# Patient Record
Sex: Male | Born: 1955 | Race: White | Hispanic: No | Smoking: Former smoker
Health system: Southern US, Community
[De-identification: ages and names within clinical notes are randomized; demographics above are authoritative.]

## PROBLEM LIST (undated history)

## (undated) DIAGNOSIS — G4730 Sleep apnea, unspecified: Secondary | ICD-10-CM

## (undated) DIAGNOSIS — K219 Gastro-esophageal reflux disease without esophagitis: Secondary | ICD-10-CM

## (undated) DIAGNOSIS — M797 Fibromyalgia: Secondary | ICD-10-CM

## (undated) DIAGNOSIS — I471 Supraventricular tachycardia, unspecified: Secondary | ICD-10-CM

## (undated) DIAGNOSIS — I2510 Atherosclerotic heart disease of native coronary artery without angina pectoris: Secondary | ICD-10-CM

## (undated) DIAGNOSIS — E785 Hyperlipidemia, unspecified: Secondary | ICD-10-CM

## (undated) DIAGNOSIS — M542 Cervicalgia: Secondary | ICD-10-CM

## (undated) DIAGNOSIS — C649 Malignant neoplasm of unspecified kidney, except renal pelvis: Secondary | ICD-10-CM

## (undated) DIAGNOSIS — G4700 Insomnia, unspecified: Secondary | ICD-10-CM

## (undated) DIAGNOSIS — G8929 Other chronic pain: Secondary | ICD-10-CM

## (undated) DIAGNOSIS — I10 Essential (primary) hypertension: Secondary | ICD-10-CM

## (undated) DIAGNOSIS — Z8249 Family history of ischemic heart disease and other diseases of the circulatory system: Secondary | ICD-10-CM

## (undated) DIAGNOSIS — G894 Chronic pain syndrome: Secondary | ICD-10-CM

## (undated) DIAGNOSIS — Z9289 Personal history of other medical treatment: Secondary | ICD-10-CM

## (undated) HISTORY — DX: Sleep apnea, unspecified: G47.30

## (undated) HISTORY — DX: Family history of ischemic heart disease and other diseases of the circulatory system: Z82.49

## (undated) HISTORY — PX: OTHER SURGICAL HISTORY: SHX169

## (undated) HISTORY — PX: INNER EAR SURGERY: SHX679

## (undated) HISTORY — DX: Gastro-esophageal reflux disease without esophagitis: K21.9

## (undated) HISTORY — PX: NEPHRECTOMY: SHX65

## (undated) HISTORY — PX: CHOLECYSTECTOMY: SHX55

## (undated) HISTORY — DX: Essential (primary) hypertension: I10

## (undated) HISTORY — PX: NASAL SINUS SURGERY: SHX719

## (undated) HISTORY — DX: Supraventricular tachycardia: I47.1

## (undated) HISTORY — DX: Hyperlipidemia, unspecified: E78.5

## (undated) HISTORY — DX: Supraventricular tachycardia, unspecified: I47.10

## (undated) HISTORY — DX: Malignant neoplasm of unspecified kidney, except renal pelvis: C64.9

## (undated) HISTORY — PX: TONSILLECTOMY: SUR1361

## (undated) HISTORY — PX: UVULOPALATOPHARYNGOPLASTY: SHX827

---

## 2004-12-06 ENCOUNTER — Ambulatory Visit: Payer: Self-pay | Admitting: Unknown Physician Specialty

## 2005-01-24 ENCOUNTER — Encounter: Payer: Self-pay | Admitting: Family Medicine

## 2005-01-24 ENCOUNTER — Ambulatory Visit: Payer: Self-pay | Admitting: Unknown Physician Specialty

## 2006-02-10 ENCOUNTER — Inpatient Hospital Stay: Payer: Self-pay | Admitting: Internal Medicine

## 2006-02-10 ENCOUNTER — Other Ambulatory Visit: Payer: Self-pay

## 2006-02-11 ENCOUNTER — Other Ambulatory Visit: Payer: Self-pay

## 2006-02-12 ENCOUNTER — Other Ambulatory Visit: Payer: Self-pay

## 2006-07-30 ENCOUNTER — Ambulatory Visit: Payer: Self-pay | Admitting: Internal Medicine

## 2007-05-03 ENCOUNTER — Ambulatory Visit: Payer: Self-pay | Admitting: Unknown Physician Specialty

## 2007-07-29 ENCOUNTER — Encounter: Payer: Self-pay | Admitting: Family Medicine

## 2007-09-14 ENCOUNTER — Other Ambulatory Visit: Payer: Self-pay

## 2007-09-14 ENCOUNTER — Emergency Department: Payer: Self-pay | Admitting: Emergency Medicine

## 2008-03-23 ENCOUNTER — Ambulatory Visit (HOSPITAL_COMMUNITY): Payer: Self-pay | Admitting: Psychiatry

## 2008-04-06 ENCOUNTER — Ambulatory Visit (HOSPITAL_COMMUNITY): Payer: Self-pay | Admitting: Psychiatry

## 2008-05-21 ENCOUNTER — Ambulatory Visit (HOSPITAL_COMMUNITY): Payer: Self-pay | Admitting: Psychiatry

## 2008-07-13 ENCOUNTER — Ambulatory Visit: Payer: Self-pay | Admitting: Family Medicine

## 2008-07-13 DIAGNOSIS — I10 Essential (primary) hypertension: Secondary | ICD-10-CM

## 2008-07-13 DIAGNOSIS — E785 Hyperlipidemia, unspecified: Secondary | ICD-10-CM

## 2008-07-13 DIAGNOSIS — IMO0001 Reserved for inherently not codable concepts without codable children: Secondary | ICD-10-CM | POA: Insufficient documentation

## 2008-07-13 DIAGNOSIS — E1121 Type 2 diabetes mellitus with diabetic nephropathy: Secondary | ICD-10-CM

## 2008-07-13 LAB — CONVERTED CEMR LAB
Bilirubin Urine: NEGATIVE
Ketones, urine, test strip: NEGATIVE
Nitrite: NEGATIVE
Protein, U semiquant: NEGATIVE
Urobilinogen, UA: NEGATIVE
WBC Urine, dipstick: NEGATIVE

## 2008-07-14 ENCOUNTER — Encounter (INDEPENDENT_AMBULATORY_CARE_PROVIDER_SITE_OTHER): Payer: Self-pay | Admitting: *Deleted

## 2008-07-14 ENCOUNTER — Encounter: Payer: Self-pay | Admitting: Family Medicine

## 2008-07-16 ENCOUNTER — Telehealth (INDEPENDENT_AMBULATORY_CARE_PROVIDER_SITE_OTHER): Payer: Self-pay | Admitting: *Deleted

## 2008-07-16 ENCOUNTER — Telehealth: Payer: Self-pay | Admitting: Family Medicine

## 2008-07-16 ENCOUNTER — Encounter (INDEPENDENT_AMBULATORY_CARE_PROVIDER_SITE_OTHER): Payer: Self-pay | Admitting: *Deleted

## 2008-07-16 LAB — CONVERTED CEMR LAB
ALT: 23 units/L (ref 0–53)
AST: 28 units/L (ref 0–37)
CO2: 29 meq/L (ref 19–32)
CRP, High Sensitivity: 4 (ref 0.00–5.00)
Chloride: 106 meq/L (ref 96–112)
Creatinine, Ser: 0.9 mg/dL (ref 0.4–1.5)
Glucose, Bld: 86 mg/dL (ref 70–99)
Microalb Creat Ratio: 26.2 mg/g (ref 0.0–30.0)
Potassium: 4 meq/L (ref 3.5–5.1)
Rhuematoid fact SerPl-aCnc: 53.5 intl units/mL — ABNORMAL HIGH (ref 0.0–20.0)
TSH: 1.29 microintl units/mL (ref 0.35–5.50)
Total Bilirubin: 0.7 mg/dL (ref 0.3–1.2)
Total CHOL/HDL Ratio: 5.6
Triglycerides: 386 mg/dL (ref 0–149)
VLDL: 77 mg/dL — ABNORMAL HIGH (ref 0–40)

## 2008-07-17 ENCOUNTER — Telehealth: Payer: Self-pay | Admitting: Family Medicine

## 2008-07-17 ENCOUNTER — Encounter (INDEPENDENT_AMBULATORY_CARE_PROVIDER_SITE_OTHER): Payer: Self-pay | Admitting: *Deleted

## 2008-07-24 ENCOUNTER — Telehealth: Payer: Self-pay | Admitting: Family Medicine

## 2008-08-03 ENCOUNTER — Telehealth (INDEPENDENT_AMBULATORY_CARE_PROVIDER_SITE_OTHER): Payer: Self-pay | Admitting: *Deleted

## 2008-08-13 ENCOUNTER — Telehealth (INDEPENDENT_AMBULATORY_CARE_PROVIDER_SITE_OTHER): Payer: Self-pay | Admitting: *Deleted

## 2008-08-28 ENCOUNTER — Telehealth (INDEPENDENT_AMBULATORY_CARE_PROVIDER_SITE_OTHER): Payer: Self-pay | Admitting: *Deleted

## 2008-09-18 ENCOUNTER — Telehealth: Payer: Self-pay | Admitting: Family Medicine

## 2008-09-25 ENCOUNTER — Encounter (INDEPENDENT_AMBULATORY_CARE_PROVIDER_SITE_OTHER): Payer: Self-pay | Admitting: *Deleted

## 2008-10-05 ENCOUNTER — Telehealth (INDEPENDENT_AMBULATORY_CARE_PROVIDER_SITE_OTHER): Payer: Self-pay | Admitting: *Deleted

## 2008-10-06 ENCOUNTER — Telehealth (INDEPENDENT_AMBULATORY_CARE_PROVIDER_SITE_OTHER): Payer: Self-pay | Admitting: *Deleted

## 2008-10-13 ENCOUNTER — Telehealth (INDEPENDENT_AMBULATORY_CARE_PROVIDER_SITE_OTHER): Payer: Self-pay | Admitting: *Deleted

## 2008-10-29 ENCOUNTER — Telehealth: Payer: Self-pay | Admitting: Family Medicine

## 2008-11-23 ENCOUNTER — Ambulatory Visit: Payer: Self-pay | Admitting: Family Medicine

## 2008-11-24 ENCOUNTER — Telehealth (INDEPENDENT_AMBULATORY_CARE_PROVIDER_SITE_OTHER): Payer: Self-pay | Admitting: *Deleted

## 2008-11-24 ENCOUNTER — Encounter: Payer: Self-pay | Admitting: Family Medicine

## 2008-11-25 LAB — CONVERTED CEMR LAB
ALT: 29 units/L (ref 0–53)
Albumin: 4.2 g/dL (ref 3.5–5.2)
CO2: 31 meq/L (ref 19–32)
Chloride: 106 meq/L (ref 96–112)
Cholesterol: 201 mg/dL — ABNORMAL HIGH (ref 0–200)
Direct LDL: 125.9 mg/dL
Eosinophils Relative: 3.5 % (ref 0.0–5.0)
HCT: 39.3 % (ref 39.0–52.0)
HDL: 28.9 mg/dL — ABNORMAL LOW (ref >39.00)
Hemoglobin: 13.9 g/dL (ref 13.0–17.0)
Lymphocytes Relative: 57.7 % — ABNORMAL HIGH (ref 12.0–46.0)
MCHC: 35.4 g/dL (ref 30.0–36.0)
MCV: 91.3 fL (ref 78.0–100.0)
Neutrophils Relative %: 29.2 % — ABNORMAL LOW (ref 43.0–77.0)
PSA: 0.35 ng/mL (ref 0.10–4.00)
RDW: 12.9 % (ref 11.5–14.6)
Sodium: 141 meq/L (ref 135–145)
TSH: 1.48 microintl units/mL (ref 0.35–5.50)
Total Bilirubin: 0.6 mg/dL (ref 0.3–1.2)
Total CHOL/HDL Ratio: 7
Total Protein: 7.1 g/dL (ref 6.0–8.3)
Triglycerides: 398 mg/dL — ABNORMAL HIGH (ref 0.0–149.0)
Vit D, 25-Hydroxy: 28 ng/mL — ABNORMAL LOW (ref 30–89)

## 2008-11-26 ENCOUNTER — Encounter (INDEPENDENT_AMBULATORY_CARE_PROVIDER_SITE_OTHER): Payer: Self-pay | Admitting: *Deleted

## 2008-12-08 ENCOUNTER — Telehealth (INDEPENDENT_AMBULATORY_CARE_PROVIDER_SITE_OTHER): Payer: Self-pay | Admitting: *Deleted

## 2008-12-08 DIAGNOSIS — F988 Other specified behavioral and emotional disorders with onset usually occurring in childhood and adolescence: Secondary | ICD-10-CM | POA: Insufficient documentation

## 2008-12-10 ENCOUNTER — Telehealth (INDEPENDENT_AMBULATORY_CARE_PROVIDER_SITE_OTHER): Payer: Self-pay | Admitting: *Deleted

## 2008-12-11 ENCOUNTER — Encounter: Payer: Self-pay | Admitting: Family Medicine

## 2008-12-28 ENCOUNTER — Telehealth (INDEPENDENT_AMBULATORY_CARE_PROVIDER_SITE_OTHER): Payer: Self-pay | Admitting: *Deleted

## 2009-01-29 ENCOUNTER — Telehealth (INDEPENDENT_AMBULATORY_CARE_PROVIDER_SITE_OTHER): Payer: Self-pay | Admitting: *Deleted

## 2009-02-02 ENCOUNTER — Telehealth (INDEPENDENT_AMBULATORY_CARE_PROVIDER_SITE_OTHER): Payer: Self-pay | Admitting: *Deleted

## 2009-02-03 ENCOUNTER — Telehealth (INDEPENDENT_AMBULATORY_CARE_PROVIDER_SITE_OTHER): Payer: Self-pay | Admitting: *Deleted

## 2009-02-08 ENCOUNTER — Telehealth (INDEPENDENT_AMBULATORY_CARE_PROVIDER_SITE_OTHER): Payer: Self-pay | Admitting: *Deleted

## 2009-02-08 ENCOUNTER — Encounter: Payer: Self-pay | Admitting: Family Medicine

## 2009-03-01 ENCOUNTER — Ambulatory Visit: Payer: Self-pay | Admitting: Internal Medicine

## 2009-03-10 ENCOUNTER — Telehealth (INDEPENDENT_AMBULATORY_CARE_PROVIDER_SITE_OTHER): Payer: Self-pay | Admitting: *Deleted

## 2009-03-23 ENCOUNTER — Telehealth (INDEPENDENT_AMBULATORY_CARE_PROVIDER_SITE_OTHER): Payer: Self-pay | Admitting: *Deleted

## 2009-03-24 ENCOUNTER — Encounter: Payer: Self-pay | Admitting: Family Medicine

## 2009-03-24 ENCOUNTER — Telehealth: Payer: Self-pay | Admitting: Family Medicine

## 2009-04-07 ENCOUNTER — Telehealth (INDEPENDENT_AMBULATORY_CARE_PROVIDER_SITE_OTHER): Payer: Self-pay | Admitting: *Deleted

## 2009-04-30 ENCOUNTER — Telehealth: Payer: Self-pay | Admitting: Family Medicine

## 2009-05-10 ENCOUNTER — Ambulatory Visit: Payer: Self-pay | Admitting: Family Medicine

## 2009-05-10 ENCOUNTER — Telehealth (INDEPENDENT_AMBULATORY_CARE_PROVIDER_SITE_OTHER): Payer: Self-pay | Admitting: *Deleted

## 2009-05-10 ENCOUNTER — Encounter: Payer: Self-pay | Admitting: Family Medicine

## 2009-05-10 DIAGNOSIS — R079 Chest pain, unspecified: Secondary | ICD-10-CM

## 2009-05-10 DIAGNOSIS — M549 Dorsalgia, unspecified: Secondary | ICD-10-CM | POA: Insufficient documentation

## 2009-05-10 DIAGNOSIS — F411 Generalized anxiety disorder: Secondary | ICD-10-CM | POA: Insufficient documentation

## 2009-05-11 ENCOUNTER — Telehealth (INDEPENDENT_AMBULATORY_CARE_PROVIDER_SITE_OTHER): Payer: Self-pay | Admitting: *Deleted

## 2009-05-17 ENCOUNTER — Telehealth (INDEPENDENT_AMBULATORY_CARE_PROVIDER_SITE_OTHER): Payer: Self-pay | Admitting: *Deleted

## 2009-05-19 ENCOUNTER — Telehealth (INDEPENDENT_AMBULATORY_CARE_PROVIDER_SITE_OTHER): Payer: Self-pay | Admitting: *Deleted

## 2009-05-24 ENCOUNTER — Ambulatory Visit: Payer: Self-pay | Admitting: Family Medicine

## 2009-05-24 LAB — CONVERTED CEMR LAB
Protein, U semiquant: NEGATIVE
Specific Gravity, Urine: <1.005
WBC Urine, dipstick: NEGATIVE

## 2009-05-28 ENCOUNTER — Telehealth: Payer: Self-pay | Admitting: Family Medicine

## 2009-06-02 ENCOUNTER — Encounter (INDEPENDENT_AMBULATORY_CARE_PROVIDER_SITE_OTHER): Payer: Self-pay | Admitting: *Deleted

## 2009-06-02 LAB — CONVERTED CEMR LAB
ALT: 29 units/L (ref 0–53)
Albumin: 4.3 g/dL (ref 3.5–5.2)
Basophils Absolute: 0 10*3/uL (ref 0.0–0.1)
Basophils Relative: 0.1 % (ref 0.0–3.0)
Bilirubin, Direct: 0 mg/dL (ref 0.0–0.3)
Chloride: 102 meq/L (ref 96–112)
Creatinine, Ser: 1 mg/dL (ref 0.4–1.5)
Creatinine,U: 54.6 mg/dL
Eosinophils Absolute: 0.2 10*3/uL (ref 0.0–0.7)
Glucose, Bld: 87 mg/dL (ref 70–99)
HDL: 36.9 mg/dL — ABNORMAL LOW (ref >39.00)
LDL Cholesterol: 74 mg/dL (ref 0–99)
Lymphocytes Relative: 26.7 % (ref 12.0–46.0)
Neutro Abs: 6 10*3/uL (ref 1.4–7.7)
Neutrophils Relative %: 66.1 % (ref 43.0–77.0)
Platelets: 145 10*3/uL — ABNORMAL LOW (ref 150.0–400.0)
Potassium: 3.7 meq/L (ref 3.5–5.1)
RBC: 4.29 M/uL (ref 4.22–5.81)
RDW: 12.1 % (ref 11.5–14.6)
Total Bilirubin: 0.6 mg/dL (ref 0.3–1.2)
Total CHOL/HDL Ratio: 4
Total Protein: 7.3 g/dL (ref 6.0–8.3)
Triglycerides: 191 mg/dL — ABNORMAL HIGH (ref 0.0–149.0)
WBC: 9.2 10*3/uL (ref 4.5–10.5)

## 2009-06-15 ENCOUNTER — Telehealth: Payer: Self-pay | Admitting: Family Medicine

## 2009-07-13 ENCOUNTER — Telehealth: Payer: Self-pay | Admitting: Family Medicine

## 2009-07-13 ENCOUNTER — Telehealth (INDEPENDENT_AMBULATORY_CARE_PROVIDER_SITE_OTHER): Payer: Self-pay | Admitting: *Deleted

## 2009-08-13 ENCOUNTER — Telehealth (INDEPENDENT_AMBULATORY_CARE_PROVIDER_SITE_OTHER): Payer: Self-pay | Admitting: *Deleted

## 2009-08-13 ENCOUNTER — Ambulatory Visit: Payer: Self-pay | Admitting: Family Medicine

## 2009-08-13 DIAGNOSIS — G4733 Obstructive sleep apnea (adult) (pediatric): Secondary | ICD-10-CM

## 2009-08-13 DIAGNOSIS — F341 Dysthymic disorder: Secondary | ICD-10-CM

## 2009-08-17 ENCOUNTER — Telehealth (INDEPENDENT_AMBULATORY_CARE_PROVIDER_SITE_OTHER): Payer: Self-pay | Admitting: *Deleted

## 2009-08-17 LAB — CONVERTED CEMR LAB
ALT: 21 units/L (ref 0–53)
AST: 18 units/L (ref 0–37)
Alkaline Phosphatase: 60 units/L (ref 39–117)
Basophils Absolute: 0 10*3/uL (ref 0.0–0.1)
Calcium: 9.6 mg/dL (ref 8.4–10.5)
Chloride: 101 meq/L (ref 96–112)
Creatinine, Ser: 0.81 mg/dL (ref 0.40–1.50)
Eosinophils Absolute: 0.2 10*3/uL (ref 0.0–0.7)
Eosinophils Relative: 2 % (ref 0–5)
Glucose, Bld: 76 mg/dL (ref 70–99)
MCV: 89.8 fL (ref 78.0–100.0)
Microalb, Ur: 8.18 mg/dL — ABNORMAL HIGH (ref 0.00–1.89)
Monocytes Relative: 7 % (ref 3–12)
Platelets: 139 10*3/uL — ABNORMAL LOW (ref 150–400)
Potassium: 3.9 meq/L (ref 3.5–5.3)
RBC: 5.01 M/uL (ref 4.22–5.81)
RDW: 13.1 % (ref 11.5–15.5)
Sodium: 139 meq/L (ref 135–145)
Total CHOL/HDL Ratio: 6.6
Triglycerides: 565 mg/dL — ABNORMAL HIGH (ref <150)
WBC: 9.4 10*3/uL (ref 4.0–10.5)

## 2009-08-18 ENCOUNTER — Encounter (INDEPENDENT_AMBULATORY_CARE_PROVIDER_SITE_OTHER): Payer: Self-pay | Admitting: *Deleted

## 2009-09-02 ENCOUNTER — Telehealth: Payer: Self-pay | Admitting: Family Medicine

## 2009-09-10 ENCOUNTER — Telehealth: Payer: Self-pay | Admitting: Family Medicine

## 2009-09-10 ENCOUNTER — Encounter: Payer: Self-pay | Admitting: Family Medicine

## 2009-09-13 ENCOUNTER — Telehealth (INDEPENDENT_AMBULATORY_CARE_PROVIDER_SITE_OTHER): Payer: Self-pay | Admitting: *Deleted

## 2009-09-13 ENCOUNTER — Ambulatory Visit: Payer: Self-pay | Admitting: Family Medicine

## 2009-09-13 ENCOUNTER — Telehealth: Payer: Self-pay | Admitting: Family Medicine

## 2009-09-13 ENCOUNTER — Encounter (INDEPENDENT_AMBULATORY_CARE_PROVIDER_SITE_OTHER): Payer: Self-pay | Admitting: *Deleted

## 2009-09-13 DIAGNOSIS — Z8679 Personal history of other diseases of the circulatory system: Secondary | ICD-10-CM | POA: Insufficient documentation

## 2009-09-13 DIAGNOSIS — R809 Proteinuria, unspecified: Secondary | ICD-10-CM

## 2009-09-13 DIAGNOSIS — C649 Malignant neoplasm of unspecified kidney, except renal pelvis: Secondary | ICD-10-CM

## 2009-09-13 LAB — CONVERTED CEMR LAB
Ketones, urine, test strip: NEGATIVE
Nitrite: NEGATIVE
Protein, U semiquant: NEGATIVE
Urobilinogen, UA: 0.2

## 2009-09-17 ENCOUNTER — Telehealth (INDEPENDENT_AMBULATORY_CARE_PROVIDER_SITE_OTHER): Payer: Self-pay | Admitting: *Deleted

## 2009-09-20 ENCOUNTER — Telehealth (INDEPENDENT_AMBULATORY_CARE_PROVIDER_SITE_OTHER): Payer: Self-pay | Admitting: *Deleted

## 2009-09-20 ENCOUNTER — Telehealth: Payer: Self-pay | Admitting: Family Medicine

## 2009-09-27 ENCOUNTER — Ambulatory Visit: Payer: Self-pay | Admitting: Cardiology

## 2009-09-27 ENCOUNTER — Telehealth (INDEPENDENT_AMBULATORY_CARE_PROVIDER_SITE_OTHER): Payer: Self-pay | Admitting: *Deleted

## 2009-09-29 ENCOUNTER — Telehealth (INDEPENDENT_AMBULATORY_CARE_PROVIDER_SITE_OTHER): Payer: Self-pay | Admitting: *Deleted

## 2009-09-30 ENCOUNTER — Telehealth (INDEPENDENT_AMBULATORY_CARE_PROVIDER_SITE_OTHER): Payer: Self-pay | Admitting: *Deleted

## 2009-09-30 ENCOUNTER — Telehealth: Payer: Self-pay | Admitting: Family Medicine

## 2009-10-15 ENCOUNTER — Telehealth (INDEPENDENT_AMBULATORY_CARE_PROVIDER_SITE_OTHER): Payer: Self-pay | Admitting: *Deleted

## 2009-10-15 ENCOUNTER — Telehealth: Payer: Self-pay | Admitting: Family Medicine

## 2009-10-19 ENCOUNTER — Telehealth (INDEPENDENT_AMBULATORY_CARE_PROVIDER_SITE_OTHER): Payer: Self-pay | Admitting: *Deleted

## 2009-11-10 ENCOUNTER — Telehealth (INDEPENDENT_AMBULATORY_CARE_PROVIDER_SITE_OTHER): Payer: Self-pay | Admitting: *Deleted

## 2009-11-11 ENCOUNTER — Telehealth (INDEPENDENT_AMBULATORY_CARE_PROVIDER_SITE_OTHER): Payer: Self-pay | Admitting: *Deleted

## 2009-11-15 ENCOUNTER — Encounter (INDEPENDENT_AMBULATORY_CARE_PROVIDER_SITE_OTHER): Payer: Self-pay | Admitting: *Deleted

## 2009-11-15 ENCOUNTER — Ambulatory Visit: Payer: Self-pay | Admitting: Family Medicine

## 2009-11-15 DIAGNOSIS — N529 Male erectile dysfunction, unspecified: Secondary | ICD-10-CM | POA: Insufficient documentation

## 2009-11-15 DIAGNOSIS — J029 Acute pharyngitis, unspecified: Secondary | ICD-10-CM

## 2009-11-17 ENCOUNTER — Telehealth (INDEPENDENT_AMBULATORY_CARE_PROVIDER_SITE_OTHER): Payer: Self-pay | Admitting: *Deleted

## 2009-11-17 LAB — CONVERTED CEMR LAB
ALT: 36 units/L (ref 0–53)
Alkaline Phosphatase: 57 units/L (ref 39–117)
Basophils Relative: 0.5 % (ref 0.0–3.0)
Bilirubin, Direct: 0.1 mg/dL (ref 0.0–0.3)
Calcium: 10.1 mg/dL (ref 8.4–10.5)
Chloride: 100 meq/L (ref 96–112)
Cholesterol: 198 mg/dL (ref 0–200)
Creatinine, Ser: 1.1 mg/dL (ref 0.4–1.5)
Direct LDL: 103 mg/dL
Eosinophils Relative: 2.5 % (ref 0.0–5.0)
GFR calc non Af Amer: 78.84 mL/min (ref >60)
HDL: 34.9 mg/dL — ABNORMAL LOW (ref >39.00)
Lymphocytes Relative: 34.6 % (ref 12.0–46.0)
Microalb Creat Ratio: 2.2 mg/g (ref 0.0–30.0)
Monocytes Relative: 5.3 % (ref 3.0–12.0)
Neutrophils Relative %: 57.1 % (ref 43.0–77.0)
PSA: 0.42 ng/mL (ref 0.10–4.00)
Platelets: 192 10*3/uL (ref 150.0–400.0)
RBC: 4.61 M/uL (ref 4.22–5.81)
Total Bilirubin: 0.4 mg/dL (ref 0.3–1.2)
Total CHOL/HDL Ratio: 6
Total Protein: 7.6 g/dL (ref 6.0–8.3)
Triglycerides: 367 mg/dL — ABNORMAL HIGH (ref 0.0–149.0)
VLDL: 73.4 mg/dL — ABNORMAL HIGH (ref 0.0–40.0)
WBC: 7.1 10*3/uL (ref 4.5–10.5)

## 2009-12-13 ENCOUNTER — Encounter: Payer: Self-pay | Admitting: Family Medicine

## 2009-12-14 ENCOUNTER — Ambulatory Visit: Payer: Self-pay | Admitting: Family Medicine

## 2009-12-14 ENCOUNTER — Telehealth: Payer: Self-pay | Admitting: Family Medicine

## 2010-01-20 ENCOUNTER — Telehealth (INDEPENDENT_AMBULATORY_CARE_PROVIDER_SITE_OTHER): Payer: Self-pay | Admitting: *Deleted

## 2010-01-27 ENCOUNTER — Telehealth (INDEPENDENT_AMBULATORY_CARE_PROVIDER_SITE_OTHER): Payer: Self-pay | Admitting: *Deleted

## 2010-02-08 ENCOUNTER — Telehealth: Payer: Self-pay | Admitting: Family Medicine

## 2010-03-20 ENCOUNTER — Encounter: Payer: Self-pay | Admitting: Family Medicine

## 2010-03-28 ENCOUNTER — Ambulatory Visit: Payer: Self-pay | Admitting: Family Medicine

## 2010-03-28 DIAGNOSIS — L0291 Cutaneous abscess, unspecified: Secondary | ICD-10-CM

## 2010-03-28 DIAGNOSIS — L0390 Cellulitis, unspecified: Secondary | ICD-10-CM

## 2010-03-28 LAB — CONVERTED CEMR LAB
Bilirubin Urine: NEGATIVE
Ketones, urine, test strip: NEGATIVE
Nitrite: NEGATIVE
Specific Gravity, Urine: 1.01

## 2010-03-29 ENCOUNTER — Telehealth (INDEPENDENT_AMBULATORY_CARE_PROVIDER_SITE_OTHER): Payer: Self-pay | Admitting: *Deleted

## 2010-04-11 ENCOUNTER — Telehealth (INDEPENDENT_AMBULATORY_CARE_PROVIDER_SITE_OTHER): Payer: Self-pay | Admitting: *Deleted

## 2010-04-18 ENCOUNTER — Ambulatory Visit: Payer: Self-pay | Admitting: Internal Medicine

## 2010-04-18 DIAGNOSIS — J069 Acute upper respiratory infection, unspecified: Secondary | ICD-10-CM

## 2010-04-22 ENCOUNTER — Telehealth: Payer: Self-pay | Admitting: Family Medicine

## 2010-04-25 ENCOUNTER — Encounter: Payer: Self-pay | Admitting: Family Medicine

## 2010-04-26 ENCOUNTER — Encounter: Payer: Self-pay | Admitting: Family Medicine

## 2010-04-29 ENCOUNTER — Telehealth: Payer: Self-pay | Admitting: Family Medicine

## 2010-05-23 ENCOUNTER — Telehealth: Payer: Self-pay | Admitting: Family Medicine

## 2010-05-23 ENCOUNTER — Telehealth (INDEPENDENT_AMBULATORY_CARE_PROVIDER_SITE_OTHER): Payer: Self-pay | Admitting: *Deleted

## 2010-05-24 ENCOUNTER — Telehealth: Payer: Self-pay | Admitting: Family Medicine

## 2010-05-26 ENCOUNTER — Telehealth (INDEPENDENT_AMBULATORY_CARE_PROVIDER_SITE_OTHER): Payer: Self-pay | Admitting: *Deleted

## 2010-06-22 ENCOUNTER — Telehealth: Payer: Self-pay | Admitting: Family Medicine

## 2010-07-07 ENCOUNTER — Telehealth: Payer: Self-pay | Admitting: Family Medicine

## 2010-07-07 ENCOUNTER — Telehealth (INDEPENDENT_AMBULATORY_CARE_PROVIDER_SITE_OTHER): Payer: Self-pay | Admitting: *Deleted

## 2010-07-14 ENCOUNTER — Telehealth: Payer: Self-pay | Admitting: Family Medicine

## 2010-07-25 ENCOUNTER — Ambulatory Visit
Admission: RE | Admit: 2010-07-25 | Discharge: 2010-07-25 | Payer: Self-pay | Source: Home / Self Care | Attending: Family Medicine | Admitting: Family Medicine

## 2010-07-25 DIAGNOSIS — L708 Other acne: Secondary | ICD-10-CM | POA: Insufficient documentation

## 2010-07-25 DIAGNOSIS — D239 Other benign neoplasm of skin, unspecified: Secondary | ICD-10-CM | POA: Insufficient documentation

## 2010-08-09 NOTE — Progress Notes (Signed)
Summary: need rx for neuropathy/pt will call back with dosage  Phone Note Call from Patient   Caller: Patient Summary of Call: c/o problem with foot stinging, burning, legs drawing up at night --diabetic doc put me on neurontin, would like to know if Dr Laury Axon could write me a rx so I could get me some sleep left msg for pt to call name of diabetic doc? Marland KitchenKandice Hams  October 19, 2009 1:11 PM Spoke  with pt who says Dr Laury Axon is treating my diabetes now, has been on Neurontin in the past from a former doctor Dr Robie Ridge who I am not seeing anymore. --Stinging at night time getting worse at night with burning when I take my shoes off at night, I had 2 pills left and took 1 and it did help, pt says he has neuropathy. last ov was 09/13/09. [facility] use [facility] in [place], call pt when med is called in  Initial call taken by: Kandice Hams,  October 20, 2009 10:35 AM  Follow-up for Phone Call        NEED DOSE Patient threw away bottle will need to call back .Kandice Hams  October 20, 2009 12:52 PM  Follow-up by: Loreen Freud DO,  October 20, 2009 10:45 AM  Additional Follow-up for Phone Call Additional follow up Details #1::        If he remembers pharmacy we can find out from pharmacy Additional Follow-up by: Loreen Freud DO,  October 20, 2009 3:16 PM    Additional Follow-up for Phone Call Additional follow up Details #2::    Called [facility] , pt never filled neurontin there. Per Dr Laury Axon fill Neurontin 300mg  1 by mouth hs x 1 week,then increase to two times a day, can use three times a day.  If pt uses three times a day he needs to call Rx  faxed to [facility], pt informed .Kandice Hams  October 21, 2009 9:40 AM  Follow-up by: Kandice Hams,  October 21, 2009 9:40 AM  New/Updated Medications: GABAPENTIN 300 MG CAPS (GABAPENTIN) 1 by mouth at night for 1 week then increase to two times a day can use up to three times a day Prescriptions: GABAPENTIN 300 MG CAPS (GABAPENTIN) 1 by mouth at night  for 1 week then increase to two times a day can use up to three times a day  #60 x 1   Entered by:   Kandice Hams   Authorized by:   Loreen Freud DO   Signed by:   Kandice Hams on 10/21/2009   Method used:   Faxed to ...       [facility] [address] [facility] ([facility]ail)       [address]       [place], [place]  [address]       Ph: [phone]       Fax: [phone]   RxID:   [id number]

## 2010-08-09 NOTE — Assessment & Plan Note (Signed)
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  Meds will need to be changed.  Pt now willing to go to ortho for back and needs f/u for sleep apnea.     Current Medications (verified): 1)  Metoprolol Tartrate 25 Mg Tabs (Metoprolol Tartrate) .... 1/2 Tablet By Mouth Twice Daily 2)  Lisinopril-Hydrochlorothiazide 20-25 Mg Tabs (Lisinopril-Hydrochlorothiazide) .Marland Kitchen.. 1 By Mouth Once Daily 3)  Omeprazole 40 Mg Cpdr (Omeprazole) .... Take 1 Tab  Once Daily 4)  Aspirin Ec 81 Mg Tbec (Aspirin) .... Take 1 Tab Once Daily 5)  Pravachol 40 Mg Tabs (Pravastatin Sodium) .Marland Kitchen.. 1 By Mouth At Bedtime 6)  Metformin Hcl 500 Mg Tabs (Metformin Hcl) .... Take 1 Tab Two Times A Day 7)  Celexa 40 Mg Tabs (Citalopram Hydrobromide) .... By Mouth Once Daily 8)  Flonase 50 Mcg/act Susp (Fluticasone Propionate) .... 2 Sprays Each Nostril Once Daily 9)  Adderall 20 Mg Tabs (Amphetamine-Dextroamphetamine) .Marland Kitchen.. 1 By Mouth Once Daily 10)  Vitamin D 11914 Unit Caps (Ergocalciferol) .... Take 1 Tab Weekly 11)  Claritin 10 Mg Tabs (Loratadine) .... Once Daily 12)  Ultram 50 Mg Tabs (Tramadol Hcl) .Marland Kitchen.. 1-2  By Mouth Every 6 Hours As Needed 13)  Vitamin D 78295 Unit Caps (Ergocalciferol) .... Take One Capsule Weekly. 14)  Mobic 15 Mg Tabs (Meloxicam) .... 1/2 -1 By Mouth Once Daily As Needed 15)  Krill Oil 1000 Mg Caps (Krill Oil) .... 2 By Mouth Two Times A Day  Allergies: 1)  ! Sulfa 2)  ! Vicodin  Past History:  Past medical, surgical, family and social histories (including risk  factors) reviewed for relevance to current acute and chronic problems.  Past Medical History: Reviewed history from 07/13/2008 and no changes required. Diabetes mellitus, type II Hypertension heart disease  cancer-kidney 1999 sleep apinea cpap Hyperlipidemia  Family History: Reviewed history from 07/13/2008 and no changes required. dm-father HTN-mother,father Family History High cholesterol-father  Social History: Reviewed history from 11/23/2008 and no changes required. Occupation: truck Hospital doctor Married Former Smoker Alcohol use-yes Drug use-no Regular exercise-yes  Review of Systems      See HPI  Physical Exam  General:  Well-developed,well-nourished,in no acute distress; alert,appropriate and cooperative throughout examination Nose:  no external erythema.   Lungs:  Normal respiratory effort, chest expands symmetrically. Lungs are clear to auscultation, no crackles or wheezes. Heart:  Normal rate and regular rhythm. S1 and S2 normal without gallop, murmur, click, rub or other extra sounds. Extremities:  No clubbing, cyanosis, edema, or deformity noted with normal full range of motion of all joints.   Psych:  Oriented X3, normally interactive, and good eye contact.     Impression & Recommendations:  Problem # 1:  OBSTRUCTIVE SLEEP APNEA (ICD-327.23)  Orders: Sleep Disorder Referral (Sleep Disorder)  Problem # 2:  BACK PAIN (ICD-724.5)  The following medications were removed from the medication list:    Flexeril 10 Mg Tabs (  Cyclobenzaprine hcl) .Marland Kitchen... 1 by mouth three times a day as needed His updated medication list for this problem includes:    Aspirin Ec 81 Mg Tbec (Aspirin) .Marland Kitchen... Take 1 tab once daily    Ultram 50 Mg Tabs (Tramadol hcl) .Marland Kitchen... 1-2  by mouth every 6 hours as needed    Mobic 15 Mg Tabs (Meloxicam) .Marland Kitchen... 1/2 -1 by mouth once daily as needed  Orders: Orthopedic Surgeon Referral (Ortho Surgeon)  Discussed use of moist heat or ice, modified  activities, medications, and stretching/strengthening exercises. Back care instructions given. To be seen in 2 weeks if no improvement; sooner if worsening of symptoms.   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 1/2 tablet by mouth twice daily 2)  Lisinopril-hydrochlorothiazide 20-25 Mg Tabs (Lisinopril-hydrochlorothiazide) .Marland Kitchen.. 1 by mouth once daily 3)  Omeprazole 40 Mg Cpdr (Omeprazole) .... Take 1 tab  once daily 4)  Aspirin Ec 81 Mg Tbec (Aspirin) .... Take 1 tab once daily 5)  Pravachol 40 Mg Tabs (Pravastatin sodium) .Marland Kitchen.. 1 by mouth at bedtime 6)  Metformin Hcl 500 Mg Tabs (Metformin hcl) .... Take 1 tab two times a day 7)  Celexa 40 Mg Tabs (Citalopram hydrobromide) .... By mouth once daily 8)  Flonase 50 Mcg/act Susp (Fluticasone propionate) .... 2 sprays each nostril once daily 9)  Adderall 20 Mg Tabs (Amphetamine-dextroamphetamine) .Marland Kitchen.. 1 by mouth once daily 10)  Vitamin D 16109 Unit Caps (Ergocalciferol) .... Take 1 tab weekly 11)  Claritin 10 Mg Tabs (Loratadine) .... Once daily 12)  Ultram 50 Mg Tabs (Tramadol hcl) .Marland Kitchen.. 1-2  by mouth every 6 hours as needed 13)  Vitamin D 60454 Unit Caps (Ergocalciferol) .... Take one capsule weekly. 14)  Mobic 15 Mg Tabs (Meloxicam) .... 1/2 -1 by mouth once daily as needed 15)  Krill Oil 1000 Mg Caps (Krill oil) .... 2 by mouth two times a day  Other Orders: Venipuncture ([address]11)

## 2010-08-09 NOTE — Progress Notes (Signed)
Summary: refill  Phone Note Refill Request Message from:  Fax from Pharmacy on May 23, 2010 4:51 PM  Refills Requested: Medication #1:  PRAVACHOL 40 MG TABS 1 by mouth at bedtime [facility] - fax [phone]  Initial call taken by: Okey Regal Spring,  May 23, 2010 4:52 PM    Prescriptions: PRAVACHOL 40 MG TABS (PRAVASTATIN SODIUM) 1 by mouth at bedtime  #30 x 0   Entered by:   Almeta Monas CMA (AAMA)   Authorized by:   Loreen Freud DO   Signed by:   Almeta Monas CMA (AAMA) on 05/24/2010   Method used:   Faxed to ...       [facility] [address] [facility] ([facility])       [address]       [place], [place]  [address]       Ph: [phone]       Fax: [phone]   RxID:   [id number]

## 2010-08-09 NOTE — Progress Notes (Signed)
Summary: refill  Phone Note Refill Request Message from:  Fax from Pharmacy on May 23, 2010 10:23 AM  Refills Requested: Medication #1:  MOBIC 15 MG TABS 1/2 -1 by mouth once daily as needed [facility] [address] [facility] - [phone]  Initial call taken by: Okey Regal Spring,  May 23, 2010 10:24 AM  Follow-up for Phone Call        Please advise Rx last filled 01/11/10 and pt last seen in the office 03/28/10 by you and 04/18/10 with Dr.paz.... Please advise Follow-up by: Almeta Monas CMA Duncan Dull),  May 23, 2010 11:45 AM  Additional Follow-up for Phone Call Additional follow up Details #1::        ok to refill x1 Additional Follow-up by: Loreen Freud DO,  May 23, 2010 12:08 PM    Prescriptions: MOBIC 15 MG TABS (MELOXICAM) 1/2 -1 by mouth once daily as needed  #30 Each x 0   Entered by:   Jeremy Johann CMA   Authorized by:   Loreen Freud DO   Signed by:   Jeremy Johann CMA on 05/23/2010   Method used:   F[facility]ed to ...       [facility] [address] [facility]       [address]       [place], [place]  [address]       Ph: [phone]       Fax: [phone]   RxID:   [id number]

## 2010-08-09 NOTE — Medication Information (Signed)
Summary: Approval for Adderall/Anthem [employer]  Approval for Adderall/Anthem [employer]   Imported By: Lanelle Bal 05/10/2010 12:14:19  _____________________________________________________________________  External Attachment:    Type:   Image     Comment:   External Document

## 2010-08-09 NOTE — Progress Notes (Signed)
Summary: Prior Auth--Adderall  Phone Note Refill Request Call back at [phone] Message from:  Pharmacy on April 22, 2010 8:19 AM  Refills Requested: Medication #1:  ADDERALL 20 MG TABS 1 by mouth once daily   Dosage confirmed as above?Dosage Confirmed   Supply Requested: 1 month Prior Auth Required [phone]  Initial call taken by: Harold Barban,  April 22, 2010 8:20 AM  Follow-up for Phone Call        Form requested. Lucious Groves CMA  April 22, 2010 9:38 AM   Form completed, will await insurance company reply. Lucious Groves CMA  April 25, 2010 9:12 AM      Appended Document: Prior Auth--Adderall Prior auth approved 04-25-10 until 04-25-11, pharmacy faxed approval letter scan to chart

## 2010-08-09 NOTE — Progress Notes (Signed)
Summary: RX Confirmation  Phone Note From Pharmacy   Caller: Dawn @ [employer] of Call: She called and said she needed authrorizarion to [place] this [place] because [place]. It is for his addreall 20mg . Their number is [phone]. Thanks! Initial call taken by: Harold Barban,  October 15, 2009 2:08 PM  Follow-up for Phone Call        Verified rx with pharmacy. Army Fossa CMA  October 15, 2009 3:13 PM

## 2010-08-09 NOTE — Letter (Signed)
Summary: Work Dietitian at [employer]  [address] [place], [place] [address]   Phone: [phone]  Fax: [phone]    Today's Date: September 13, 2009  Name of Patient: Jesse Vargas  The above named patient had a medical visit today at: 9:45  am / pm.  Please take this into consideration when reviewing the time away from work/school.    Special Instructions:  [  ] None  [  ] To be off the remainder of today, returning to the normal work / school schedule tomorrow.  [  ] To be off until the next scheduled appointment on ______________________.  [  ] Other ________________________________________________________________ ________________________________________________________________________   Sincerely yours,   Loreen Freud, DO

## 2010-08-09 NOTE — Progress Notes (Signed)
Summary: urologist  Phone Note Outgoing Call   Call placed by: Army Fossa CMA,  September 13, 2009 4:39 PM Summary of Call: Spoke with pts wife and informed her that per Dr.Lowne the pt needed to follow up with his urologist in [place]. Army Fossa CMA  September 13, 2009 4:40 PM

## 2010-08-09 NOTE — Letter (Signed)
Summary: Medical Exam Form/DOT  Medical Exam Form/DOT   Imported By: Lanelle Bal 09/17/2009 08:28:59  _____________________________________________________________________  External Attachment:    Type:   Image     Comment:   External Document

## 2010-08-09 NOTE — Medication Information (Signed)
Summary: Prior Authorization & Approval for Adderall/[facility]  Prior Authorization & Approval for Adderall/[facility]   Imported By: Lanelle Bal 05/03/2010 13:57:12  _____________________________________________________________________  External Attachment:    Type:   Image     Comment:   External Document

## 2010-08-09 NOTE — Progress Notes (Signed)
Summary: RX MED  Phone Note Call from Patient Call back at Work Phone [phone]   Caller: Patient Summary of Call: PT LEFT VM THAT HE WOULD LIKE A CALL BACK AND WOULD PREFER TO TALK TO NURSE INSTEAD OF VM. left message to call office.......Marland KitchenFelecia Deloach CMA  September 02, 2009 10:17 AM   Follow-up for Phone Call        PT WAS TOLD TO SEE HIS PULMONOlOGIST FOR C-PAP BUT HE WANT TO SEE FEELING GREAT SLEEP STUDIES IN [place] AND WOULD LIKE FOR YOU TO RX C-PAP. PT WOULD LIKE TO  HAVE SLEEP STUDIES DONE THERE. PT  WOULD ALSO LIKE TO BE SET UP WITH A PSYCHIATRIC..........Marland KitchenFelecia Deloach CMA  September 02, 2009 12:04 PM   Additional Follow-up for Phone Call Additional follow up Details #1::        Our pulmonologist set up sleep studies and order the cpap--- He can talk to Dr Vassie Loll about getting test done in [place]. 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 pt has stop the clonazepam and his nerves are doing fine per pt. 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 psych  list mailed to patient....................Marland KitchenFelecia Deloach CMA  September 02, 2009 1:06 PM   Additional Follow-up for Phone Call Additional follow up Details #3:: Details for Additional Follow-up Action Taken: once pt establishes with psych--- they will fill rx  Pt is aware. Army Fossa CMA  September 02, 2009 5:08 PM  Additional Follow-up by: Loreen Freud DO,  September 02, 2009 5:07 PM

## 2010-08-09 NOTE — Assessment & Plan Note (Signed)
Summary: MEDICATIONS/RH........Marland Kitchen   Vital Signs:  Patient profile:   55 year old male Height:      70 inches Weight:      232 pounds BMI:     33.41 Temp:     98.2 degrees F oral Pulse rate:   82 / minute Pulse rhythm:   regular BP sitting:   126 / 84  (left arm) Cuff size:   large  Vitals Entered By: Army Fossa CMA (September 13, 2009 9:52 AM) CC: Pt here he had protein in his urine at his physical at work, and having a constant HA.    History of Present Illness: Pt here f/u [facility]---pt states they told him to f/u here secondary to Afib, proteinuria and adderall , flexeril and ultram use.  Pt only uses flexeril and ultram at night.  Pt does not have a fib---  pt was confused between afib and svt.  Pt had svt with cardiology but it has been controlled.  No CP, SOB.    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 1/2 Tablet By Mouth Twice Daily 2)  Lisinopril-Hydrochlorothiazide 20-25 Mg Tabs (Lisinopril-Hydrochlorothiazide) .Marland Kitchen.. 1 By Mouth Once Daily 3)  Omeprazole 40 Mg Cpdr (Omeprazole) .... Take 1 Tab  Once Daily 4)  Aspirin Ec 81 Mg Tbec (Aspirin) .... Take 1 Tab Once Daily 5)  Pravachol 40 Mg Tabs (Pravastatin Sodium) .Marland Kitchen.. 1 By Mouth At Bedtime 6)  Metformin Hcl 500 Mg Tabs (Metformin Hcl) .... Take 1 Tab Two Times A Day 7)  Celexa 40 Mg Tabs  (Citalopram Hydrobromide) .... By Mouth Once Daily 8)  Flonase 50 Mcg/act Susp (Fluticasone Propionate) .... 2 Sprays Each Nostril Once Daily 9)  Adderall 20 Mg Tabs (Amphetamine-Dextroamphetamine) .Marland Kitchen.. 1 By Mouth Once Daily 10)  Vitamin D 16109 Unit Caps (Ergocalciferol) .... Take 1 Tab Weekly 11)  Claritin 10 Mg Tabs (Loratadine) .... Once Daily 12)  Ultram 50 Mg Tabs (Tramadol Hcl) .Marland Kitchen.. 1-2  By Mouth Every 6 Hours As Needed 13)  Vitamin D 60454 Unit Caps (Ergocalciferol) .... Take One Capsule Weekly. 14)  Mobic 15 Mg Tabs (Meloxicam) .... 1/2 -1 By Mouth Once Daily As Needed 15)  Krill Oil 1000 Mg Caps (Krill Oil) .... 2 By Mouth Two Times A Day 16)  Antara 130 Mg Caps (Fenofibrate Micronized) .Marland Kitchen.. 1 By Mouth By Mouth Once Daily. 17)  Cpap 18)  Flexeril 10 Mg Tabs (Cyclobenzaprine Hcl) .Marland Kitchen.. 1 By Mouth At Bedtime As Needed 19)  Keflex 500 Mg Caps (Cephalexin) .Marland Kitchen.. 1 By Mouth Two Times A Day 20)  Onetouch Delica Lancets  Misc (Lancets) .... Accu Check Two Times A Day 21)  One Touch Ultra Mini Strips .... Bld Sugar Checks Two Times A Day  Allergies: 1)  ! Sulfa 2)  ! Vicodin  Past History:  Family History: Last updated: 07/13/2008 dm-father HTN-mother,father Family History High cholesterol-father  Social History: Last updated: 11/23/2008 Occupation: truck driver Married Former Smoker Alcohol use-yes Drug use-no Regular exercise-yes  Risk Factors: Alcohol Use: <1 (05/24/2009) Caffeine Use: 3 (05/24/2009) Exercise: yes (05/24/2009)  Risk Factors: Smoking Status: quit (05/24/2009) Passive Smoke Exposure: no (05/24/2009)  Past medical, surgical, family and social histories (including risk factors) reviewed for relevance to current acute and chronic problems.  Past Medical History: Reviewed history from 07/13/2008 and no changes required. Diabetes mellitus, type II Hypertension heart disease  cancer-kidney 1999 sleep apinea cpap Hyperlipidemia  Family  History: Reviewed history from 07/13/2008 and no changes required. dm-father HTN-mother,father Family History High cholesterol-father  Social History: Reviewed history from 11/23/2008 and no changes required. Occupation: truck Hospital doctor Married Former Smoker Alcohol use-yes Drug use-no Regular exercise-yes  Review of Systems      See HPI  Physical Exam  General:  Well-developed,well-nourished,in no acute distress; alert,appropriate and cooperative throughout examination Lungs:  Normal respiratory effort, chest expands symmetrically. Lungs are clear to auscultation, no crackles or wheezes. Heart:  normal rate and no murmur.   Extremities:  No clubbing, cyanosis, edema, or deformity noted with normal full range of motion of all joints.   Psych:  Oriented X3 and normally interactive.     Impression & Recommendations:  Problem # 1:  SUPRAVENTRICULAR TACHYCARDIA, HX OF (ICD-V12.59)  Orders: EKG w/ Interpretation (93000)  Problem # 2:  PROTEINURIA (ICD-791.0)  Orders: T-Urine 24 Hr. Protein [phone]) EKG w/ Interpretation (93000)  Problem # 3:  OBSTRUCTIVE SLEEP APNEA (ICD-327.23) cpap--per pulm  Problem # 4:  ANXIETY DEPRESSION (ICD-300.4)  Orders: EKG w/ Interpretation (93000)  Problem # 5:  ATTENTION DEFICIT DISORDER, ADULT (ICD-314.00)  Orders: EKG w/ Interpretation (93000)  Problem # 6:  HYPERLIPIDEMIA (ICD-272.4)  His updated medication list for this problem includes:    Pravachol 40 Mg Tabs (Pravastatin sodium) .Marland Kitchen... 1 by mouth at bedtime    Antara 130 Mg Caps (Fenofibrate micronized) .Marland Kitchen... 1 by mouth by mouth once daily.  Labs Reviewed: SGOT: 18 (08/13/2009)   SGPT: 21 (08/13/2009)   HDL:29 (08/13/2009), 36.90 (05/24/2009)  LDL:See Comment mg/dL (09/81/1914), 74 (78/29/5621)  Chol:190 (08/13/2009), 149 (05/24/2009)  Trig:565 (08/13/2009), 191.0 (05/24/2009)  Orders: EKG w/ Interpretation (93000)  Problem # 7:  HYPERTENSION (ICD-401.9)  His  updated medication list for this problem includes:    Metoprolol Tartrate 25 Mg Tabs (Metoprolol tartrate) .Marland Kitchen... 1/2 tablet by mouth twice daily    Lisinopril-hydrochlorothiazide 20-25 Mg Tabs (Lisinopril-hydrochlorothiazide) .Marland Kitchen... 1 by mouth once daily  BP today: 126/84 Prior BP: 124/82 (08/13/2009)  Labs Reviewed: K+: 3.9 (08/13/2009) Creat: : 0.81 (08/13/2009)   Chol: 190 (08/13/2009)   HDL: 29 (08/13/2009)   LDL: See Comment mg/dL (30/86/5784)   TG: 696 (08/13/2009)  Orders: EKG w/ Interpretation (93000)  Problem # 8:  DIABETES MELLITUS, TYPE II (ICD-250.00) Assessment: Unchanged  His updated medication list for this problem includes:    Lisinopril-hydrochlorothiazide 20-25 Mg Tabs (Lisinopril-hydrochlorothiazide) .Marland Kitchen... 1 by mouth once daily    Aspirin Ec 81 Mg Tbec (Aspirin) .Marland Kitchen... Take 1 tab once daily    Metformin Hcl 500 Mg Tabs (Metformin hcl) .Marland Kitchen... Take 1 tab two times a day  Labs Reviewed: Creat: 0.81 (08/13/2009)    Reviewed HgBA1c results: 6.0 (08/13/2009)  5.6 (05/24/2009)  Orders: EKG w/ Interpretation (93[address])  Complete Medication List: 1)  Metoprolol Tartrate 25 Mg Tabs (Metoprolol tartrate) .... 1/2 tablet by mouth twice daily 2)  Lisinopril-hydrochlorothiazide 20-25 Mg Tabs (Lisinopril-hydrochlorothiazide) .Marland Kitchen.. 1 by mouth once daily 3)  Omeprazole  40 Mg Cpdr (Omeprazole) .... Take 1 tab  once daily 4)  Aspirin Ec 81 Mg Tbec (Aspirin) .... Take 1 tab once daily 5)  Pravachol 40 Mg Tabs (Pravastatin sodium) .Marland Kitchen.. 1 by mouth at bedtime 6)  Metformin Hcl 500 Mg Tabs (Metformin hcl) .... Take 1 tab two times a day 7)  Celexa 40 Mg Tabs (Citalopram hydrobromide) .... By mouth once daily 8)  Flonase 50 Mcg/act Susp (Fluticasone propionate) .... 2 sprays each nostril once daily 9)  Adderall 20 Mg Tabs (Amphetamine-dextroamphetamine) .Marland Kitchen.. 1 by mouth once daily 10)  Vitamin D 91478 Unit Caps (Ergocalciferol) .... Take 1 tab weekly 11)  Claritin 10 Mg Tabs (Loratadine)  .... Once daily 12)  Ultram 50 Mg Tabs (Tramadol hcl) .Marland Kitchen.. 1-2  by mouth every 6 hours as needed 13)  Vitamin D 29562 Unit Caps (Ergocalciferol) .... Take one capsule weekly. 14)  Mobic 15 Mg Tabs (Meloxicam) .... 1/2 -1 by mouth once daily as needed 15)  Krill Oil 1000 Mg Caps (Krill oil) .... 2 by mouth two times a day 16)  Antara 130 Mg Caps (Fenofibrate micronized) .Marland Kitchen.. 1 by mouth by mouth once daily. 17)  Cpap  18)  Flexeril 10 Mg Tabs (Cyclobenzaprine hcl) .Marland Kitchen.. 1 by mouth at bedtime as needed 19)  Keflex 500 Mg Caps (Cephalexin) .Marland Kitchen.. 1 by mouth two times a day 20)  Onetouch Delica Lancets Misc (Lancets) .... Accu check two times a day 21)  One Touch Ultra Mini Strips  .... Bld sugar checks two times a day  Other Orders: Radiology Referral (Radiology) Prescriptions: ONE TOUCH ULTRA MINI STRIPS Bld sugar checks two times a day  #60 x 11   Entered and Authorized by:   Loreen Freud DO   Signed by:   Loreen Freud DO on 09/13/2009   Method used:   Faxed to ...       [facility] [facility] (retail)       [address]       [place], [place]  [address]       Ph: [phone]       Fax: [phone]   RxID:   [id number] Dola Argyle LANCETS  MISC (LANCETS) accu check two times a day  #60 x 11   Entered and Authorized by:   Loreen Freud DO   Signed by:   Loreen Freud DO on 09/13/2009   Method used:   Electronically to        [facility]  [address] (retail)       [address]       [place], [place]  [address]       Ph: [phone]       Fax: [phone]   RxID:   [id number] KEFLEX 500 MG CAPS (CEPHALEXIN) 1 by mouth two times a day  #20 x 0   Entered and Authorized by:   Loreen Freud DO   Signed by:   Loreen Freud DO on 09/13/2009   Method used:   Electronically to        [facility]  [address] (retail)       [address]       [place], [place]  [address]       Ph: [phone]       Fax: [phone]   RxID:  [id number] FLEXERIL 10 MG TABS (CYCLOBENZAPRINE HCL) 1 by mouth at bedtime as needed  #30 x 0   Entered and Authorized by:   Loreen Freud DO   Signed by:   Loreen Freud DO on 09/13/2009   Method used:   Historical   RxID:   [id number]   Laboratory Results   Urine Tests    Routine Urinalysis   Color: yellow Appearance: Clear Glucose: negative   (Normal Range: Negative) Bilirubin: negative   (Normal Range: Negative) Ketone: negative   (Normal Range: Negative) Spec. Gravity: 1.020   (Normal Range: 1.003-1.035) Blood: negative   (Normal Range: Negative) pH: 6.5   (Normal Range: 5.0-8.0) Protein: negative   (Normal Range: Negative) Urobilinogen: 0.2   (Normal Range: 0-1) Nitrite: negative   (Normal Range: Negative) Leukocyte Esterace: negative   (Normal Range: Negative)    Comments: Army Fossa CMA  September 13, 2009 9:59 AM      EKG  Procedure date:  09/13/2009  Findings:      Normal sinus rhythm with rate of:  73 bpm

## 2010-08-09 NOTE — Progress Notes (Signed)
Summary: Lab Results   Phone Note Outgoing Call   Call placed by: Army Fossa CMA,  August 17, 2009 5:14 PM Summary of Call: Regarding lab results, LMTCB:  TG are elevated-----  antara 130 mg #30  1 by mouth once daily , 2 refills--- give coupon with rx then it should only be $25 recheck 3 month----   NMR, hep, hgba1c, bmp 250.00,   272.4   Signed by Loreen Freud DO on 08/16/2009 at 9:34 AM  Follow-up for Phone Call        Pt is aware. Army Fossa CMA  August 18, 2009 2:18 PM     New/Updated Medications: ANTARA 130 MG CAPS (FENOFIBRATE MICRONIZED) 1 by mouth by mouth once daily. Prescriptions: ANTARA 130 MG CAPS (FENOFIBRATE MICRONIZED) 1 by mouth by mouth once daily.  #30 x 2   Entered by:   Army Fossa CMA   Authorized by:   Loreen Freud DO   Signed by:   Army Fossa CMA on 08/18/2009   Method used:   Electronically to        [facility] (retail)       [address]       [place], [place]  [address]       Ph: [phone]       Fax: [phone]   RxID:   [id number]

## 2010-08-09 NOTE — Progress Notes (Signed)
Summary: FAILED RADIOLOGY REFERRAL  Phone Note Other Incoming Call back at [phone] [facility]   Caller: [facility] [facility] Summary of Call: IN REFERENCE TO RADIOLOGY REFERRAL/CT SCAN, PER CALL FROM [facility] [place] [facility], PT WIFE JUST CALLED [facility] & CANCELLED PT'S APPT FOR TODAY, WOULD NOT RESCHEDULE, AND STATED THEY JUST DIDN'T KNOW WHEN PT WOULD BE ABLE TO HAVE DONE. Initial call taken by: Magdalen Spatz [facility],  September 20, 2009 10:48 AM  Follow-up for Phone Call        call pt to see whats going on Follow-up by: Loreen Freud DO,  September 20, 2009 12:18 PM  Additional Follow-up for Phone Call Additional follow up Details #1::        Spoke with pts wife and they are trying to figure out when his eye appt is so that he can only take off one day, She is going to call and reschedule the CT. Army Fossa CMA  September 20, 2009 1:30 PM

## 2010-08-09 NOTE — Progress Notes (Signed)
Summary: refill  Phone Note Refill Request   Refills Requested: Medication #1:  ULTRAM 50 MG TABS 1-2  by mouth every 6 hours as needed   Last Refilled: 08/10/2009 last ov- 09/13/09. Army Fossa CMA  September 13, 2009 12:00 PM   Caller: Spouse  Follow-up for Phone Call        refill x 1  Follow-up by: Loreen Freud DO,  September 13, 2009 12:29 PM    Prescriptions: ULTRAM 50 MG TABS (TRAMADOL HCL) 1-2  by mouth every 6 hours as needed  #60 Each x 0   Entered by:   Army Fossa CMA   Authorized by:   Loreen Freud DO   Signed by:   Army Fossa CMA on 09/13/2009   Method used:   Electronically to        [facility]  [address] [facility] (retail)       [address]       [place], [place]  [address]       Ph: [phone]       Fax: [phone]   RxID:   [id number]

## 2010-08-09 NOTE — Progress Notes (Signed)
Summary: Refill--Ultram  Phone Note Refill Request Call back at Work Phone [phone] Message from:  Patient  Refills Requested: Medication #1:  ULTRAM 50 MG TABS 1-2  by mouth every 6 hours as needed pt left VM [facility] uses [facility].pt did not indicate which one.............Marland KitchenFelecia Deloach CMA  April 11, 2010 2:32 PM    Follow-up for Phone Call        last done 01-11-10. Please advise. Lucious Groves CMA  April 12, 2010 9:06 AM   Additional Follow-up for Phone Call Additional follow up Details #1::        refill x1 Additional Follow-up by: Loreen Freud DO,  April 12, 2010 11:01 AM    Prescriptions: ULTRAM 50 MG TABS (TRAMADOL HCL) 1-2  by mouth every 6 hours as needed  #90 Each x 0   Entered by:   Doristine Devoid CMA   Authorized by:   Loreen Freud DO   Signed by:   Doristine Devoid CMA on 04/12/2010   Method used:   Electronically to        [facility]  [address] [facility] (retail)       [address]       [place], [place]  [address]       Ph: [phone]       Fax: [phone]   RxID:   [id number]

## 2010-08-09 NOTE — Progress Notes (Signed)
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 He says he normally uses the inhalers on a as needed basis and is out now because he had to use it a bunch this past week.  Initial call taken by: Harold Barban,  February 08, 2010 11:52 AM  Follow-up for Phone Call        have we ever filled for him before---not on med list.  As far as I know generic inhaler no longer available----I need to know what he used. Follow-up by: Loreen Freud DO,  February 08, 2010 12:33 PM  Additional Follow-up for Phone Call Additional follow up Details #1::        Patient noted that the office has not filled it previously and he will call back tomorrow to let me know the name of it. (he must ask his wife).  Lucious Groves CMA,  February 08, 2010 4:53 PM    Additional Follow-up for Phone Call Additional follow up Details #2::    Patient left message on triage that the inhaler he used to get was ProAir (he states that it is the same as Albuterol). Patient is taking Loratadine (otc) and would like a script for that also. Please advise. Lucious Groves CMA  February 09, 2010 11:11 AM   Additional Follow-up for Phone Call Additional follow up Details #3:: Details for Additional Follow-up Action Taken: rx sent to pharmacy in [place]  [facility] [place], left pt detail message rx sent...............Marland KitchenFelecia Deloach CMA  February 09, 2010 12:03 PM  Additional Follow-up by: Loreen Freud DO,  February 09, 2010 11:58 AM  New/Updated Medications: PROAIR HFA 108 (90 BASE) MCG/ACT AERS (ALBUTEROL SULFATE) 2 puffs qid as needed CLARITIN 10 MG TABS (LORATADINE) 1 by mouth once daily as needed Prescriptions: CLARITIN 10 MG TABS (LORATADINE) 1 by mouth once daily as needed  #30 x 11  Entered by:   Jeremy Johann CMA   Authorized by:   Loreen Freud DO   Signed by:   Jeremy Johann CMA on 02/09/2010   Method used:   Re-Faxed to ...       [facility] [address] [facility] ([facility]ail)       [address]       [place], [place]  [address]       Ph: [phone]       Fax: [phone]   RxID:   [id number] PROAIR HFA 108 (90 BASE) MCG/ACT AERS (ALBUTEROL SULFATE) 2 puffs qid as needed  #1 x 1   Entered by:   Jeremy Johann CMA   Authorized by:   Loreen Freud DO   Signed by:   Jeremy Johann CMA on 02/09/2010   Method used:   Re-Faxed to ...       [facility] [address] [facility] ([facility]ail)       [address]       [place], [place]  [address]       Ph: [phone]       Fax: [phone]   RxID:   [id number] CLARITIN 10 MG TABS (LORATADINE)  1 by mouth once daily as needed  #30 x 11   Entered and Authorized by:   Loreen Freud DO   Signed by:   Loreen Freud DO on 02/09/2010   Method used:   Electronically to        [facility] (retail)       [address]       [place], [place]  [address]       Ph: [phone] or [phone]       Fax: [phone]   RxID:   [id number] PROAIR HFA 108 (90 BASE) MCG/ACT AERS (ALBUTEROL SULFATE) 2 puffs qid as needed  #1 x 1   Entered and Authorized by:   Loreen Freud DO   Signed by:   Loreen Freud DO on 02/09/2010   Method used:   Electronically to        [facility]* (retail)       [address]       [place], [place]  [address]       Ph: [phone] or [phone]       Fax: [phone]   RxID:   [id number]

## 2010-08-09 NOTE — Assessment & Plan Note (Signed)
Summary: congested/cbs   Vital Signs:  Patient profile:   55 year old male Weight:      248.38 pounds Temp:     98.3 degrees F oral Pulse rate:   81 / minute Pulse rhythm:   regular BP sitting:   128 / 88  (left arm) Cuff size:   large  Vitals Entered By: Army Fossa CMA (April 18, 2010 3:23 PM) CC: Pt here c/o head congestion, chest congestion.  Comments x 8 days [facility].    History of Present Illness: here with his wife 8 days history of sinus and frontal headache congestion , left ear aching, flulike symptoms (aching ) He has been taking doxycycline for 3 to 4  weeks due to a  MRSA infection, now feels his tongue is raw, red and has few white places. MRSA infection is  much better  ROS no fever No nausea or vomiting Some cough with occasional green sputum Some sore throat  Current Medications (verified): 1)  Metoprolol Tartrate 25 Mg Tabs (Metoprolol Tartrate) .... 1/2 Tablet By Mouth Twice Daily 2)  Lisinopril-Hydrochlorothiazide 20-25 Mg Tabs (Lisinopril-Hydrochlorothiazide) .Marland Kitchen.. 1 By Mouth Once Daily 3)  Omeprazole 40 Mg Cpdr (Omeprazole) .... Take 1 Tab  Once Daily 4)  Aspirin Ec 81 Mg Tbec (Aspirin) .... Take 1 Tab Once Daily 5)  Pravachol 40 Mg Tabs (Pravastatin Sodium) .Marland Kitchen.. 1 By Mouth At Bedtime 6)  Metformin Hcl 500 Mg Tabs (Metformin Hcl) .... Take 1 Tab Two Times A Day 7)  Flonase 50 Mcg/act Susp (Fluticasone Propionate) .... 2 Sprays Each Nostril Once Daily 8)  Adderall 20 Mg Tabs (Amphetamine-Dextroamphetamine) .Marland Kitchen.. 1 By Mouth Once Daily 9)  Claritin 10 Mg Tabs (Loratadine) .... Once Daily 10)  Ultram 50 Mg Tabs (Tramadol Hcl) .Marland Kitchen.. 1-2  By Mouth Every 6 Hours As Needed 11)  Vitamin D [address] Unit Caps (Ergocalciferol) .... Take One Capsule Weekly. 12)  Mobic 15 Mg Tabs (Meloxicam) .... 1/2 -1 By Mouth Once Daily As Needed 13)  Krill Oil 1000 Mg Caps (Krill Oil) .... 2 By Mouth Two Times A Day 14)  Cpap 15)  Onetouch Delica Lancets  Misc  (Lancets) .... Accu Check Two Times A Day 16)  One Touch Ultra Mini Strips .... Bld Sugar Checks Two Times A Day 17)  Gabapentin 300 Mg Caps (Gabapentin) .Marland Kitchen.. 1 By Mouth Three Times A Day As Needed 18)  L-Arginine 1000 Mg Tabs (Arginine) .Marland Kitchen.. 1 By Mouth Two Times A Day 19)  Reveratrol .Marland Kitchen.. 1 By Mouth Two Times A Day 20)  Vitamin D3 1000 Unit Caps (Cholecalciferol) .Marland Kitchen.. 1 By Mouth Once Daily 21)  Celexa 40 Mg Tabs (Citalopram Hydrobromide) .Marland Kitchen.. 1 By Mouth At Bedtime. 22)  Cialis 20 Mg Tabs (Tadalafil) .... As Directed. 23)  Proair Hfa 108 (90 Base) Mcg/act Aers (Albuterol Sulfate) .... 2 Puffs Qid As Needed 24)  Claritin 10 Mg Tabs (Loratadine) .Marland Kitchen.. 1 By Mouth Once Daily As Needed  Allergies (verified): 1)  ! Sulfa 2)  ! Vicodin  Past History:  Past Medical History: Reviewed history from 07/13/2008 and no changes required. Diabetes mellitus, type II Hypertension heart disease  cancer-kidney 1999 sleep apinea cpap Hyperlipidemia  Past Surgical History: Reviewed history from 11/15/2009 and no changes required. OSA surgery--08/1988 Tonsillectomy 08/1988 Cholecystectomy  2000 Nephrectomy -L 1999-- cancer Sinus surgery 2005 L elbowsurgery myrigotomy tube r earg  Social History: Reviewed history from 11/23/2008 and no changes required. Occupation: truck Hospital doctor Married Former Smoker Alcohol use-yes Drug use-no  Regular exercise-yes  Physical Exam  General:  alert and well-developed.   Head:  face symmetric, nontender to palpation Ears:  L ear normal.  right ear there is a ear tube in the canal Nose:  no congestion Mouth:  status post a uvuloplasty, the tongue is slightly red, few scattered white patches. Throat is slightly red, no discharge Lungs:  normal respiratory effort, no intercostal retractions, no accessory muscle use, and normal breath sounds.     Impression & Recommendations:  Problem # 1:  URI (ICD-465.9) symptoms consistent with URI, see instructions   His  updated medication list for this problem includes:    Aspirin Ec 81 Mg Tbec (Aspirin) .Marland Kitchen... Take 1 tab once daily    Claritin 10 Mg Tabs (Loratadine) ..... Once daily    Mobic 15 Mg Tabs (Meloxicam) .Marland Kitchen... 1/2 -1 by mouth once daily as needed    Claritin 10 Mg Tabs (Loratadine) .Marland Kitchen... 1 by mouth once daily as needed  Problem # 2:  status post antibiotics for several weeks for MRSA infection now his tongue is slightly red and has few white patches. We'll prescribe Diflucan  Complete Medication List: 1)  Metoprolol Tartrate 25 Mg Tabs (Metoprolol tartrate) .... 1/2 tablet by mouth twice daily 2)  Lisinopril-hydrochlorothiazide 20-25 Mg Tabs (Lisinopril-hydrochlorothiazide) .Marland Kitchen.. 1 by mouth once daily 3)  Omeprazole 40 Mg Cpdr (Omeprazole) .... Take 1 tab  once daily 4)  Aspirin Ec 81 Mg Tbec (Aspirin) .... Take 1 tab once daily 5)  Pravachol 40 Mg Tabs (Pravastatin sodium) .Marland Kitchen.. 1 by mouth at bedtime 6)  Metformin Hcl 500 Mg Tabs (Metformin hcl) .... Take 1 tab two times a day 7)  Flonase 50 Mcg/act Susp (Fluticasone propionate) .... 2 sprays each nostril once daily 8)  Adderall 20 Mg Tabs (Amphetamine-dextroamphetamine) .Marland Kitchen.. 1 by mouth once daily 9)  Claritin 10 Mg Tabs (Loratadine) .... Once daily 10)  Ultram 50 Mg Tabs (Tramadol hcl) .Marland Kitchen.. 1-2  by mouth every 6 hours as needed 11)  Vitamin D 60454 Unit Caps (Ergocalciferol) .... Take one capsule weekly. 12)  Mobic 15 Mg Tabs (Meloxicam) .... 1/2 -1 by mouth once daily as needed 13)  Krill Oil 1000 Mg Caps (Krill oil) .... 2 by mouth two times a day 14)  Cpap  15)  Onetouch Delica Lancets Misc (Lancets) .... Accu check two times a day 16)  One Touch Ultra Mini Strips  .... Bld sugar checks two times a day 17)  Gabapentin 300 Mg Caps (Gabapentin) .Marland Kitchen.. 1 by mouth three times a day as needed 18)  L-arginine 1000 Mg Tabs (Arginine) .Marland Kitchen.. 1 by mouth two times a day 19)  Reveratrol  .Marland Kitchen.. 1 by mouth two times a day 20)  Vitamin D3 1000 Unit Caps  (Cholecalciferol) .Marland Kitchen.. 1 by mouth once daily 21)  Celexa 40 Mg Tabs (Citalopram hydrobromide) .Marland Kitchen.. 1 by mouth at bedtime. 22)  Cialis 20 Mg Tabs (Tadalafil) .... As directed. 23)  Proair Hfa 108 (90 Base) Mcg/act Aers (Albuterol sulfate) .... 2 puffs qid as needed 24)  Claritin 10 Mg Tabs (Loratadine) .Marland Kitchen.. 1 by mouth once daily as needed 25)  Diflucan 150 Mg Tabs (Fluconazole) .... One by mouth daily x2  Patient Instructions: 1)  continue with Flonase and Claritin 2)  Mucinex DM twice a day until better 3)  Diflucan for 2 days 4)  Call if not better in 5-6 days Prescriptions: DIFLUCAN 150 MG TABS (FLUCONAZOLE) one by mouth daily x2  #2 x 0  Entered and Authorized by:   Nolon Rod. Paz MD   Signed by:   Nolon Rod. Paz MD on 04/18/2010   Method used:   Print then Give to Patient   RxID:   [id number]

## 2010-08-09 NOTE — Progress Notes (Signed)
Summary: refill  Phone Note Refill Request Message from:  Patient on March 29, 2010 3:25 PM  Refills Requested: Medication #1:  ADDERALL 20 MG TABS 1 by mouth once daily patient didnt get rx at [facility] [id number]  Initial call taken by: Okey Regal [place],  March 29, 2010 3:26 PM    Prescriptions: ADDERALL 20 MG TABS (AMPHETAMINE-DEXTROAMPHETAMINE) 1 by mouth once daily  #30 x 0   Entered by:   Almeta Monas CMA (AAMA)   Authorized by:   Loreen Freud DO   Signed by:   Almeta Monas CMA (AAMA) on 03/30/2010   Method used:   Print then Give to Patient   RxID:   [id number]

## 2010-08-09 NOTE — Progress Notes (Signed)
Summary: NEEDS ADDERRAL RX BY 5PM TODAY  Phone Note Call from Patient Call back at Home Phone [phone] Call back at [facility] CELL - [phone]   Caller: Spouse Summary of Call: NEEDS TO PICK UP ADDERRAL PRESCRIPTION BEFORE 5:00 PM ON THURSDAY  WIFE BARBARA  WILL BE IN TOWN FROM [place] AT THAT TIME    (SHE  WILL PICK UP  PRESCRIPTION FOR BARBARA TOO)--SEE HER PHONE NOTE Initial call taken by: Jerolyn Shin,  January 20, 2010 1:16 PM  Follow-up for Phone Call        PT WIFE AWARE RX READY FOR PICK-UP...........Marland KitchenFelecia Deloach CMA  January 20, 2010 2:29 PM     Prescriptions: ADDERALL 20 MG TABS (AMPHETAMINE-DEXTROAMPHETAMINE) 1 by mouth once daily  #30 x 0   Entered by:   Jeremy Johann CMA   Authorized by:   Loreen Freud DO   Signed by:   Jeremy Johann CMA on 01/20/2010   Method used:   Print then Give to Patient   RxID:   [id number]

## 2010-08-09 NOTE — Letter (Signed)
Summary: [facility]: Immunoassay Fecal Occult Blood (iFOB) Order Form  [facility] [place]  [address] [place], [place] [address]   Phone: [phone]  Fax: [phone]      [facility]: Immunoassay Fecal Occult Blood (iFOB) Order Form   Nov 15, 2009 MRN: [id number]   Jesse Vargas [birth year]   Physicican Name:_____[name], Do____________________  Diagnosis Code:________v76.51__________________      Army Fossa CMA

## 2010-08-09 NOTE — Progress Notes (Signed)
Summary: Rx  Phone Note Call from Patient Call back at Work Phone [phone]   Caller: Patient Summary of Call: Patient called and states that he would like an rx for Cialis 20 mg. Pharm- [facility], [place]. Marland KitchenPlease advise. Army Fossa CMA  January 27, 2010 3:04 PM   Follow-up for Phone Call        cialis 20 mg #3  as directed  4 refills Follow-up by: Loreen Freud DO,  January 27, 2010 4:03 PM  Additional Follow-up for Phone Call Additional follow up Details #1::        I spoke with pt he is aware. Army Fossa CMA  January 27, 2010 4:10 PM     New/Updated Medications: CIALIS 20 MG TABS (TADALAFIL) as directed. Prescriptions: CIALIS 20 MG TABS (TADALAFIL) as directed.  #3 x 4   Entered by:   Army Fossa CMA   Authorized by:   Loreen Freud DO   Signed by:   Army Fossa CMA on 01/27/2010   Method used:   Electronically to        [facility]  [address] (retail)       [address]  [address]       [place], [place]  [address]       Ph: [phone]       Fax: [phone]   RxID:   [id number]

## 2010-08-09 NOTE — Progress Notes (Signed)
Summary: Lab Results  Phone Note Outgoing Call   Call placed by: Army Fossa CMA,  Nov 17, 2009 1:14 PM Reason for Call: Discuss lab or test results Summary of Call: Lab Results, LMTCB:  cholesterol still high---add welchol 1 packet daily ----recheck labs 3 months ---272.4 250.00  hgba1c, bmp, hep,lipid Signed by Loreen Freud DO on 11/16/2009 at 5:06 PM   Follow-up for Phone Call        Pt is aware, meds called in. Army Fossa CMA  Nov 17, 2009 2:54 PM     New/Updated Medications: WELCHOL 3.75 GM PACK (COLESEVELAM HCL) Mix 1 packet in water daily. Army Fossa CMA  Nov 17, 2009 2:55 PM Prescriptions: WELCHOL 3.75 GM PACK (COLESEVELAM HCL) Mix 1 packet in water daily. Army Fossa CMA  Nov 17, 2009 2:55 PM  #30 x 2   Entered by:   Army Fossa CMA   Authorized by:   Loreen Freud DO   Signed by:   Army Fossa CMA on 11/17/2009   Method used:   Electronically to        [facility]  [address] (retail)       [address]       [place], [place]  [address]       Ph: [phone]       Fax: [phone]   RxID:   [id number]

## 2010-08-09 NOTE — Assessment & Plan Note (Signed)
Summary: F/U WAS DX W/ MRSA/CDJ   Vital Signs:  Patient profile:   55 year old male Weight:      255.6 pounds Pulse rate:   64 / minute Pulse rhythm:   regular BP sitting:   136 / 90  (right arm)  Vitals Entered By: Almeta Monas CMA Duncan Dull) (March 28, 2010 4:16 PM) CC: f/u MRSA also c/o HA and Odor to the urine   History of Present Illness: Pt here to f/u MRSA --  see ER records.  Abscess was on his neck.  It is much better.  Current Medications (verified): 1)  Metoprolol Tartrate 25 Mg Tabs (Metoprolol Tartrate) .... 1/2 Tablet By Mouth Twice Daily 2)  Lisinopril-Hydrochlorothiazide 20-25 Mg Tabs (Lisinopril-Hydrochlorothiazide) .Marland Kitchen.. 1 By Mouth Once Daily 3)  Omeprazole 40 Mg Cpdr (Omeprazole) .... Take 1 Tab  Once Daily 4)  Aspirin Ec 81 Mg Tbec (Aspirin) .... Take 1 Tab Once Daily 5)  Pravachol 40 Mg Tabs (Pravastatin Sodium) .Marland Kitchen.. 1 By Mouth At Bedtime 6)  Metformin Hcl 500 Mg Tabs (Metformin Hcl) .... Take 1 Tab Two Times A Day 7)  Flonase 50 Mcg/act Susp (Fluticasone Propionate) .... 2 Sprays Each Nostril Once Daily 8)  Adderall 20 Mg Tabs (Amphetamine-Dextroamphetamine) .Marland Kitchen.. 1 By Mouth Once Daily 9)  Vitamin D [address] Unit Caps (Ergocalciferol) .... Take 1 Tab Weekly 10)  Claritin 10 Mg Tabs (Loratadine) .... Once Daily 11)  Ultram 50 Mg Tabs (Tramadol Hcl) .Marland Kitchen.. 1-2  By Mouth Every 6 Hours As Needed 12)  Vitamin D 60454 Unit Caps (Ergocalciferol) .... Take One Capsule Weekly. 13)  Mobic 15 Mg Tabs (Meloxicam) .... 1/2 -1 By Mouth Once Daily As Needed 14)  Krill Oil 1000 Mg Caps (Krill Oil) .... 2 By Mouth Two Times A Day 15)  Antara 130 Mg Caps (Fenofibrate Micronized) .Marland Kitchen.. 1 By Mouth By Mouth Once Daily. 16)  Cpap 17)  Flexeril 10 Mg Tabs (Cyclobenzaprine Hcl) .Marland Kitchen.. 1 By Mouth At Bedtime As Needed 18)  Onetouch Delica Lancets  Misc (Lancets) .... Accu Check Two Times A Day 19)  One Touch Ultra Mini Strips .... Bld Sugar Checks Two Times A Day 20)  Gabapentin 300 Mg Caps  (Gabapentin) .Marland Kitchen.. 1 By Mouth Three Times A Day As Needed 21)  L-Arginine 1000 Mg Tabs (Arginine) .Marland Kitchen.. 1 By Mouth Two Times A Day 22)  Reveratrol .Marland Kitchen.. 1 By Mouth Two Times A Day 23)  Vitamin D3 1000 Unit Caps (Cholecalciferol) .Marland Kitchen.. 1 By Mouth Once Daily 24)  Welchol 3.75 Gm Pack (Colesevelam Hcl) .... Mix 1 Packet in Water Daily. Army Fossa Cma  Nov 17, 2009 2:55 Pm 25)  Celexa 40 Mg Tabs (Citalopram Hydrobromide) .Marland Kitchen.. 1 By Mouth At Bedtime. 26)  Cialis 20 Mg Tabs (Tadalafil) .... As Directed. 27)  Proair Hfa 108 (90 Base) Mcg/act Aers (Albuterol Sulfate) .... 2 Puffs Qid As Needed 28)  Claritin 10 Mg Tabs (Loratadine) .Marland Kitchen.. 1 By Mouth Once Daily As Needed  Allergies (verified): 1)  ! Sulfa 2)  ! Vicodin  Past History:  Past Medical History: Last updated: 07/13/2008 Diabetes mellitus, type II Hypertension heart disease  cancer-kidney 1999 sleep apinea cpap Hyperlipidemia  Past Surgical History: Last updated: 11/15/2009 OSA surgery--08/1988 Tonsillectomy 08/1988 Cholecystectomy  2000 Nephrectomy -L 1999-- cancer Sinus surgery 2005 L elbowsurgery myrigotomy tube r earg  Family History: Last updated: 07/13/2008 dm-father HTN-mother,father Family History High cholesterol-father  Social History: Last updated: 11/23/2008 Occupation: truck driver Married Former Smoker Alcohol use-yes Drug use-no  Regular exercise-yes  Risk Factors: Alcohol Use: <1 (11/15/2009) Caffeine Use: 3 (11/15/2009) Exercise: no (11/15/2009)  Risk Factors: Smoking Status: quit (11/15/2009) Passive Smoke Exposure: no (11/15/2009)  Family History: Reviewed history from 07/13/2008 and no changes required. dm-father HTN-mother,father Family History High cholesterol-father  Social History: Reviewed history from 11/23/2008 and no changes required. Occupation: truck Hospital doctor Married Former Smoker Alcohol use-yes Drug use-no Regular exercise-yes  Review of Systems      See  HPI  Physical Exam  General:  Well-developed,well-nourished,in no acute distress; alert,appropriate and cooperative throughout examination Skin:  abscess on back of neck no drainage healing well Cervical Nodes:  No lymphadenopathy noted Psych:  Oriented X3 and normally interactive.     Impression & Recommendations:  Problem # 1:  CELLULITIS, METHICILLIN RESISTANT STAPHYLOCCOCUS AREUS (ICD-682.9) Assessment New Finish abx Elevate affected area. Warm moist compresses for 20 minutes every 2 hours while awake. Take antibiotics as directed and take acetaminophen as needed. To be seen in 48-72 hours if no improvement, sooner if worse.  Complete Medication List: 1)  Metoprolol Tartrate 25 Mg Tabs (Metoprolol tartrate) .... 1/2 tablet by mouth twice daily 2)  Lisinopril-hydrochlorothiazide 20-25 Mg Tabs (Lisinopril-hydrochlorothiazide) .Marland Kitchen.. 1 by mouth once daily 3)  Omeprazole 40 Mg Cpdr (Omeprazole) .... Take 1 tab  once daily 4)  Aspirin Ec 81 Mg Tbec (Aspirin) .... Take 1 tab once daily 5)  Pravachol 40 Mg Tabs (Pravastatin sodium) .Marland Kitchen.. 1 by mouth at bedtime 6)  Metformin Hcl 500 Mg Tabs (Metformin hcl) .... Take 1 tab two times a day 7)  Flonase 50 Mcg/act Susp (Fluticasone propionate) .... 2 sprays each nostril once daily 8)  Adderall 20 Mg Tabs (Amphetamine-dextroamphetamine) .Marland Kitchen.. 1 by mouth once daily 9)  Vitamin D 40981 Unit Caps (Ergocalciferol) .... Take 1 tab weekly 10)  Claritin 10 Mg Tabs (Loratadine) .... Once daily 11)  Ultram 50 Mg Tabs (Tramadol hcl) .Marland Kitchen.. 1-2  by mouth every 6 hours as needed 12)  Vitamin D 19147 Unit Caps (Ergocalciferol) .... Take one capsule weekly. 13)  Mobic 15 Mg Tabs (Meloxicam) .... 1/2 -1 by mouth once daily as needed 14)  Krill Oil 1000 Mg Caps (Krill oil) .... 2 by mouth two times a day 15)  Antara 130 Mg Caps (Fenofibrate micronized) .Marland Kitchen.. 1 by mouth by mouth once daily. 16)  Cpap  17)  Flexeril 10 Mg Tabs (Cyclobenzaprine hcl) .Marland Kitchen.. 1 by mouth  at bedtime as needed 18)  Onetouch Delica Lancets Misc (Lancets) .... Accu check two times a day 19)  One Touch Ultra Mini Strips  .... Bld sugar checks two times a day 20)  Gabapentin 300 Mg Caps (Gabapentin) .Marland Kitchen.. 1 by mouth three times a day as needed 21)  L-arginine 1000 Mg Tabs (Arginine) .Marland Kitchen.. 1 by mouth two times a day 22)  Reveratrol  .Marland Kitchen.. 1 by mouth two times a day 23)  Vitamin D3 1000 Unit Caps (Cholecalciferol) .Marland Kitchen.. 1 by mouth once daily 24)  Welchol 3.75 Gm Pack (Colesevelam hcl) .... Mix 1 packet in water daily. danielle barmer cma  Nov 17, 2009 2:55 pm 25)  Celexa 40 Mg Tabs (Citalopram hydrobromide) .Marland Kitchen.. 1 by mouth at bedtime. 26)  Cialis 20 Mg Tabs (Tadalafil) .... As directed. 27)  Proair Hfa 108 (90 Base) Mcg/act Aers (Albuterol sulfate) .... 2 puffs qid as needed 28)  Claritin 10 Mg Tabs (Loratadine) .Marland Kitchen.. 1 by mouth once daily as needed  Laboratory Results   Urine Tests    Routine Urinalysis  Color: yellow Appearance: Clear Glucose: negative   (Normal Range: Negative) Bilirubin: negative   (Normal Range: Negative) Ketone: negative   (Normal Range: Negative) Spec. Gravity: 1.010   (Normal Range: 1.003-1.035) Blood: negative   (Normal Range: Negative) pH: 5.0   (Normal Range: 5.0-8.0) Protein: trace   (Normal Range: Negative) Urobilinogen: 0.2   (Normal Range: 0-1) Nitrite: negative   (Normal Range: Negative) Leukocyte Esterace: negative   (Normal Range: Negative)

## 2010-08-09 NOTE — Progress Notes (Signed)
Summary: celexa  Phone Note Call from Patient   Caller: Patient Call For: Loreen Freud DO Summary of Call: Pt called and stated that he has been off his Celexa for 7 days and is feeling fine without it. He would like to know if it is fine for him to stay off the Celexa. Please advise. Army Fossa CMA  September 30, 2009 12:05 PM   Follow-up for Phone Call        that is fine Follow-up by: Loreen Freud DO,  September 30, 2009 12:12 PM  Additional Follow-up for Phone Call Additional follow up Details #1::        Pt is aware. Army Fossa CMA  September 30, 2009 1:12 PM

## 2010-08-09 NOTE — Progress Notes (Signed)
Summary: Omeprazole refill  Phone Note Refill Request Message from:  Fax from Pharmacy on May 26, 2010 1:16 PM  Refills Requested: Medication #1:  OMEPRAZOLE 40 MG CPDR take 1 tab  once daily   Last Refilled: 02/20/2010 [facility], [address], [place], [place]  phone=not given on fax, [phone]    qty = 30  Next Appointment Scheduled: none Initial call taken by: Jerolyn Shin,  May 26, 2010 1:18 PM    Prescriptions: OMEPRAZOLE 40 MG CPDR (OMEPRAZOLE) take 1 tab  once daily  #30 x 6   Entered by:   Doristine Devoid CMA   Authorized by:   Loreen Freud DO   Signed by:   Doristine Devoid CMA on 05/26/2010   Method used:   Electronically to        [facility]  [facility] [address] (retail)       [address]       [place], [place]  [address]       Ph: [phone]       Fax: [phone]   RxID:   [id number]

## 2010-08-09 NOTE — Letter (Signed)
Summary: Primary Care Consult Scheduled Letter  [facility] [place]  [address] [place], [place] [address]   Phone: [phone]  Fax: [phone]      08/18/2009 MRN: [id number]  BIRAN MAYBERRY [address] [place], [place]  [address]    Dear Mr. BRANDENBURG,    We have scheduled an appointment for you.  At the recommendation of Dr. Loreen Freud, we have scheduled you a consult with Dr. Cyril Mourning with [facility] on 08-30-2009 at 3:15pm.  Their address is [address], [place] [address]. The office phone number is [phone].  If this appointment day and time is not convenient for you, please feel free to call the office of the doctor you are being referred to at the number listed above and reschedule the appointment.    It is important for you to keep your scheduled appointments. We are here to make sure you are given good patient care.   Thank you,    Renee, Patient Care Coordinator [facility] [place]    **IF YOU ARE UNABLE TO KEEP THIS APPOINTMENT, OR NEED TO RESCHEDULE, PLEASE GIVE DR. Reginia Naas OFFICE A 24 HOUR NOTICE TO AVOID A $50 FEE**

## 2010-08-09 NOTE — Progress Notes (Signed)
  Phone Note Refill Request   Refills Requested: Medication #1:  ADDERALL 20 MG TABS 1 by mouth once daily Will pick up on monday. Army Fossa CMA  September 27, 2009 8:06 AM      Prescriptions: ADDERALL 20 MG TABS (AMPHETAMINE-DEXTROAMPHETAMINE) 1 by mouth once daily  #30 x 0   Entered by:   Army Fossa CMA   Authorized by:   Loreen Freud DO   Signed by:   Army Fossa CMA on 09/27/2009   Method used:   Print then Give to Patient   RxID:   [id number]

## 2010-08-09 NOTE — Progress Notes (Signed)
Summary: refill   Phone Note Refill Request   Refills Requested: Medication #1:  ADDERALL 20 MG TABS 1 by mouth once daily   Last Refilled: 03/23/2009  Medication #2:  ULTRAM 50 MG TABS 1-2  by mouth every 6 hours as needed   Last Refilled: 06/11/2009 [facility] [place]- last ov 05/24/09  Initial call taken by: Army Fossa CMA,  July 13, 2009 10:50 AM  Follow-up for Phone Call        Pt stated that the pain is not as bad, but still there. Per Dr.Lowne he is taking to much pain medicine she will refill 60 pills. He states he cannot see an Orthopedic doctor. I told him we would recommend him seeing an ortho. He again stated he could not see an ortho.  Follow-up by: Army Fossa CMA,  July 13, 2009 12:07 PM    Prescriptions: ULTRAM 50 MG TABS (TRAMADOL HCL) 1-2  by mouth every 6 hours as needed  #60 x 0   Entered by:   Army Fossa CMA   Authorized by:   Loreen Freud DO   Signed by:   Army Fossa CMA on 07/13/2009   Method used:   Electronically to        [facility] (retail)       [address]       [place], [place]  [address]       Ph: [phone] or [phone]       Fax: [phone]   RxID:   [id number]

## 2010-08-09 NOTE — Progress Notes (Signed)
Summary: refill  Phone Note Refill Request Call back at Work Phone [phone] Call back at [phone] Message from:  Patient  Refills Requested: Medication #1:  ADDERALL 20 MG TABS 1 by mouth once daily call when ready - patient wife would like to p/u today   Method Requested: Pick up at Office Initial call taken by: Okey Regal [place],  Nov 10, 2009 2:34 PM  Follow-up for Phone Call        left pt detail message rx ready for pick-up tomorrow after 10 am...................Marland KitchenFelecia Deloach CMA  Nov 10, 2009 4:47 PM     Prescriptions: ADDERALL 20 MG TABS (AMPHETAMINE-DEXTROAMPHETAMINE) 1 by mouth once daily  #30 x 0   Entered by:   Jeremy Johann CMA   Authorized by:   Loreen Freud DO   Signed by:   Jeremy Johann CMA on 11/10/2009   Method used:   Print then Give to Patient   RxID:   [id number]

## 2010-08-09 NOTE — Progress Notes (Signed)
  Phone Note Outgoing Call   Summary of Call: Spoke with pt he would like the Tramadol to go to [facility] in [place]. I called the [facility] and cancelled the rx that was sent there- spoke with andy.  Initial call taken by: Army Fossa CMA,  July 13, 2009 1:23 PM    Prescriptions: ULTRAM 50 MG TABS (TRAMADOL HCL) 1-2  by mouth every 6 hours as needed  #60 x 0   Entered by:   Army Fossa CMA   Authorized by:   Loreen Freud DO   Signed by:   Army Fossa CMA on 07/13/2009   Method used:   Electronically to        [facility]  [address] (retail)       [address]       [place], [place]  [address]       Ph: [phone]       Fax: [phone]   RxID:   [id number]

## 2010-08-09 NOTE — Progress Notes (Signed)
Summary: ct results   Phone Note Outgoing Call   Call placed by: Doristine Devoid,  September 30, 2009 10:11 AM Call placed to: Patient Summary of Call: + fatty liver----diet and exercise will help this no evidence of cancer   Follow-up for Phone Call        spoke w/ patient wife aware of CT results.......Marland KitchenDoristine Devoid  September 30, 2009 10:11 AM

## 2010-08-09 NOTE — Progress Notes (Signed)
Summary: refill  Phone Note Refill Request Message from:  Patient on September 29, 2009 2:25 PM  Refills Requested: Medication #1:  ADDERALL 20 MG TABS 1 by mouth once daily  Method Requested: Pick up at Office Next Appointment Scheduled: 11/15/2009 Initial call taken by: Barb Merino,  September 29, 2009 2:25 PM  Follow-up for Phone Call        Pt has rx upfront. Army Fossa CMA  September 29, 2009 2:34 PM

## 2010-08-09 NOTE — Assessment & Plan Note (Signed)
Summary: cpx//pt will be fasting//lch   Vital Signs:  Patient profile:   55 year old male Height:      70 inches Weight:      238 pounds Pulse rate:   86 / minute Pulse rhythm:   regular BP sitting:   128 / 86  (left arm) Cuff size:   large  Vitals Entered By: Army Fossa CMA (Nov 15, 2009 9:08 AM) CC: Pt here for CPX, needs refill on Gabapentin. Pt is fasting.    History of Present Illness: Pt here for cpe and labs.    Hyperlipidemia follow-up      This is a 55 year old man who presents for Hyperlipidemia follow-up.  The patient denies muscle aches, GI upset, abdominal pain, flushing, itching, constipation, diarrhea, and fatigue.  The patient denies the following symptoms: chest pain/pressure, exercise intolerance, dypsnea, palpitations, syncope, and pedal edema.  Compliance with medications (by patient report) has been near 100%.  Dietary compliance has been fair.  The patient reports exercising occasionally.  Adjunctive measures currently used by the patient include ASA, fish oil supplements, and limiting alcohol consumpton.    Hypertension follow-up      The patient also presents for Hypertension follow-up.  The patient denies lightheadedness, urinary frequency, headaches, edema, impotence, rash, and fatigue.  The patient denies the following associated symptoms: chest pain, chest pressure, exercise intolerance, dyspnea, palpitations, syncope, leg edema, and pedal edema.  Compliance with medications (by patient report) has been near 100%.  The patient reports that dietary compliance has been fair.  The patient reports exercising occasionally.  Adjunctive measures currently used by the patient include salt restriction.    Pt also c/o sore throat and swollen glands--- no othe complaints.    Type 1 diabetes mellitus follow-up      The patient is also here for Type 2 diabetes mellitus follow-up.  The patient denies polyuria, polydipsia, blurred vision, self managed hypoglycemia,  hypoglycemia requiring help, weight loss, weight gain, and numbness of extremities.  Other symptoms include neuropathic pain.  The patient denies the following symptoms: chest pain, vomiting, orthostatic symptoms, poor wound healing, intermittent claudication, vision loss, and foot ulcer.  Since the last visit the patient reports poor dietary compliance, compliance with medications, not exercising regularly, and monitoring blood glucose.  The patient has been measuring capillary blood glucose before breakfast.  Since the last visit, the patient reports having had eye care by an ophthalmologist and no foot care.  Complications from diabetes include peripheral neuropathy and sexual dysfunction.  Pt states when glucose goes down to 110 he gets sick.    Preventive Screening-Counseling & Management  Alcohol-Tobacco     Alcohol drinks/day: <1     Alcohol type: beer     Smoking Status: quit     Passive Smoke Exposure: no  Caffeine-Diet-Exercise     Caffeine use/day: 3     Does Patient Exercise: no     Exercise Counseling: to improve exercise regimen  Hep-HIV-STD-Contraception     HIV Risk: risk noted     STD Risk: risk noted     Dental Visit-last 6 months no     Dental Care Counseling: goes 1x a year     Sun Exposure-Excessive: no  Safety-Violence-Falls     Seat Belt Use: yes     Smoke Detectors: yes      Sexual History:  multiple partners currently.        Drug Use:  never.    Current Medications (  verified): 1)  Metoprolol Tartrate 25 Mg Tabs (Metoprolol Tartrate) .... 1/2 Tablet By Mouth Twice Daily 2)  Lisinopril-Hydrochlorothiazide 20-25 Mg Tabs (Lisinopril-Hydrochlorothiazide) .Marland Kitchen.. 1 By Mouth Once Daily 3)  Omeprazole 40 Mg Cpdr (Omeprazole) .... Take 1 Tab  Once Daily 4)  Aspirin Ec 81 Mg Tbec (Aspirin) .... Take 1 Tab Once Daily 5)  Pravachol 40 Mg Tabs (Pravastatin Sodium) .Marland Kitchen.. 1 By Mouth At Bedtime 6)  Metformin Hcl 500 Mg Tabs (Metformin Hcl) .... Take 1 Tab Two Times A Day 7)   Flonase 50 Mcg/act Susp (Fluticasone Propionate) .... 2 Sprays Each Nostril Once Daily 8)  Adderall 20 Mg Tabs (Amphetamine-Dextroamphetamine) .Marland Kitchen.. 1 By Mouth Once Daily 9)  Vitamin D 81829 Unit Caps (Ergocalciferol) .... Take 1 Tab Weekly 10)  Claritin 10 Mg Tabs (Loratadine) .... Once Daily 11)  Ultram 50 Mg Tabs (Tramadol Hcl) .Marland Kitchen.. 1-2  By Mouth Every 6 Hours As Needed 12)  Vitamin D 93716 Unit Caps (Ergocalciferol) .... Take One Capsule Weekly. 13)  Mobic 15 Mg Tabs (Meloxicam) .... 1/2 -1 By Mouth Once Daily As Needed 14)  Krill Oil 1000 Mg Caps (Krill Oil) .... 2 By Mouth Two Times A Day 15)  Antara 130 Mg Caps (Fenofibrate Micronized) .Marland Kitchen.. 1 By Mouth By Mouth Once Daily. 16)  Cpap 17)  Flexeril 10 Mg Tabs (Cyclobenzaprine Hcl) .Marland Kitchen.. 1 By Mouth At Bedtime As Needed 18)  Onetouch Delica Lancets  Misc (Lancets) .... Accu Check Two Times A Day 19)  One Touch Ultra Mini Strips .... Bld Sugar Checks Two Times A Day 20)  Gabapentin 300 Mg Caps (Gabapentin) .Marland Kitchen.. 1 By Mouth Three Times A Day As Needed 21)  L-Arginine 1000 Mg Tabs (Arginine) .Marland Kitchen.. 1 By Mouth Two Times A Day 22)  Reveratrol .Marland Kitchen.. 1 By Mouth Two Times A Day 23)  Vitamin D3 1000 Unit Caps (Cholecalciferol) .Marland Kitchen.. 1 By Mouth Once Daily 24)  Augmentin 875-125 Mg Tabs (Amoxicillin-Pot Clavulanate) .Marland Kitchen.. 1 By Mouth Two Times A Day 25)  Mycelex 10 Mg Troc (Clotrimazole) .Marland Kitchen.. 1 By Mouth 5x A Day  Allergies: 1)  ! Sulfa 2)  ! Vicodin  Past History:  Past Medical History: Last updated: 07/13/2008 Diabetes mellitus, type II Hypertension heart disease  cancer-kidney 1999 sleep apinea cpap Hyperlipidemia  Family History: Last updated: 07/13/2008 dm-father HTN-mother,father Family History High cholesterol-father  Social History: Last updated: 11/23/2008 Occupation: truck driver Married Former Smoker Alcohol use-yes Drug use-no Regular exercise-yes  Risk Factors: Alcohol Use: <1 (11/15/2009) Caffeine Use: 3  (11/15/2009) Exercise: no (11/15/2009)  Risk Factors: Smoking Status: quit (11/15/2009) Passive Smoke Exposure: no (11/15/2009)  Past Surgical History: OSA surgery--08/1988 Tonsillectomy 08/1988 Cholecystectomy  2000 Nephrectomy -L 1999-- cancer Sinus surgery 2005 L elbowsurgery myrigotomy tube r earg  ;  Family History: Reviewed history from 07/13/2008 and no changes required. dm-father HTN-mother,father Family History High cholesterol-father  Social History: Reviewed history from 11/23/2008 and no changes required. Occupation: truck Hospital doctor Married Former Smoker Alcohol use-yes Drug use-no Regular exercise-yes Does Patient Exercise:  no HIV Risk:  risk noted STD Risk:  risk noted Sexual History:  multiple partners currently Drug Use:  never  Review of Systems      See HPI General:  Denies chills, fatigue, fever, loss of appetite, malaise, sleep disorder, sweats, weakness, and weight loss. Eyes:  Denies blurring, discharge, double vision, eye irritation, eye pain, halos, itching, light sensitivity, red eye, vision loss-1 eye, and vision loss-both eyes; optho q1y. ENT:  Denies decreased hearing, difficulty swallowing,  ear discharge, earache, hoarseness, nasal congestion, nosebleeds, postnasal drainage, ringing in ears, sinus pressure, and sore throat. CV:  Denies bluish discoloration of lips or nails, chest pain or discomfort, difficulty breathing at night, difficulty breathing while lying down, fainting, fatigue, leg cramps with exertion, lightheadness, near fainting, palpitations, shortness of breath with exertion, swelling of feet, swelling of hands, and weight gain. Resp:  Denies chest discomfort, chest pain with inspiration, cough, coughing up blood, excessive snoring, hypersomnolence, morning headaches, pleuritic, shortness of breath, sputum productive, and wheezing. GI:  Denies abdominal pain, bloody stools, change in bowel habits, constipation, dark tarry stools,  diarrhea, excessive appetite, gas, hemorrhoids, indigestion, loss of appetite, nausea, vomiting, vomiting blood, and yellowish skin color. GU:  Complains of decreased libido; denies discharge, dysuria, erectile dysfunction, genital sores, hematuria, incontinence, nocturia, urinary frequency, and urinary hesitancy. MS:  Complains of joint pain; denies joint redness, joint swelling, loss of strength, low back pain, mid back pain, muscle aches, muscle , cramps, muscle weakness, stiffness, and thoracic pain. Derm:  Denies changes in color of skin, changes in nail beds, dryness, excessive perspiration, flushing, hair loss, insect bite(s), itching, lesion(s), poor wound healing, and rash. Neuro:  Denies brief paralysis, difficulty with concentration, disturbances in coordination, falling down, headaches, inability to speak, memory loss, numbness, poor balance, seizures, sensation of room spinning, tingling, tremors, visual disturbances, and weakness. Psych:  Denies alternate hallucination ( auditory/visual), anxiety, depression, easily angered, easily tearful, irritability, mental problems, panic attacks, sense of great danger, suicidal thoughts/plans, thoughts of violence, unusual visions or sounds, and thoughts /plans of harming others. Endo:  Denies cold intolerance, excessive hunger, excessive thirst, excessive urination, heat intolerance, polyuria, and weight change. Heme:  Denies abnormal bruising, bleeding, enlarge lymph nodes, fevers, pallor, and skin discoloration. Allergy:  Denies hives or rash, itching eyes, persistent infections, seasonal allergies, and sneezing.  Physical Exam  General:  Well-developed,well-nourished,in no acute distress; alert,appropriate and cooperative throughout examination Head:  Normocephalic and atraumatic without obvious abnormalities. No apparent alopecia or balding. Eyes:  pupils equal, pupils round, pupils reactive to light, and no injection.   Ears:  External ear  exam shows no significant lesions or deformities.  Otoscopic examination reveals clear canals, tympanic membranes are intact bilaterally without bulging, retraction, inflammation or discharge. Hearing is grossly normal bilaterally. Nose:  External nasal examination shows no deformity or inflammation. Nasal mucosa are pink and moist without lesions or exudates. Mouth:  pharyngeal erythema and pharyngeal exudate.   Neck:  supple and cervical lymphadenopathy.   Chest Wall:  No deformities, masses, tenderness or gynecomastia noted. Lungs:  Normal respiratory effort, chest expands symmetrically. Lungs are clear to auscultation, no crackles or wheezes. Heart:  normal rate and no murmur.   Abdomen:  Bowel sounds positive,abdomen soft and non-tender without masses, organomegaly or hernias noted. Rectal:  No external abnormalities noted. Normal sphincter tone. No rectal masses or tenderness. Genitalia:  Testes bilaterally descended without nodularity, tenderness or masses. No scrotal masses or lesions. No penis lesions or urethral discharge. Prostate:  Prostate gland firm and smooth, no enlargement, nodularity, tenderness, mass, asymmetry or induration. Msk:  normal ROM, no joint swelling, no joint warmth, no redness over joints, no joint deformities, no joint instability, and no crepitation.   Pulses:  R posterior tibial normal, R dorsalis pedis normal, R carotid normal, L posterior tibial normal, L dorsalis pedis normal, and L carotid normal.   Extremities:  No clubbing, cyanosis, edema, or deformity noted with normal full range of motion of all joints.  Neurologic:  No cranial nerve deficits noted. Station and gait are normal. Plantar reflexes are down-going bilaterally. DTRs are symmetrical throughout. Sensory, motor and coordinative functions appear intact. Skin:  Intact without suspicious lesions or rashes Cervical Nodes:  R anterior LN enlarged and R posterior LN enlarged.   Psych:  Cognition and  judgment appear intact. Alert and cooperative with normal attention span and concentration. No apparent delusions, illusions, hallucinations  Diabetes Management Exam:    Foot Exam (with socks and/or shoes not present):       Sensory-Pinprick/Light touch:          Left medial foot (L-4): normal          Left dorsal foot (L-5): normal          Left lateral foot (S-1): normal          Right medial foot (L-4): normal          Right dorsal foot (L-5): normal          Right lateral foot (S-1): normal       Sensory-Monofilament:          Left foot: normal          Right foot: normal       Inspection:          Left foot: normal          Right foot: normal       Nails:          Left foot: normal          Right foot: normal    Eye Exam:       Eye Exam done elsewhere   Impression & Recommendations:  Problem # 1:  PREVENTIVE HEALTH CARE (ICD-V70.0)  Orders: Venipuncture (19147) TLB-Lipid Panel (80061-LIPID) TLB-BMP (Basic Metabolic Panel-BMET) (80048-METABOL) TLB-CBC Platelet - w/Differential (85025-CBCD) TLB-Hepatic/Liver Function Pnl (80076-HEPATIC) TLB-TSH (Thyroid Stimulating Hormone) (84443-TSH) TLB-A1C / Hgb A1C (Glycohemoglobin) (83036-A1C) TLB-Microalbumin/Creat Ratio, Urine (82043-MALB) TLB-PSA (Prostate Specific Antigen) (84153-PSA)  Reviewed preventive care protocols, scheduled due services, and updated immunizations.  Problem # 2:  ACUTE PHARYNGITIS (ICD-462)  His updated medication list for this problem includes:    Aspirin Ec 81 Mg Tbec (Aspirin) .Marland Kitchen... Take 1 tab once daily    Mobic 15 Mg Tabs (Meloxicam) .Marland Kitchen... 1/2 -1 by mouth once daily as needed    Augmentin 875-125 Mg Tabs (Amoxicillin-pot clavulanate) .Marland Kitchen... 1 by mouth two times a day    Mycelex 10 Mg Troc (Clotrimazole) .Marland Kitchen... 1 by mouth 5x a day  Orders: Rapid Strep (82956)  Instructed to complete antibiotics and call if not improved in 48 hours.   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 1 by mouth at bedtime    Antara 130 Mg Caps (Fenofibrate micronized) .Marland Kitchen... 1 by mouth by mouth once daily.  Orders: Venipuncture (21308) TLB-Lipid Panel (80061-LIPID) TLB-BMP (Basic Metabolic Panel-BMET) (80048-METABOL) TLB-CBC Platelet - w/Differential (85025-CBCD) TLB-Hepatic/Liver Function Pnl (80076-HEPATIC) TLB-TSH (Thyroid Stimulating Hormone) (84443-TSH) TLB-A1C / Hgb A1C (Glycohemoglobin) (83036-A1C) TLB-Microalbumin/Creat Ratio, Urine (82043-MALB) TLB-PSA (Prostate Specific Antigen) (84153-PSA)  Labs Reviewed: SGOT: 18 (08/13/2009)   SGPT:  21 (08/13/2009)   HDL:29 (08/13/2009), 36.90 (05/24/2009)  LDL:See Comment mg/dL (16/04/9603), 74 (54/03/8118)  Chol:190 (08/13/2009), 149 (05/24/2009)  Trig:565 (08/13/2009), 191.0 (05/24/2009)  Problem # 8:  HYPERTENSION (ICD-401.9)  His updated medication list for this problem includes:    Metoprolol Tartrate 25 Mg Tabs (Metoprolol tartrate) .Marland Kitchen... 1/2 tablet by mouth twice daily    Lisinopril-hydrochlorothiazide 20-25 Mg Tabs (Lisinopril-hydrochlorothiazide) .Marland Kitchen... 1 by mouth once daily  Orders: Venipuncture (14782) TLB-Lipid Panel (80061-LIPID) TLB-BMP (Basic Metabolic Panel-BMET) (80048-METABOL) TLB-CBC Platelet - w/Differential (85025-CBCD) TLB-Hepatic/Liver Function Pnl (80076-HEPATIC) TLB-TSH (Thyroid Stimulating Hormone) (84443-TSH) TLB-A1C / Hgb A1C (Glycohemoglobin) (83036-A1C) TLB-Microalbumin/Creat Ratio, Urine (82043-MALB) TLB-PSA (Prostate Specific Antigen) (84153-PSA)  BP today: 128/86 Prior BP: 126/84 (09/13/2009)  Labs  Reviewed: K+: 3.9 (08/13/2009) Creat: : 0.81 (08/13/2009)   Chol: 190 (08/13/2009)   HDL: 29 (08/13/2009)   LDL: See Comment mg/dL (95/62/1308)   TG: 657 (08/13/2009)  Problem # 9:  DIABETES MELLITUS, TYPE II (ICD-250.00)  His updated medication list for this problem includes:    Lisinopril-hydrochlorothiazide 20-25 Mg Tabs (Lisinopril-hydrochlorothiazide) .Marland Kitchen... 1 by mouth once daily    Aspirin Ec 81 Mg Tbec (Aspirin) .Marland Kitchen... Take 1 tab once daily    Metformin Hcl 500 Mg Tabs (Metformin hcl) .Marland Kitchen... Take 1 tab two times a day  Orders: Podiatry Referral (Podiatry) Venipuncture [phone]) TLB-Lipid Panel (80061-LIPID) TLB-BMP (Basic Metabolic Panel-BMET) (80048-METABOL) TLB-CBC Platelet - w/Differential (85025-CBCD) TLB-Hepatic/Liver Function Pnl (80076-HEPATIC) TLB-TSH (Thyroid Stimulating Hormone) (84443-TSH) TLB-A1C / Hgb A1C (Glycohemoglobin) (83036-A1C) TLB-Microalbumin/Creat Ratio, Urine (82043-MALB) TLB-PSA (Prostate Specific Antigen) (84153-PSA)  Labs Reviewed: Creat: 0.81 (08/13/2009)    Reviewed HgBA1c results: 6.0 (08/13/2009)  5.6 (05/24/2009)  Problem # 10:  ERECTILE DYSFUNCTION, ORGANIC (ICD-607.84) using L arginine and states it works well Discussed proper use of medications, as well as side effects.   Complete Medication List: 1)  Metoprolol Tartrate 25 Mg Tabs (Metoprolol tartrate) .... 1/2 tablet by mouth twice daily 2)  Lisinopril-hydrochlorothiazide 20-25 Mg Tabs (Lisinopril-hydrochlorothiazide) .Marland Kitchen.. 1 by mouth once daily 3)  Omeprazole 40 Mg Cpdr (Omeprazole) .... Take 1 tab  once daily 4)  Aspirin Ec 81 Mg Tbec (Aspirin) .... Take 1 tab once daily 5)  Pravachol 40 Mg Tabs (Pravastatin sodium) .Marland Kitchen.. 1 by mouth at bedtime 6)  Metformin Hcl 500 Mg Tabs (Metformin hcl) .... Take 1 tab two times a day 7)  Flonase 50 Mcg/act Susp (Fluticasone propionate) .... 2 sprays each nostril once daily 8)  Adderall 20 Mg Tabs (Amphetamine-dextroamphetamine) .Marland Kitchen.. 1 by mouth once  daily 9)  Vitamin D 29528 Unit Caps (Ergocalciferol) .... Take 1 tab weekly 10)  Claritin 10 Mg Tabs (Loratadine) .... Once daily 11)  Ultram 50 Mg Tabs (Tramadol hcl) .Marland Kitchen.. 1-2  by mouth every 6 hours as needed 12)  Vitamin D 41324 Unit Caps (Ergocalciferol) .... Take one capsule weekly. 13)  Mobic 15 Mg Tabs (Meloxicam) .... 1/2 -1 by mouth once daily as needed 14)  Krill Oil 1000 Mg Caps (Krill oil) .... 2 by mouth two times a day 15)  Antara 130 Mg Caps (Fenofibrate micronized) .Marland Kitchen.. 1 by mouth by mouth once daily. 16)  Cpap  17)  Flexeril 10 Mg Tabs (Cyclobenzaprine hcl) .Marland Kitchen.. 1 by mouth at bedtime as needed 18)  Onetouch Delica Lancets Misc (Lancets) .... Accu check two times a day 19)  One Touch Ultra Mini Strips  .... Bld sugar checks two times a day 20)  Gabapentin 300 Mg Caps (Gabapentin) .Marland Kitchen.. 1 by mouth three times a day as needed  21)  L-arginine 1000 Mg Tabs (Arginine) .Marland Kitchen.. 1 by mouth two times a day 22)  Reveratrol  .Marland Kitchen.. 1 by mouth two times a day 23)  Vitamin D3 1000 Unit Caps (Cholecalciferol) .Marland Kitchen.. 1 by mouth once daily 24)  Augmentin 875-125 Mg Tabs (Amoxicillin-pot clavulanate) .Marland Kitchen.. 1 by mouth two times a day 25)  Mycelex 10 Mg Troc (Clotrimazole) .Marland Kitchen.. 1 by mouth 5x a day  Other Orders: Tdap => 58yrs IM ([address]) Admin 1st Vaccine (60454) Prescriptions: MYCELEX 10 MG TROC (CLOTRIMAZOLE) 1 by mouth 5x a day  #70 x 0   Entered and Authorized by:   Loreen Freud DO   Signed by:   Loreen Freud DO on 11/15/2009   Method used:   Electronically to        [facility]  [address] (retail)       [address]       [place], [place]  [address]       Ph: [phone]       Fax: [phone]   RxID:   [id number] AUGMENTIN 875-125 MG TABS (AMOXICILLIN-POT CLAVULANATE) 1 by mouth two times a day  #20 x 0   Entered and Authorized by:   Loreen Freud DO   Signed by:   Loreen Freud DO on 11/15/2009   Method used:   Electronically to        [facility]  [address] (retail)        [address]       [place], [place]  [address]       Ph: [phone]       Fax: [phone]   RxID:   [id number] FLONASE 50 MCG/ACT SUSP (FLUTICASONE PROPIONATE) 2 sprays each nostril once daily  #3 x 3   Entered and Authorized by:   Loreen Freud DO   Signed by:   Loreen Freud DO on 11/15/2009   Method used:   Electronically to        [facility]  [address] (retail)       [address]       [place], [place]  [address]       Ph: [phone]       Fax: [phone]   RxID:   [id number] GABAPENTIN 300 MG CAPS (GABAPENTIN) 1 by mouth three times a day as needed  #90 x 5   Entered and Authorized by:   Loreen Freud DO   Signed by:   Loreen Freud DO on 11/15/2009   Method used:   Electronically to        [facility]  [address] (retail)       [address]       [place], [place]  [address]       Ph: [phone]       Fax: [phone]   RxID:   [id number]        Immunizations Administered:  Tetanus Vaccine:    Vaccine Type: Tdap    Site: left deltoid    Mfr: GlaxoSmithKline    Dose: 0.5 ml    Route: IM    Given by: Army Fossa CMA    Exp. Date: 10/02/2011    Lot #: [id number]   Immunizations Administered:  Tetanus Vaccine:    Vaccine Type: Tdap    Site:  left deltoid    Mfr: GlaxoSmithKline    Dose: 0.5 ml    Route: IM    Given by: Army Fossa CMA    Exp. Date: 10/02/2011    Lot #: [id number]   Laboratory Results    Other Tests  Rapid Strep: negative Comments: Army Fossa CMA  Nov 15, 2009 10:00 AM

## 2010-08-09 NOTE — Letter (Signed)
Summary: [facility]   Imported By: Lanelle Bal 09/17/2009 08:17:23  _____________________________________________________________________  External Attachment:    Type:   Image     Comment:   External Document

## 2010-08-09 NOTE — Progress Notes (Signed)
  Phone Note Outgoing Call   Summary of Call: Pts Flexeril was sent [facility], I called to make sure he had not picked it up yet and he had not. I sent meds to [facility]. Okay per Dr.lowne.Army Fossa CMA  September 20, 2009 1:38 PM     Prescriptions: FLEXERIL 10 MG TABS (CYCLOBENZAPRINE HCL) 1 by mouth at bedtime as needed  #30 x 0   Entered by:   Army Fossa CMA   Authorized by:   Loreen Freud DO   Signed by:   Army Fossa CMA on 09/20/2009   Method used:   Electronically to        [facility]  [address] (retail)       [address]       [place], [place]  [address]       Ph: [phone]       Fax: [phone]   RxID:   [id number]

## 2010-08-09 NOTE — Progress Notes (Signed)
Summary: Refill Request  Phone Note Refill Request Message from:  Pharmacy on [facility] [address] Fax #: [phone]  Refills Requested: Medication #1:  FLEXERIL 10 MG TABS 1 by mouth three times a day as needed   Dosage confirmed as above?Dosage Confirmed   Last Refilled: 1/112011 Initial call taken by: Harold Barban,  August 13, 2009 10:11 AM  Follow-up for Phone Call        left message for pt to call back per dr Laury Axon to early to refill- pt has appt today. Army Fossa CMA  August 13, 2009 10:48 AM

## 2010-08-09 NOTE — Progress Notes (Signed)
Summary: refill  Phone Note Refill Request   Refills Requested: Medication #1:  ULTRAM 50 MG TABS 1-2  by mouth every 6 hours as needed   Last Refilled: 09/13/2009 Pt is requesting 90 pills instead of 60. last ov- 09/13/09. Army Fossa CMA  October 15, 2009 12:59 PM    Follow-up for Phone Call        ok to refill #90 x1 Follow-up by: Loreen Freud DO,  October 15, 2009 4:39 PM    Prescriptions: ULTRAM 50 MG TABS (TRAMADOL HCL) 1-2  by mouth every 6 hours as needed  #90 x 0   Entered by:   Army Fossa CMA   Authorized by:   Loreen Freud DO   Signed by:   Army Fossa CMA on 10/15/2009   Method used:   Electronically to        [facility]  [address] (retail)       [address]       [place], [place]  [address]       Ph: [phone]       Fax: [phone]   RxID:   [id number]

## 2010-08-09 NOTE — Progress Notes (Signed)
Summary: REFILL(lmom 3/11)  Phone Note Refill Request Message from:  Fax from Pharmacy on September 17, 2009 9:22 AM  Refills Requested: Medication #1:  FLEXERIL 10 MG TABS 1 by mouth at bedtime as needed [facility] FAX [phone]   Method Requested: Fax to Local Pharmacy Next Appointment Scheduled: 11/15/2009 Initial call taken by: Barb Merino,  September 17, 2009 9:23 AM  Follow-up for Phone Call        Left pt a message to call back we refilled the medication on 09/13/09. Army Fossa CMA  September 17, 2009 10:19 AM   Additional Follow-up for Phone Call Additional follow up Details #1::        spoke with pts wife and she states he is up [place] and she is unsure if he requested this and if he got it filled before he left. Army Fossa CMA  September 17, 2009 4:34 PM

## 2010-08-09 NOTE — Consult Note (Signed)
Summary: [facility]  [facility]   Imported By: Lanelle Bal 09/16/2009 13:42:39  _____________________________________________________________________  External Attachment:    Type:   Image     Comment:   External Document

## 2010-08-09 NOTE — Consult Note (Signed)
Summary: [facility]   Imported By: Lanelle Bal 12/22/2009 08:18:58  _____________________________________________________________________  External Attachment:    Type:   Image     Comment:   External Document

## 2010-08-09 NOTE — Progress Notes (Signed)
Summary: refill - change med  Phone Note Refill Request Message from:  Patient on May 24, 2010 1:32 PM  Refills Requested: Medication #1:  ADDERALL 20 MG TABS 1 by mouth once daily patient is aware adderall is not available - please change to ritalin patient has taken it in the past - patient will pick up [id number]  Initial call taken by: Okey Regal Spring,  May 24, 2010 1:34 PM  Follow-up for Phone Call        Need to change Rx to Ritalin please advise on dosage. Follow-up by: Almeta Monas CMA Duncan Dull),  May 24, 2010 3:12 PM  Additional Follow-up for Phone Call Additional follow up Details #1::        ritalin 20 mg #30  1 by mouth once daily  Additional Follow-up by: Loreen Freud DO,  May 24, 2010 4:42 PM    New/Updated Medications: RITALIN 20 MG TABS (METHYLPHENIDATE HCL) 1 by mouth once daily Prescriptions: RITALIN 20 MG TABS (METHYLPHENIDATE HCL) 1 by mouth once daily  #30 x 0   Entered by:   Almeta Monas CMA (AAMA)   Authorized by:   Loreen Freud DO   Signed by:   Almeta Monas CMA (AAMA) on 05/24/2010   Method used:   Print then Give to Patient   RxID:   [id number]

## 2010-08-09 NOTE — Progress Notes (Signed)
Summary: sore throat, white patched  Phone Note Call from Patient   Caller: Patient Summary of Call: pt c/o sore throat with white patch on tongue, and bodyaches. pt denies any fever, sob, or cough. pt states that he is currently traveling for his job and is unable to come in. Pt advise to go to local UC. pt states that he has pending OV on monday and will try to make it until then but if symptoms increase or change will find a local urgent care....................Marland KitchenFelecia Deloach CMA  Nov 11, 2009 9:20 AM

## 2010-08-09 NOTE — Progress Notes (Signed)
Summary: elevated BS  Phone Note Call from Patient Call back at Work Phone [phone]   Caller: Patient Summary of Call: PT states that after eating breakfast this am BS was elevate and glucometer was unable to read BS. Pt c/o being very jittery, and very sleepy. Pt denies any dizziness, nausea, vomiting, or headache. Pt BS in AM fasting are ranging from 91-140. pls advise.................Marland KitchenFelecia Deloach CMA  April 29, 2010 9:47 AM   Follow-up for Phone Call        pt needs ov Follow-up by: Loreen Freud DO,  April 29, 2010 11:26 AM  Additional Follow-up for Phone Call Additional follow up Details #1::        Adv pt he will need an OV, Offered and appt today but pt declined, says he can  not come in today, says he will call back in shortly to try to schedule something for monday. Adv pt to be sur he called back because it is important that he is seen asap. He agreed and said he would.  Additional Follow-up by: Almeta Monas CMA Duncan Dull),  April 29, 2010 11:38 AM

## 2010-08-09 NOTE — Progress Notes (Signed)
Summary: Refill Request(lmom 6/7)  Phone Note Refill Request Call back at [phone] Message from:  Pharmacy on December 14, 2009 1:24 PM  Refills Requested: Medication #1:  CELEXA 40MG  TAB,  take one tablet by mouth every day   Dosage confirmed as above?Dosage Confirmed   Supply Requested: 1 month   Last Refilled: 11/08/2009 [facility] [address]  Next Appointment Scheduled: lab today Initial call taken by: Harold Barban,  December 14, 2009 1:26 PM  Follow-up for Phone Call        med was removed in march, okay to fill? Army Fossa CMA  December 14, 2009 1:32 PM   Additional Follow-up for Phone Call Additional follow up Details #1::        make sure pt is on it first.  then ok to refill for 6 months. Additional Follow-up by: Loreen Freud DO,  December 14, 2009 1:40 PM    Additional Follow-up for Phone Call Additional follow up Details #2::    left message for pt to call back. Army Fossa CMA  December 14, 2009 1:45 PM   Additional Follow-up for Phone Call Additional follow up Details #3:: Details for Additional Follow-up Action Taken: Pt states that he is taking the celexa. Army Fossa CMA  December 14, 2009 3:42 PM   New/Updated Medications: CELEXA 40 MG TABS (CITALOPRAM HYDROBROMIDE) 1 by mouth at bedtime. Prescriptions: CELEXA 40 MG TABS (CITALOPRAM HYDROBROMIDE) 1 by mouth at bedtime.  #30 x 5   Entered by:   Army Fossa CMA   Authorized by:   Loreen Freud DO   Signed by:   Army Fossa CMA on 12/14/2009   Method used:   Electronically to        [facility] [address] [facility] (retail)       [address]       [place], [place]  [address]       Ph: [phone]       Fax: [phone]   RxID:   [id number]

## 2010-08-09 NOTE — Procedures (Signed)
Summary: [facility]  [facility]   Imported By: Lanelle Bal 02/28/2010 11:50:20  _____________________________________________________________________  External Attachment:    Type:   Image     Comment:   External Document

## 2010-08-09 NOTE — Progress Notes (Signed)
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

## 2010-08-11 NOTE — Progress Notes (Signed)
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 [facility] [address] in [place]  Next Appointment Scheduled: none Initial call taken by: Harold Barban,  July 07, 2010 3:05 PM    Prescriptions: LISINOPRIL-HYDROCHLOROTHIAZIDE 20-25 MG TABS (LISINOPRIL-HYDROCHLOROTHIAZIDE) 1 by mouth once daily  #90 Each x 0   Entered by:   Almeta Monas CMA (AAMA)   Authorized by:   Loreen Freud DO   Signed by:   Almeta Monas CMA (AAMA) on 07/07/2010   Method used:   Electronically to        [facility]  [address] (retail)       [address]       [place], [place]  [address]       Ph: [phone]       Fax: [phone]   RxID:   [id number]

## 2010-08-11 NOTE — Progress Notes (Signed)
Summary: refill  Phone Note Refill Request   Refills Requested: Medication #1:  RITALIN 20 MG TABS 1 by mouth once daily. Pt would like to change to ritalin 10mg   two times a day instead of 20mg .. Pls Advise..........Marland KitchenFelecia Deloach CMA  July 07, 2010 5:08 PM    Follow-up for Phone Call        ok to change to 10 mg two times a day Ritalin Follow-up by: Loreen Freud DO,  July 08, 2010 9:05 AM  Additional Follow-up for Phone Call Additional follow up Details #1::        pt aware rx ready for pick up.... Almeta Monas CMA Duncan Dull)  July 08, 2010 10:31 AM     New/Updated Medications: RITALIN 10 MG TABS (METHYLPHENIDATE HCL) 1 by mouth two times a day Prescriptions: RITALIN 10 MG TABS (METHYLPHENIDATE HCL) 1 by mouth two times a day  #60 x 0   Entered and Authorized by:   Loreen Freud DO   Signed by:   Loreen Freud DO on 07/08/2010   Method used:   Print then Give to Patient   RxID:   [id number]

## 2010-08-11 NOTE — Assessment & Plan Note (Signed)
Summary: spot on ear/kn   Vital Signs:  Patient profile:   55 year old male Height:      70 inches Weight:      252.0 pounds BMI:     36.29 Pulse rate:   80 / minute Pulse rhythm:   regular BP sitting:   138 / 82  (right arm) Cuff size:   large  Vitals Entered By: Almeta Monas CMA Duncan Dull) (July 25, 2010 2:14 PM) CC: x10mo c/o spot on the left ear that will not heal---pt admitted to picking it     (272.4 250.00  hgba1c, bmp, hep,lipid--labs are due Now)   History of Present Illness: Pt here c/o sore on L ear that will not heal.  It has been there 6 months.  They have tried abx ointment with no relief.  Pt has other areas on his face as well.    Current Medications (verified): 1)  Metoprolol Tartrate 25 Mg Tabs (Metoprolol Tartrate) .... 1/2 Tablet By Mouth Twice Daily 2)  Lisinopril-Hydrochlorothiazide 20-25 Mg Tabs (Lisinopril-Hydrochlorothiazide) .Marland Kitchen.. 1 By Mouth Once Daily 3)  Omeprazole 40 Mg Cpdr (Omeprazole) .... Take 1 Tab  Once Daily 4)  Aspirin Ec 81 Mg Tbec (Aspirin) .... Take 1 Tab Once Daily 5)  Pravachol 40 Mg Tabs (Pravastatin Sodium) .Marland Kitchen.. 1 By Mouth At Bedtime** Labs Are Due Now** 6)  Metformin Hcl 500 Mg Tabs (Metformin Hcl) .... Take 1 Tab Two Times A Day 7)  Flonase 50 Mcg/act Susp (Fluticasone Propionate) .... 2 Sprays Each Nostril Once Daily 8)  Claritin 10 Mg Tabs (Loratadine) .... Once Daily 9)  Ultram 50 Mg Tabs (Tramadol Hcl) .Marland Kitchen.. 1-2  By Mouth Every 6 Hours As Needed 10)  Vitamin D 14782 Unit Caps (Ergocalciferol) .... Take One Capsule Weekly. 11)  Mobic 15 Mg Tabs (Meloxicam) .... 1/2 -1 By Mouth Once Daily As Needed 12)  Krill Oil 1000 Mg Caps (Krill Oil) .... 2 By Mouth Two Times A Day 13)  Cpap 14)  Onetouch Delica Lancets  Misc (Lancets) .... Accu Check Two Times A Day 15)  One Touch Ultra Mini Strips .... Bld Sugar Checks Two Times A Day 16)  Gabapentin 300 Mg Caps (Gabapentin) .Marland Kitchen.. 1 By Mouth Three Times A Day As Needed 17)  L-Arginine 1000 Mg  Tabs (Arginine) .Marland Kitchen.. 1 By Mouth Two Times A Day 18)  Reveratrol .Marland Kitchen.. 1 By Mouth Two Times A Day 19)  Vitamin D3 1000 Unit Caps (Cholecalciferol) .Marland Kitchen.. 1 By Mouth Once Daily 20)  Celexa 40 Mg Tabs (Citalopram Hydrobromide) .Marland Kitchen.. 1 By Mouth At Bedtime. 21)  Cialis 20 Mg Tabs (Tadalafil) .... As Directed. 22)  Proair Hfa 108 (90 Base) Mcg/act Aers (Albuterol Sulfate) .... 2 Puffs Qid As Needed 23)  Claritin 10 Mg Tabs (Loratadine) .Marland Kitchen.. 1 By Mouth Once Daily As Needed 24)  Diflucan 150 Mg Tabs (Fluconazole) .... One By Mouth Daily X2 25)  Cyclobenzaprine Hcl 10 Mg Tabs (Cyclobenzaprine Hcl) .Marland Kitchen.. 1 By Mouth Once Daily At Bedtime As Needed 26)  Ritalin 10 Mg Tabs (Methylphenidate Hcl) .Marland Kitchen.. 1 By Mouth Two Times A Day 27)  Minocycline Hcl 100 Mg Caps (Minocycline Hcl) .Marland Kitchen.. 1 By Mouth Two Times A Day  Allergies (verified): 1)  ! Sulfa 2)  ! Vicodin  Past History:  Past Medical History: Last updated: 07/13/2008 Diabetes mellitus, type II Hypertension heart disease  cancer-kidney 1999 sleep apinea cpap Hyperlipidemia  Past Surgical History: Last updated: 11/15/2009 OSA surgery--08/1988 Tonsillectomy 08/1988 Cholecystectomy  2000 Nephrectomy -  L 1999-- cancer Sinus surgery 2005 L elbowsurgery myrigotomy tube r earg  Family History: Last updated: 07/13/2008 dm-father HTN-mother,father Family History High cholesterol-father  Social History: Last updated: 11/23/2008 Occupation: truck driver Married Former Smoker Alcohol use-yes Drug use-no Regular exercise-yes  Risk Factors: Alcohol Use: <1 (11/15/2009) Caffeine Use: 3 (11/15/2009) Exercise: no (11/15/2009)  Risk Factors: Smoking Status: quit (11/15/2009) Passive Smoke Exposure: no (11/15/2009)  Family History: Reviewed history from 07/13/2008 and no changes required. dm-father HTN-mother,father Family History High cholesterol-father  Social History: Reviewed history from 11/23/2008 and no changes  required. Occupation: truck Hospital doctor Married Former Smoker Alcohol use-yes Drug use-no Regular exercise-yes  Review of Systems      See HPI  Physical Exam  General:  Well-developed,well-nourished,in no acute distress; alert,appropriate and cooperative throughout examination Ears:  + sore L ear lobe---not healing mult papules on cheek--covered with makeup Extremities:  No clubbing, cyanosis, edema, or deformity noted with normal full range of motion of all joints.   Psych:  Cognition and judgment appear intact. Alert and cooperative with normal attention span and concentration. No apparent delusions, illusions, hallucinations   Impression & Recommendations:  Problem # 1:  ACNE VULGARIS (ICD-706.1) Assessment Unchanged  His updated medication list for this problem includes:    Minocycline Hcl 100 Mg Caps (Minocycline hcl) .Marland Kitchen... 1 by mouth two times a day  Orders: Dermatology Referral [facility])  Discussed care of the skin and different treatment options.   Problem # 2:  NEVI, MULTIPLE (ICD-216.9)  Orders: Dermatology Referral (Derma)  Complete Medication List: 1)  Metoprolol Tartrate 25 Mg Tabs (Metoprolol tartrate) .... 1/2 tablet by mouth twice daily 2)  Lisinopril-hydrochlorothiazide 20-25 Mg Tabs (Lisinopril-hydrochlorothiazide) .Marland Kitchen.. 1 by mouth once daily 3)  Omeprazole 40 Mg Cpdr (Omeprazole) .... Take 1 tab  once daily 4)  Aspirin Ec 81 Mg Tbec (Aspirin) .... Take 1 tab once daily 5)  Pravachol 40 Mg Tabs (Pravastatin sodium) .Marland Kitchen.. 1 by mouth at bedtime** labs are due now** 6)  Metformin Hcl 500 Mg Tabs (Metformin hcl) .... Take 1 tab two times a day 7)  Flonase 50 Mcg/act Susp (Fluticasone propionate) .... 2 sprays each nostril once daily 8)  Claritin 10 Mg Tabs (Loratadine) .... Once daily 9)  Ultram 50 Mg Tabs (Tramadol hcl) .Marland Kitchen.. 1-2  by mouth every 6 hours as needed 10)  Vitamin D 56387 Unit Caps (Ergocalciferol) .... Take one capsule weekly. 11)  Mobic 15 Mg Tabs  (Meloxicam) .... 1/2 -1 by mouth once daily as needed 12)  Krill Oil 1000 Mg Caps (Krill oil) .... 2 by mouth two times a day 13)  Cpap  14)  Onetouch Delica Lancets Misc (Lancets) .... Accu check two times a day 15)  One Touch Ultra Mini Strips  .... Bld sugar checks two times a day 16)  Gabapentin 300 Mg Caps (Gabapentin) .Marland Kitchen.. 1 by mouth three times a day as needed 17)  L-arginine 1000 Mg Tabs (Arginine) .Marland Kitchen.. 1 by mouth two times a day 18)  Reveratrol  .Marland Kitchen.. 1 by mouth two times a day 19)  Vitamin D3 1000 Unit Caps (Cholecalciferol) .Marland Kitchen.. 1 by mouth once daily 20)  Celexa 40 Mg Tabs (Citalopram hydrobromide) .Marland Kitchen.. 1 by mouth at bedtime. 21)  Cialis 20 Mg Tabs (Tadalafil) .... As directed. 22)  Proair Hfa 108 (90 Base) Mcg/act Aers (Albuterol sulfate) .... 2 puffs qid as needed 23)  Claritin 10 Mg Tabs (Loratadine) .Marland Kitchen.. 1 by mouth once daily as needed 24)  Diflucan 150 Mg  Tabs (Fluconazole) .... One by mouth daily x2 25)  Cyclobenzaprine Hcl 10 Mg Tabs (Cyclobenzaprine hcl) .Marland Kitchen.. 1 by mouth once daily at bedtime as needed 26)  Ritalin 10 Mg Tabs (Methylphenidate hcl) .Marland Kitchen.. 1 by mouth two times a day 27)  Minocycline Hcl 100 Mg Caps (Minocycline hcl) .Marland Kitchen.. 1 by mouth two times a day  Other Orders: Venipuncture (427[address]) Prescriptions: MINOCYCLINE HCL 100 MG CAPS (MINOCYCLINE HCL) 1 by mouth two times a day  #60 x 2   Entered and Authorized by:   Loreen Freud DO   Signed by:   Loreen Freud DO on 07/25/2010   Method used:   F[facility]ed to ...       [facility]  [address] ([facility]ail)       [address]       [place], [place]  [address]       Ph: [phone]       Fax: [phone]   RxID:   [id number] FLONASE 50 MCG/ACT SUSP (FLUTICASONE PROPIONATE) 2 sprays each nostril once daily  #3 x 0   Entered by:   Almeta Monas CMA (AAMA)   Authorized by:   Loreen Freud DO   Signed by:   Almeta Monas CMA (AAMA) on 07/25/2010   Method used:   Faxed to ...       [facility]  [address]  ([facility]ail)       [address]       [place], [place]  [address]       Ph: [phone]       Fax: [phone]   RxID:   [id number] PRAVACHOL 40 MG TABS (PRAVASTATIN SODIUM) 1 by mouth at bedtime** LABS ARE DUE NOW**  #30 x 0   Entered by:   Almeta Monas CMA (AAMA)   Authorized by:   Loreen Freud DO   Signed by:   Almeta Monas CMA (AAMA) on 07/25/2010   Method used:   [facility]ed to ...       [facility]  [address] ([facility]ail)       [address]       [place], [place]  [address]       Ph: [phone]       Fax: [phone]   RxID:   [id number] METFORMIN HCL 500 MG TABS (METFORMIN HCL) take 1 tab two times a day  #60 Each x 0   Entered by:   Almeta Monas CMA (AAMA)   Authorized by:   Loreen Freud DO   Signed by:   Almeta Monas CMA (AAMA) on 07/25/2010   Method used:   F[facility]ed to ...       [facility]  [address] ([facility]ail)       [address]       [place], [place]  [address]       Ph: [phone]       Fax: [phone]   RxID:   [id number] LISINOPRIL-HYDROCHLOROTHIAZIDE 20-25 MG TABS (LISINOPRIL-HYDROCHLOROTHIAZIDE) 1 by mouth once daily  #90 Each x 0   Entered by:   Almeta Monas CMA (AAMA)   Authorized by:   Loreen Freud DO   Signed by:   Almeta Monas CMA (AAMA) on 07/25/2010   Method used:   [facility]ed to ...       [facility]  [address] (retail)       [address]       [place], [place]  [address]       Ph: [phone]       Fax: [phone]   RxID:   [id number]    Orders Added: 1)  Venipuncture [643[address]] 2)  Dermatology Referral [Derma] 3)  Est. Patient Level III [[address]]

## 2010-08-11 NOTE — Progress Notes (Signed)
Summary: refill  Phone Note Refill Request Call back at Work Phone [phone] Message from:  Patient on July 14, 2010 11:04 AM  Refills Requested: Medication #1:  CELEXA 40 MG TABS 1 by mouth at bedtime.   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  Next Appointment Scheduled: none Initial call taken by: Lavell Islam,  July 14, 2010 11:05 AM  Follow-up for Phone Call        last seen 04/18/10 and filled 12/14/09. please advise.... Almeta Monas CMA Duncan Dull)  July 14, 2010 1:26 PM   Additional Follow-up for Phone Call Additional follow up Details #1::        ok to refill x1  5 refills Additional Follow-up by: Loreen Freud DO,  July 14, 2010 1:56 PM    Prescriptions: CELEXA 40 MG TABS (CITALOPRAM HYDROBROMIDE) 1 by mouth at bedtime.  #30 x 5   Entered by:   Almeta Monas CMA (AAMA)   Authorized by:   Loreen Freud DO   Signed by:   Almeta Monas CMA (AAMA) on 07/14/2010   Method used:   [facility]ed to ...       [facility]  [address]       [address]       [place], [place]  [address]       Ph: [phone]       Fax: [phone]   RxID:   [id number]

## 2010-08-11 NOTE — Progress Notes (Signed)
Summary: refill  Phone Note Refill Request Message from:  Fax from Pharmacy on June 22, 2010 10:50 AM  Refills Requested: Medication #1:  CYCLOBENZAPRINE HCL 10 MG TABS 1 by mouth once daily at bedtime as needed [facility] - fax [phone]  Initial call taken by: Okey Regal Spring,  June 22, 2010 10:51 AM  Follow-up for Phone Call        last seen 04/18/10 and filled 04/25/10... please advise Follow-up by: Almeta Monas CMA Duncan Dull),  June 22, 2010 11:47 AM  Additional Follow-up for Phone Call Additional follow up Details #1::        refillx1 Additional Follow-up by: Loreen Freud DO,  June 22, 2010 12:19 PM    Prescriptions: CYCLOBENZAPRINE HCL 10 MG TABS (CYCLOBENZAPRINE HCL) 1 by mouth once daily at bedtime as needed  #30 x 0   Entered by:   Almeta Monas CMA (AAMA)   Authorized by:   Loreen Freud DO   Signed by:   Almeta Monas CMA (AAMA) on 06/22/2010   Method used:   Electronically to        [facility]* (retail)       [address]       [place], [place]  [address]       Ph: [phone] or [phone]       Fax: [phone]   RxID:   [id number]   Appended Document: refill spk with Elita Quick AT [facility] and RX was cancelled.... Refaxed to [facility] in [place]

## 2010-08-17 ENCOUNTER — Telehealth: Payer: Self-pay | Admitting: Family Medicine

## 2010-08-25 NOTE — Progress Notes (Signed)
Summary: ritalin refill  Phone Note Refill Request Call back at Home Phone [phone] Call back at [facility] cell = [phone] Message from:  Patient on August 17, 2010 1:39 PM  Refills Requested: Medication #1:  RITALIN 10 MG TABS 1 by mouth two times a day Patient needs Ritalin prescription refilled.  Advised patient that it will be ready in 24 hours for pickup unless someone calls them  Initial call taken by: Jerolyn Shin,  August 17, 2010 1:40 PM  Follow-up for Phone Call        last filled 07/08/10 and seen 07/25/10..please advise Follow-up by: Almeta Monas CMA Duncan Dull),  August 18, 2010 9:18 AM    Prescriptions: RITALIN 10 MG TABS (METHYLPHENIDATE HCL) 1 by mouth two times a day  #60 x 0   Entered by:   Almeta Monas CMA (AAMA)   Authorized by:   Loreen Freud DO   Signed by:   Almeta Monas CMA (AAMA) on 08/18/2010   Method used:   Print then Give to Patient   RxID:   [id number]

## 2010-09-15 ENCOUNTER — Encounter (INDEPENDENT_AMBULATORY_CARE_PROVIDER_SITE_OTHER): Payer: Self-pay | Admitting: *Deleted

## 2010-09-16 ENCOUNTER — Encounter (INDEPENDENT_AMBULATORY_CARE_PROVIDER_SITE_OTHER): Payer: Self-pay | Admitting: *Deleted

## 2010-09-16 ENCOUNTER — Encounter: Payer: Self-pay | Admitting: Family Medicine

## 2010-09-16 ENCOUNTER — Other Ambulatory Visit (INDEPENDENT_AMBULATORY_CARE_PROVIDER_SITE_OTHER): Payer: Commercial Managed Care - PPO

## 2010-09-16 DIAGNOSIS — E119 Type 2 diabetes mellitus without complications: Secondary | ICD-10-CM

## 2010-09-16 DIAGNOSIS — E785 Hyperlipidemia, unspecified: Secondary | ICD-10-CM

## 2010-09-19 LAB — CONVERTED CEMR LAB
ALT: 29 units/L (ref 0–53)
AST: 25 units/L (ref 0–37)
Alkaline Phosphatase: 79 units/L (ref 39–117)
BUN: 22 mg/dL (ref 6–23)
Bilirubin, Direct: 0.1 mg/dL (ref 0.0–0.3)
Calcium: 9.8 mg/dL (ref 8.4–10.5)
Cholesterol: 199 mg/dL (ref 0–200)
Creatinine, Ser: 0.88 mg/dL (ref 0.40–1.50)
Glucose, Bld: 89 mg/dL (ref 70–99)
Indirect Bilirubin: 0.4 mg/dL (ref 0.0–0.9)

## 2010-09-20 NOTE — Letter (Signed)
Summary: Primary Care Appointment Letter  [facility] [place]  [address] [place], [place] [address]   Phone: [phone]  Fax: [phone]    09/15/2010 MRN: [id number]      Jesse Vargas [address] [place], [place]  [address]    Dear [name],   Your Primary Care Physician Loreen Freud DO has indicated that:    ___X____it is time to schedule an appointment for fasting labs 272.4 250.00  hgba1c, bmp, hep,lipid.    _______you missed your appointment on______ and need to call and          reschedule.    _______you need to have lab work done.    _______you need to schedule an appointment discuss lab or test results.    _______you need to call to reschedule your appointment that is                       scheduled on _________.     Please call our office as soon as possible. Our phone number is [phone]. Please press option 1. Our office is open 8a-5p, Monday through Friday.     Thank you, [facility]uler

## 2010-09-23 ENCOUNTER — Ambulatory Visit (INDEPENDENT_AMBULATORY_CARE_PROVIDER_SITE_OTHER): Payer: Commercial Managed Care - PPO | Admitting: Internal Medicine

## 2010-09-23 ENCOUNTER — Encounter: Payer: Self-pay | Admitting: Internal Medicine

## 2010-09-23 DIAGNOSIS — J019 Acute sinusitis, unspecified: Secondary | ICD-10-CM | POA: Insufficient documentation

## 2010-09-27 NOTE — Assessment & Plan Note (Signed)
Summary: still congested/cbs   Vital Signs:  Patient profile:   55 year old male Weight:      251.25 pounds Temp:     98.7 degrees F oral Pulse rate:   90 / minute Pulse rhythm:   regular BP sitting:   140 / 86  (left arm) Cuff size:   large  Vitals Entered By: Army Fossa CMA (September 23, 2010 3:53 PM) CC: c/o drainage, HA, neck sore, fatigue Comments [facility]    History of Present Illness:  2 weeks ago he had "the flu" He felt better but 3 days ago some symptoms is that of again:  fatigue, aches, frontal  headaches,  glands in the neck feeling swollen.  Review of systems  mild cough with clear sputum Mild sore throat Clear nasal discharge No nausea or vomiting, some diarrhea  Current Medications (verified): 1)  Metoprolol Tartrate 25 Mg Tabs (Metoprolol Tartrate) .... 1/2 Tablet By Mouth Twice Daily 2)  Lisinopril-Hydrochlorothiazide 20-25 Mg Tabs (Lisinopril-Hydrochlorothiazide) .Marland Kitchen.. 1 By Mouth Once Daily 3)  Omeprazole 40 Mg Cpdr (Omeprazole) .... Take 1 Tab  Once Daily 4)  Aspirin Ec 81 Mg Tbec (Aspirin) .... Take 1 Tab Once Daily 5)  Pravachol 40 Mg Tabs (Pravastatin Sodium) .Marland Kitchen.. 1 By Mouth At Bedtime** Labs Are Due Now__call For Appt** 6)  Metformin Hcl 500 Mg Tabs (Metformin Hcl) .... Take 1 Tab Two Times A Day **labs Are Due Now** 7)  Flonase 50 Mcg/act Susp (Fluticasone Propionate) .... 2 Sprays Each Nostril Once Daily 8)  Ultram 50 Mg Tabs (Tramadol Hcl) .Marland Kitchen.. 1-2  By Mouth Every 6 Hours As Needed 9)  Vitamin D 13086 Unit Caps (Ergocalciferol) .... Take One Capsule Weekly. 10)  Mobic 15 Mg Tabs (Meloxicam) .... 1/2 -1 By Mouth Once Daily As Needed 11)  Cpap 12)  Onetouch Delica Lancets  Misc (Lancets) .... Accu Check Two Times A Day 13)  One Touch Ultra Mini Strips .... Bld Sugar Checks Two Times A Day 14)  Gabapentin 300 Mg Caps (Gabapentin) .Marland Kitchen.. 1 By Mouth Three Times A Day As Needed 15)  L-Arginine 1000 Mg Tabs (Arginine) .Marland Kitchen.. 1 By Mouth Two Times A  Day 16)  Reveratrol .Marland Kitchen.. 1 By Mouth Two Times A Day 17)  Vitamin D3 1000 Unit Caps (Cholecalciferol) .Marland Kitchen.. 1 By Mouth Once Daily 18)  Celexa 40 Mg Tabs (Citalopram Hydrobromide) .Marland Kitchen.. 1 By Mouth At Bedtime. 19)  Cialis 20 Mg Tabs (Tadalafil) .... As Directed. 20)  Proair Hfa 108 (90 Base) Mcg/act Aers (Albuterol Sulfate) .... 2 Puffs Qid As Needed 21)  Claritin 10 Mg Tabs (Loratadine) .Marland Kitchen.. 1 By Mouth Once Daily As Needed 22)  Cyclobenzaprine Hcl 10 Mg Tabs (Cyclobenzaprine Hcl) .Marland Kitchen.. 1 By Mouth Once Daily At Bedtime As Needed 23)  Ritalin 10 Mg Tabs (Methylphenidate Hcl) .Marland Kitchen.. 1 By Mouth Two Times A Day 24)  Minocycline Hcl 100 Mg Caps (Minocycline Hcl) .Marland Kitchen.. 1 By Mouth Two Times A Day 25)  Fenofibrate 160 Mg Tabs (Fenofibrate) .... Take  1 By Mouth  Once Daily  Allergies (verified): 1)  ! Sulfa 2)  ! Vicodin  Past History:  Past Medical History: Reviewed history from 07/13/2008 and no changes required. Diabetes mellitus, type II Hypertension heart disease  cancer-kidney 1999 sleep apinea cpap Hyperlipidemia  Past Surgical History: Reviewed history from 11/15/2009 and no changes required. OSA surgery--08/1988 Tonsillectomy 08/1988 Cholecystectomy  2000 Nephrectomy -L 1999-- cancer Sinus surgery 2005 L elbowsurgery myrigotomy tube r earg  Physical Exam  General:  alert and well-developed.   Head:   face symmetric, nontender to palpation of the sinuses Eyes:   external ocular movements intact Ears:  R ear  has a tube at the canal, left ear normal Nose:   slightly congested Mouth:   history of previous surgery, throat the symmetric without redness Lungs:  normal respiratory effort, no intercostal retractions, no accessory muscle use, and normal breath sounds.     Impression & Recommendations:  Problem # 1:  SINUSITIS - ACUTE-NOS (ICD-461.9)   sinusitis, see instructions. Encouraged to call if he is not feeling better soon  His updated medication list for this problem  includes:    Flonase 50 Mcg/act Susp (Fluticasone propionate) .Marland Kitchen... 2 sprays each nostril once daily    Minocycline Hcl 100 Mg Caps (Minocycline hcl) .Marland Kitchen... 1 by mouth two times a day    Amoxicillin 500 Mg Tabs (Amoxicillin) .Marland Kitchen... 2 by mouth two times a day  Complete Medication List: 1)  Metoprolol Tartrate 25 Mg Tabs (Metoprolol tartrate) .... 1/2 tablet by mouth twice daily 2)  Lisinopril-hydrochlorothiazide 20-25 Mg Tabs (Lisinopril-hydrochlorothiazide) .Marland Kitchen.. 1 by mouth once daily 3)  Omeprazole 40 Mg Cpdr (Omeprazole) .... Take 1 tab  once daily 4)  Aspirin Ec 81 Mg Tbec (Aspirin) .... Take 1 tab once daily 5)  Pravachol 40 Mg Tabs (Pravastatin sodium) .Marland Kitchen.. 1 by mouth at bedtime** labs are due now__call for appt** 6)  Metformin Hcl 500 Mg Tabs (Metformin hcl) .... Take 1 tab two times a day **labs are due now** 7)  Flonase 50 Mcg/act Susp (Fluticasone propionate) .... 2 sprays each nostril once daily 8)  Ultram 50 Mg Tabs (Tramadol hcl) .Marland Kitchen.. 1-2  by mouth every 6 hours as needed 9)  Vitamin D [address] Unit Caps (Ergocalciferol) .... Take one capsule weekly. 10)  Mobic 15 Mg Tabs (Meloxicam) .... 1/2 -1 by mouth once daily as needed 11)  Cpap  12)  Onetouch Delica Lancets Misc (Lancets) .... Accu check two times a day 13)  One Touch Ultra Mini Strips  .... Bld sugar checks two times a day 14)  Gabapentin 300 Mg Caps (Gabapentin) .Marland Kitchen.. 1 by mouth three times a day as needed 15)  L-arginine 1000 Mg Tabs (Arginine) .Marland Kitchen.. 1 by mouth two times a day 16)  Reveratrol  .Marland Kitchen.. 1 by mouth two times a day 17)  Vitamin D3 1000 Unit Caps (Cholecalciferol) .Marland Kitchen.. 1 by mouth once daily 18)  Celexa 40 Mg Tabs (Citalopram hydrobromide) .Marland Kitchen.. 1 by mouth at bedtime. 19)  Cialis 20 Mg Tabs (Tadalafil) .... As directed. 20)  Proair Hfa 108 (90 Base) Mcg/act Aers (Albuterol sulfate) .... 2 puffs qid as needed 21)  Claritin 10 Mg Tabs (Loratadine) .Marland Kitchen.. 1 by mouth once daily as needed 22)  Cyclobenzaprine Hcl 10 Mg Tabs  (Cyclobenzaprine hcl) .Marland Kitchen.. 1 by mouth once daily at bedtime as needed 23)  Ritalin 10 Mg Tabs (Methylphenidate hcl) .Marland Kitchen.. 1 by mouth two times a day 24)  Minocycline Hcl 100 Mg Caps (Minocycline hcl) .Marland Kitchen.. 1 by mouth two times a day 25)  Fenofibrate 160 Mg Tabs (Fenofibrate) .... Take  1 by mouth  once daily 26)  Amoxicillin 500 Mg Tabs (Amoxicillin) .... 2 by mouth two times a day  Patient Instructions: 1)  rest, fluids, tylenol 2)  amoxicillin x 10 days  3)  mucinex DM, continue flonase , claritin 4)  call if symptoms severe, severe headache, fever or no better in few days  Prescriptions: AMOXICILLIN  500 MG TABS (AMOXICILLIN) 2 by mouth two times a day  #40 x 0   Entered and Authorized by:   Nolon Rod. Vaneta Hammontree MD   Signed by:   Nolon Rod. Nela Bascom MD on 09/23/2010   Method used:   Electronically to        [facility]  [address] (retail)       [address]       [place], [place]  [address]       Ph: [phone]       Fax: [phone]   RxID:   [id number]    Orders Added: 1)  Est. Patient Level III [[address]]

## 2010-10-03 ENCOUNTER — Other Ambulatory Visit: Payer: Self-pay | Admitting: Family Medicine

## 2010-10-04 ENCOUNTER — Other Ambulatory Visit: Payer: Self-pay | Admitting: Family Medicine

## 2010-10-11 ENCOUNTER — Telehealth: Payer: Self-pay | Admitting: Family Medicine

## 2010-10-11 MED ORDER — METHYLPHENIDATE HCL 10 MG PO TABS: 10.0000 mg | ORAL_TABLET | Freq: Two times a day (BID) | ORAL | Status: DC

## 2010-10-11 NOTE — Telephone Encounter (Signed)
RX printed and left at check in for 10/13/10 pick up      KP

## 2010-10-11 NOTE — Telephone Encounter (Signed)
Refill ritalin - patient will pick up thurs - [phone]

## 2010-11-07 ENCOUNTER — Other Ambulatory Visit: Payer: Self-pay | Admitting: Family Medicine

## 2010-11-09 ENCOUNTER — Telehealth: Payer: Self-pay | Admitting: *Deleted

## 2010-11-09 NOTE — Telephone Encounter (Addendum)
Pt states that BS have been fluctuating. Pt c/o being weak, fatigue and being out of it sometimes after eating due to BS dropping. Pt note that BS fasting run around 120 to 130 and after meals 180 to 250. 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 Pt would like to know if med can be added to keep BS from dropping so low. Pt advise OV but states that he does not have insurance now and will not have it again until 2 months. Pt advise to make sure he is eating small snack in between meals and to make sure he has some juice available when BS drop low. Pt also inform to check BS at least twice a day and keep log so that we can get a better idea of BS. Pt is asymptomatic now. Please advise .

## 2010-11-10 NOTE — Telephone Encounter (Signed)
Left message to call back     KP 

## 2010-11-10 NOTE — Telephone Encounter (Signed)
He really should not be driving with those "spells"--- he probably needs ov to discuss and get labs---he can stop metformin but he needs ov

## 2010-11-10 NOTE — Telephone Encounter (Addendum)
Pt aware will cut med back to half a tab and monitor BS. Pt notes that the last time he stop med it made him feel real bad so he will try to decrease med first and if no reaction will stop med. Pt states that he is unable to come in now due to work schedule that will put him out of town until Monday.

## 2010-11-23 ENCOUNTER — Telehealth: Payer: Self-pay | Admitting: Family Medicine

## 2010-11-23 NOTE — Telephone Encounter (Signed)
Needs prescription for Ritalin---will pick up on Friday

## 2010-11-24 MED ORDER — METHYLPHENIDATE HCL 10 MG PO TABS: 10.0000 mg | ORAL_TABLET | Freq: Two times a day (BID) | ORAL | Status: DC

## 2010-11-24 NOTE — Telephone Encounter (Signed)
Rx printed and left at check in.    KP 

## 2010-11-30 ENCOUNTER — Other Ambulatory Visit: Payer: Self-pay | Admitting: Family Medicine

## 2010-11-30 MED ORDER — MELOXICAM 15 MG PO TABS: 15.0000 mg | ORAL_TABLET | Freq: Every day | ORAL | Status: DC

## 2010-11-30 NOTE — Telephone Encounter (Signed)
Last seen 09/23/10 and filled 10/24/10--- please advised      KP

## 2010-11-30 NOTE — Telephone Encounter (Signed)
Patient says he contacted [facility], [place] last week for his 30 day prescription for Meloxicam---they say they have not heard back from us---please call this in---patient says that he is out

## 2010-12-02 NOTE — Telephone Encounter (Signed)
Was done by Dr. Laury Axon 5/23

## 2010-12-06 ENCOUNTER — Other Ambulatory Visit: Payer: Self-pay | Admitting: Family Medicine

## 2010-12-06 MED ORDER — MELOXICAM 15 MG PO TABS: 15.0000 mg | ORAL_TABLET | Freq: Every day | ORAL | Status: DC

## 2010-12-06 NOTE — Telephone Encounter (Signed)
Pharmacy never rcv'd refill request---Rx resent # 30 with 2 refills and I also gave it to them verbally     KP

## 2010-12-23 ENCOUNTER — Other Ambulatory Visit: Payer: Self-pay | Admitting: Family Medicine

## 2011-01-04 ENCOUNTER — Other Ambulatory Visit: Payer: Self-pay | Admitting: Family Medicine

## 2011-01-04 MED ORDER — MELOXICAM 15 MG PO TABS: 15.0000 mg | ORAL_TABLET | Freq: Every day | ORAL | Status: DC

## 2011-01-04 NOTE — Telephone Encounter (Signed)
Ok to refill flexeril x1 

## 2011-01-04 NOTE — Telephone Encounter (Signed)
Last filled 12/06/10 with 2 refills[place] called to the pharmacy where he is...Marland KitchenMarland Kitchen Please advise    KP

## 2011-01-04 NOTE — Telephone Encounter (Signed)
Faxed.   KP 

## 2011-01-05 ENCOUNTER — Other Ambulatory Visit: Payer: Self-pay

## 2011-01-05 MED ORDER — CYCLOBENZAPRINE HCL 10 MG PO TABS: 10.0000 mg | ORAL_TABLET | Freq: Every day | ORAL | Status: DC

## 2011-01-06 ENCOUNTER — Other Ambulatory Visit: Payer: Self-pay | Admitting: Family Medicine

## 2011-01-06 DIAGNOSIS — I10 Essential (primary) hypertension: Secondary | ICD-10-CM

## 2011-01-06 DIAGNOSIS — R809 Proteinuria, unspecified: Secondary | ICD-10-CM

## 2011-01-06 DIAGNOSIS — E785 Hyperlipidemia, unspecified: Secondary | ICD-10-CM

## 2011-01-09 ENCOUNTER — Other Ambulatory Visit: Payer: Self-pay

## 2011-01-09 NOTE — Progress Notes (Signed)
Labs only

## 2011-01-09 NOTE — Progress Notes (Signed)
@  [id number](Error - No data available.)@ Labs only  

## 2011-01-10 ENCOUNTER — Other Ambulatory Visit: Payer: Self-pay | Admitting: Family Medicine

## 2011-01-10 MED ORDER — METHYLPHENIDATE HCL 10 MG PO TABS: 10.0000 mg | ORAL_TABLET | Freq: Two times a day (BID) | ORAL | Status: DC

## 2011-01-10 NOTE — Telephone Encounter (Signed)
Refill ritilan - patient will pick up Friday [id number]

## 2011-01-10 NOTE — Telephone Encounter (Signed)
OV due now     KP

## 2011-01-27 ENCOUNTER — Other Ambulatory Visit: Payer: Self-pay | Admitting: Family Medicine

## 2011-01-27 NOTE — Telephone Encounter (Signed)
Sent refill with notation that CPX is due.

## 2011-01-27 NOTE — Telephone Encounter (Signed)
Last seen 09/23/10 and filled 01/04/11 please advise    KP

## 2011-01-27 NOTE — Telephone Encounter (Signed)
Patient called to make sure that prescription had been called in--tried to make CPX appt ---says he will be getting insurance in Aug or sept---will call back to make appt when he gets insurance

## 2011-01-29 ENCOUNTER — Other Ambulatory Visit: Payer: Self-pay | Admitting: Family Medicine

## 2011-01-30 NOTE — Telephone Encounter (Signed)
Last seen 09/23/10 and filled 09/15/10 please advise    KP

## 2011-02-14 ENCOUNTER — Other Ambulatory Visit: Payer: Self-pay | Admitting: Family Medicine

## 2011-02-15 ENCOUNTER — Other Ambulatory Visit: Payer: Self-pay | Admitting: Family Medicine

## 2011-02-15 NOTE — Telephone Encounter (Signed)
Please advise on med instruction

## 2011-02-15 NOTE — Telephone Encounter (Signed)
Rx sent to pharmacy   

## 2011-02-15 NOTE — Telephone Encounter (Signed)
See med list----10 mg bid #60

## 2011-02-16 MED ORDER — METHYLPHENIDATE HCL 10 MG PO TABS: 10.0000 mg | ORAL_TABLET | Freq: Three times a day (TID) | ORAL | Status: DC

## 2011-02-16 NOTE — Telephone Encounter (Signed)
Per dr Laury Axon ok to change to TID, Pt aware Rx ready for pick up.

## 2011-02-21 ENCOUNTER — Other Ambulatory Visit: Payer: Self-pay | Admitting: Family Medicine

## 2011-02-23 NOTE — Telephone Encounter (Signed)
Last filled 11-07-10 #90 1, last ov 09-23-10

## 2011-02-24 NOTE — Telephone Encounter (Signed)
rx sent

## 2011-03-03 ENCOUNTER — Other Ambulatory Visit: Payer: Self-pay | Admitting: Family Medicine

## 2011-03-03 NOTE — Telephone Encounter (Signed)
Last seen 09/23/10 and filled 01/27/11 please advise     KP

## 2011-03-06 ENCOUNTER — Other Ambulatory Visit: Payer: Self-pay | Admitting: Family Medicine

## 2011-03-06 MED ORDER — METFORMIN HCL 500 MG PO TABS: 500.0000 mg | ORAL_TABLET | Freq: Two times a day (BID) | ORAL | Status: DC

## 2011-03-06 NOTE — Telephone Encounter (Signed)
Faxed.   KP 

## 2011-03-21 ENCOUNTER — Ambulatory Visit (INDEPENDENT_AMBULATORY_CARE_PROVIDER_SITE_OTHER): Payer: Managed Care, Other (non HMO) | Admitting: Family Medicine

## 2011-03-21 ENCOUNTER — Encounter: Payer: Self-pay | Admitting: Family Medicine

## 2011-03-21 VITALS — BP 146/92 | HR 75 | Temp 97.6°F | Wt 248.6 lb

## 2011-03-21 DIAGNOSIS — Z0000 Encounter for general adult medical examination without abnormal findings: Secondary | ICD-10-CM

## 2011-03-21 DIAGNOSIS — G4733 Obstructive sleep apnea (adult) (pediatric): Secondary | ICD-10-CM

## 2011-03-21 DIAGNOSIS — E785 Hyperlipidemia, unspecified: Secondary | ICD-10-CM

## 2011-03-21 DIAGNOSIS — G8929 Other chronic pain: Secondary | ICD-10-CM

## 2011-03-21 DIAGNOSIS — F988 Other specified behavioral and emotional disorders with onset usually occurring in childhood and adolescence: Secondary | ICD-10-CM

## 2011-03-21 DIAGNOSIS — C649 Malignant neoplasm of unspecified kidney, except renal pelvis: Secondary | ICD-10-CM

## 2011-03-21 DIAGNOSIS — I7789 Other specified disorders of arteries and arterioles: Secondary | ICD-10-CM

## 2011-03-21 DIAGNOSIS — M0520 Rheumatoid vasculitis with rheumatoid arthritis of unspecified site: Secondary | ICD-10-CM

## 2011-03-21 DIAGNOSIS — I2510 Atherosclerotic heart disease of native coronary artery without angina pectoris: Secondary | ICD-10-CM

## 2011-03-21 DIAGNOSIS — E119 Type 2 diabetes mellitus without complications: Secondary | ICD-10-CM

## 2011-03-21 DIAGNOSIS — R221 Localized swelling, mass and lump, neck: Secondary | ICD-10-CM

## 2011-03-21 DIAGNOSIS — M069 Rheumatoid arthritis, unspecified: Secondary | ICD-10-CM

## 2011-03-21 DIAGNOSIS — R220 Localized swelling, mass and lump, head: Secondary | ICD-10-CM

## 2011-03-21 DIAGNOSIS — R079 Chest pain, unspecified: Secondary | ICD-10-CM

## 2011-03-21 DIAGNOSIS — Z23 Encounter for immunization: Secondary | ICD-10-CM

## 2011-03-21 DIAGNOSIS — I10 Essential (primary) hypertension: Secondary | ICD-10-CM

## 2011-03-21 LAB — POCT URINALYSIS DIPSTICK
Blood, UA: NEGATIVE
Leukocytes, UA: NEGATIVE
Nitrite, UA: NEGATIVE
Urobilinogen, UA: 0.2
pH, UA: 6

## 2011-03-21 MED ORDER — TRAMADOL HCL 50 MG PO TABS: ORAL_TABLET | ORAL | Status: DC

## 2011-03-21 MED ORDER — METHYLPHENIDATE HCL 10 MG PO TABS: 10.0000 mg | ORAL_TABLET | Freq: Three times a day (TID) | ORAL | Status: DC

## 2011-03-21 MED ORDER — LISINOPRIL-HYDROCHLOROTHIAZIDE 20-25 MG PO TABS: ORAL_TABLET | ORAL | Status: DC

## 2011-03-21 NOTE — Patient Instructions (Signed)
Preventative Care for Adults, Male   MAINTAIN REGULAR HEALTH EXAMS   A routine yearly physical is a good way to check in with your primary care provider about your health and preventive screening. It is also an opportunity to share updates about your health and any concerns you have and receive a thorough all-over exam.   If you smoke or chew tobacco, find out from your caregiver how to quit. It can literally save your life, no matter how long you have been a tobacco user. If you do not use tobacco, never begin.   Maintain a healthy diet and normal weight. Increased weight leads to problems with blood pressure and diabetes. Decrease saturated fat in the diet and increase regular exercise. Get information about proper diet from your caregiver if necessary. Eat a variety of foods, including fruit, vegetables, animal or vegetable protein, such as meat, fish, chicken, and eggs, or beans, lentils, tofu, and grains, such as rice.   High blood pressure causes heart and blood vessel problems. Fat leaves deposits in your arteries that can block them. This causes heart disease and vessel disease elsewhere in your body. Check your blood pressure regularly and keep it within normal limits. Men over age 50 or those who have a family history of high blood pressure should have it checked at least every year.   Aerobic exercise helps maintain good heart health. 30 minutes of moderate-intensity exercise is recommended. For example, a brisk walk that increases your heart rate and breathing. This walk should be done on most days of the week. Persistent high blood pressure should be treated with medicine if weight loss and exercise do not work.   For many men aged 20 and older, having a cholesterol test of the blood every 5 years is recommended. If your cholesterol is found to be borderline high, or if you have heart disease or certain other medical conditions, then you may need to have it monitored more frequently.   Avoid smoking,  drinking alcohol in excess (more than two drinks per day) or use of street drugs. Do not share needles with anyone. Ask for professional help if you need assistance or instructions on stopping the use of alcohol, cigarettes, and/or drugs.   Maintain normal blood lipids and cholesterol by minimizing your intake of saturated fat. Eat a well rounded diet otherwise, with plenty of fruit and vegetables. The National Institutes of Health encourage men to eat 5-9 servings of fruit and vegetables each day. Your caregiver can help you keep your risk of heart disease or stroke at a lower level.   Ask your caregiver if you are in need of earlier testing because of: a strong family history of heart disease, or you have signs of elevated testosterone (male sex hormone) levels. This can predispose you to earlier heart disease. Ask if you should have a stress test if your history suggests this. A stress test is a test done on a treadmill that looks for heart disease. This test can find disease prior to there being a problem.   Diabetes screening assesses your blood sugar level after a fasting once every 3 years after age 45 if previous tests were normal.   Most routine colon cancer screening begins at the age of 50. On a yearly basis, doctors may provide special easy to use take-home tests to check for hidden blood in the stool. Sigmoidoscopy or colonoscopy can detect the earliest forms of colon cancer and is life saving. These test use a   small camera at the end of a tube to directly examine the colon. Speak to your caregiver about this at age 50, when routine screening begins (and is repeated every 5 years unless early forms of pre-cancerous polyps or small growths are found).   At the age of 50 men usually start screening for prostate cancer every year. Screening may begin at a younger age for those with higher risk. Those at higher risk include African-Americans or having a family history of prostate cancer. There are two types  of tests for prostate cancer:   Prostate-specific antigen (PSA) testing. Recent studies raise questions about prostate cancer using PSA and you should discuss this with your caregiver.   Digital rectal exam (in which your doctor’s lubricated and gloved finger feels for enlargement of the prostate through the anus).   Practice safe sex. Use condoms. Condoms are used for birth control and to help reduce the spread of sexually transmitted infections (or STIs). Unsafe sex is having an unprotected physical relationship with someone who is bisexual, homosexual, uses intravenous street drugs, or going with someone who has sexual relations with high-risk groups. Practicing safe sex helps you avoid getting an STI. Some of the STIs are gonorrhea (the clap), chlamydia, syphilis, trichimonas, herpes, HPV (human papiloma virus) and HIV (human immunodeficiency virus) which causes AIDS. The herpes, HIV and HPV are viral illnesses that have no cure. These can result in disability, cancer and death.   It is not safe for someone who has AIDS or is HIV positive to have unprotected sex with someone else who is positive. The reason for this is the fact that there are many different strains of HIV. If you have a strain that is readily treated with medications and then suddenly introduce a strain from a partner that has no further treatment options, you may suddenly have a strain of HIV that is untreatable. Even if you are both positive for HIV, it is still necessary to practice safe sex.   Use sunscreen with a SPF (or skin protection factor) of 15 or greater. Apply sunscreen liberally and repeatedly throughout the day. Being outside in the sun when your shadow caused by the sun is shorter than you are, means you are being exposed to sun at greater intensity. Lighter skinned people are at a greater risk of skin cancer. Don’t forget to also wear sunglasses in order to protect your eyes from too much damaging sunlight. Damaging sunlight can  accelerate cataract formation.   Once a month do a whole body skin exam or review, using a mirror to look at your back. Notify your caregivers of changes in moles, especially if there are changes in shapes, colors, a size larger than a pencil eraser, an irregular border, or development of new moles.   Keep carbon monoxide and smoke detectors in your home functioning at all times. Change the batteries every 6 months or use a model that plugs into the wall.   Do a monthly exam of your testicles. Gently roll each testicle between your thumb and fingers, feeling for any abnormal lumps. The best time to do this is after a hot shower or bath when the tissues are looser. Notify your caregivers of any lumps, tenderness or changes in size or shape immediately.   Stay up to date with your tetanus shots and other required immunizations. You should have a booster for tetanus every 10 years. Be sure to get your flu shot every year, since 5%-20% of the [place] population   comes down with the flu. The flu vaccine changes each year, so being vaccinated once is not enough. Get your shot in the fall, before the flu season peaks. The table below lists important vaccines to get. Other vaccines to consider include:   Hepatitis A virus (to prevent a form of infection of the liver by a virus acquired from food), Varicella Zoster (a virus that causes shingles).   Meningococcal (against bacteria which cause a form of meningitis).   Brush your teeth twice a day with fluoride toothpaste, and floss once a day. Good oral hygiene prevents tooth decay and gum disease. The problems can be painful, unattractive, and can cause other health problems. Visit your dentist for a routine oral and dental check up and preventive care every 6-12 months.   The Body Mass Index or BMI is a way of measuring how much of your body is fat. Having a BMI above 27 increases the risk of heart disease, diabetes, hypertension, stroke and other problems related to obesity.  Your caregiver can help determine your BMI and based on it develop an exercise and dietary program to help you achieve or maintain this important measurement at a healthful level.   Wear seat belts whenever in a vehicle, whether a passenger or driver, and even for very short drives of a few minutes.   If you bicycle, wear a helmet at all times.   Below is a summary of the most important preventative healthcare services that adult males should seek on a regular basis throughout their lives:   Preventative Care for Adult Males    Preventative Service  Ages 19-39  Ages 40-64  Ages 65 and over    Schedule of medical visits  Every 5 years  Every 5 years     Schedule of dental visits  Every 6-12 months  Every 6-12 months     Health risk assessment and lifestyle counseling  Every 3-5 years  Every 3-5 years  Every 3-5 years    Blood pressure check**  Every 2 years  Every 2 years  Every 2 years    Total cholesterol check including HDL**  Every 5 years beginning at age 35  Every 5 years  Every 5 years through age 75, then optional.    Flexible sigmoidoscopy or colonoscopy**   Every 5 years beginning at age 50  Every 5 years through age 80, then optional.    Prostate screening   Every year beginning at age 50  Every Year    Testicular exam  Monthly  Monthly  Monthly    FOBT (fecal occult blood test)   Every year beginning at age 50  Every year until 80, then optional.    Skin self-exam  Monthly  Monthly  Monthly    Tetanus-diphtheria (Td) immunization  Every 10 years  Every 10 years  Every 10 years    Influenza immunization**  Every year  Every year  Every year    Pneumococcal immunization**  Optional  Optional  Every 5 years    Hepatitis B immunization**  Series of 3 immunizations   (if not done previously, usually given at 0, 1 to 2, and 4 to 6 months)  Check with your caregiver if vaccination not previously given.  Check with your caregiver if vaccination not previously given.    **Family history and personal history of  risk and conditions may change your physician's recommendations.    Document Released: 08/22/2001 Document Re-Released: 09/20/2009   Exit[employer]® Patient Information ©  2011 [employer].

## 2011-03-21 NOTE — Assessment & Plan Note (Signed)
Pt needs f/u with urology

## 2011-03-21 NOTE — Assessment & Plan Note (Signed)
Refill meds stable 

## 2011-03-21 NOTE — Assessment & Plan Note (Signed)
Pt was seeing Dr Juliann Pares in [place]-- he would like to switch to [facility] because he prefers to be in the [facility] Pt aware he will need to have records sent

## 2011-03-21 NOTE — Assessment & Plan Note (Signed)
Check labs Cont' meds 

## 2011-03-21 NOTE — Assessment & Plan Note (Signed)
Elevated today meds adjusted rto 2-3 weeks

## 2011-03-21 NOTE — Progress Notes (Signed)
  Subjective:    Patient ID: Jesse Vargas, male    DOB: September 21, 1955, 55 y.o.   MRN: [id number]  HPI Pt here for cpe and labs.  Pt would like to switch to [facility] because it is more convenient.  He also needs f/u from renal CA.     Review of Systems  Review of Systems  Constitutional: Negative for activity change, appetite change and fatigue.  HENT: Negative for hearing loss, congestion, tinnitus and ear discharge.  Dentist-- no   Eyes: Negative for visual disturbance (see optho q1y -- vision corrected to 20/20 with glasses).  Respiratory: Negative for cough, chest tightness and shortness of breath.   Cardiovascular: Negative for chest pain, palpitations and leg swelling.  Gastrointestinal: Negative for abdominal pain, diarrhea, constipation and abdominal distention.  Genitourinary: Negative for urgency, frequency, decreased urine volume and difficulty urinating.  Musculoskeletal: Negative for back pain, arthralgias and gait problem.  Skin: Negative for color change, pallor and rash.  Neurological: Negative for dizziness, light-headedness, numbness and headaches.  Hematological: Negative for adenopathy. Does not bruise/bleed easily.  Psychiatric/Behavioral: Negative for suicidal ideas, confusion, sleep disturbance, self-injury, dysphoric mood, decreased concentration and agitation.  Pt is able to read and write and can do all ADLs No risk for falling No abuse/ violence in home      Objective:   Physical Exam  BP 146/92  Pulse 75  Temp(Src) 97.6 F (36.4 C) (Oral)  Wt 248 lb 9.6 oz (112.764 kg)  SpO2 96%  General Appearance:    Alert, cooperative, no distress, appears stated age  Head:    Normocephalic, without obvious abnormality, atraumatic  Eyes:    PERRL, conjunctiva/corneas clear, EOM's intact, fundi    benign, both eyes       Ears:    Normal TM's and external ear canals, both ears  Nose:   Nares normal, septum midline, mucosa normal, no drainage   or sinus  tenderness  Throat:   Lips, mucosa, and tongue normal; teeth and gums normal  Neck:   Supple, symmetrical, trachea midline, no adenopathy;       thyroid:  No enlargement/tenderness/nodules; no carotid   bruit or JVD  Back:     Symmetric, no curvature, ROM normal, no CVA tenderness  Lungs:     Clear to auscultation bilaterally, respirations unlabored  Chest wall:    No tenderness or deformity  Heart:    Regular rate and rhythm, S1 and S2 normal, no murmur, rub   or gallop  Abdomen:     Soft, non-tender, bowel sounds active all four quadrants,    no masses, no organomegaly  , + ventral hernia and tenderness L flank--over scar  Genitalia:    Normal male without lesion, discharge or tenderness  Rectal:    Normal tone, normal prostate, no masses or tenderness;   guaiac negative stool  Extremities:   Extremities normal, atraumatic, no cyanosis or edema  Pulses:   2+ and symmetric all extremities  Skin:   Skin color, texture, turgor normal, no rashes or lesions  Lymph nodes:   Cervical, supraclavicular, and axillary nodes normal  Neurologic:   CNII-XII intact. Normal strength, sensation and reflexes      throughout            A/p : 1  Preventative --check labs-ghm utd  see avs

## 2011-03-21 NOTE — Assessment & Plan Note (Signed)
On cpap Per dr in [facility]

## 2011-03-21 NOTE — Assessment & Plan Note (Signed)
Check labs today Check glucose 1-2 x a day con't meds

## 2011-03-22 ENCOUNTER — Encounter: Payer: Self-pay | Admitting: Gastroenterology

## 2011-03-22 LAB — CBC WITH DIFFERENTIAL/PLATELET
Basophils Relative: 0.4 % (ref 0.0–3.0)
Eosinophils Relative: 4 % (ref 0.0–5.0)
Hemoglobin: 13.5 g/dL (ref 13.0–17.0)
Lymphocytes Relative: 60.8 % — ABNORMAL HIGH (ref 12.0–46.0)
Monocytes Relative: 10.9 % (ref 3.0–12.0)
Neutrophils Relative %: 23.9 % — ABNORMAL LOW (ref 43.0–77.0)
RBC: 4.32 Mil/uL (ref 4.22–5.81)
WBC: 5.5 10*3/uL (ref 4.5–10.5)

## 2011-03-22 LAB — BASIC METABOLIC PANEL
Calcium: 9.5 mg/dL (ref 8.4–10.5)
Creatinine, Ser: 0.9 mg/dL (ref 0.4–1.5)
Sodium: 139 mEq/L (ref 135–145)

## 2011-03-22 LAB — HEPATIC FUNCTION PANEL
ALT: 26 U/L (ref 0–53)
AST: 33 U/L (ref 0–37)
Alkaline Phosphatase: 67 U/L (ref 39–117)
Bilirubin, Direct: 0.1 mg/dL (ref 0.0–0.3)
Total Protein: 7.1 g/dL (ref 6.0–8.3)

## 2011-03-22 LAB — LIPID PANEL
Cholesterol: 197 mg/dL (ref 0–200)
Total CHOL/HDL Ratio: 6
Triglycerides: 718 mg/dL — ABNORMAL HIGH (ref 0.0–149.0)

## 2011-03-22 LAB — SEDIMENTATION RATE: Sed Rate: 10 mm/hr (ref 0–22)

## 2011-03-22 LAB — HEMOGLOBIN A1C: Hgb A1c MFr Bld: 7.4 % — ABNORMAL HIGH (ref 4.6–6.5)

## 2011-03-22 LAB — ANA: Anti Nuclear Antibody(ANA): NEGATIVE

## 2011-03-23 LAB — MICROALBUMIN / CREATININE URINE RATIO
Microalb Creat Ratio: 6.9 mg/g (ref 0.0–30.0)
Microalb, Ur: 12.4 mg/dL — ABNORMAL HIGH (ref 0.0–1.9)

## 2011-03-24 ENCOUNTER — Other Ambulatory Visit (HOSPITAL_COMMUNITY): Payer: Managed Care, Other (non HMO)

## 2011-03-27 ENCOUNTER — Telehealth: Payer: Self-pay

## 2011-03-27 DIAGNOSIS — E785 Hyperlipidemia, unspecified: Secondary | ICD-10-CM

## 2011-03-27 MED ORDER — METFORMIN HCL 1000 MG PO TABS: 1000.0000 mg | ORAL_TABLET | Freq: Two times a day (BID) | ORAL | Status: DC

## 2011-03-27 NOTE — Telephone Encounter (Signed)
Discussed with patient Rx sent to the pharmacy Ref put in and copy mailed to patient. He will call back to schedule repeat labs    KP

## 2011-03-27 NOTE — Telephone Encounter (Signed)
Message copied by Arnette Norris on Mon Mar 27, 2011  9:01 AM ------      Message from: Lelon Perla      Created: Sun Mar 26, 2011  8:22 PM       DM-- not controlled----  Increase glucophage to 1000 mg bid      Cholesterol--- LDL goal < 70,  HDL >40,  TG < 150.  Diet and exercise will increase HDL and decrease LDL and TG.  Fish,  Fish Oil, Flaxseed oil will also help increase the HDL and decrease Triglycerides.   Recheck labs in 3 months.------ TG are very high--con't fenofibrate and pravachol-- refer to lipid clinic      Cbc abnormal---- repeat in 2 weeks ----288.8  Cbcd,

## 2011-03-29 ENCOUNTER — Telehealth: Payer: Self-pay

## 2011-03-29 ENCOUNTER — Other Ambulatory Visit (HOSPITAL_COMMUNITY): Payer: Managed Care, Other (non HMO)

## 2011-03-29 NOTE — Telephone Encounter (Signed)
Jesse Vargas stated that patient patient has and Kidney cancer in 1999 and she wanted to get him an US of the Abdomen because she is concerned that his cancer may have come back since his platelet count is low. Last CT of the abdomen was 09/2009----Please advise     KP

## 2011-03-29 NOTE — Telephone Encounter (Signed)
Pt needs to see urologist again

## 2011-03-29 NOTE — Telephone Encounter (Signed)
Stated he would talk to the [employer] when he goes to see them...   KP

## 2011-03-30 ENCOUNTER — Ambulatory Visit (INDEPENDENT_AMBULATORY_CARE_PROVIDER_SITE_OTHER): Payer: Managed Care, Other (non HMO)

## 2011-03-30 VITALS — BP 154/90

## 2011-03-30 DIAGNOSIS — E785 Hyperlipidemia, unspecified: Secondary | ICD-10-CM

## 2011-03-30 NOTE — Progress Notes (Signed)
HPI Jesse Vargas presented today for an initial lipid visit with his wife.  He is currently employed as a Midwife.  Current cholesterol treatment includes fenofibrate 160mg  and pravastatin 40mg .  His past medical history is significant for Diabetes and renal cancer.   Diet - Jesse Vargas has a poorly controlled diet.  He recognizes the need to make significant lifestyle change, but appears apathetic to  make those changes.  For breakfast, he usually has 2-3 packs of oatmeal with brown sugar and butter and 2-3 boiled eggs.  Lunch is   usually fast-food hamburgers, hotdogs, BBQ or [employer].  He does not typically snack throughout the day.  Dinner is usually  some type of red meat and potatoes or cornbread.  He indicated that his servings (especially dinner) are heapingfuls of food.  He   occasionally has dessert, and he drinks tea with splenda.   Exercise - He does not do any aerobic physical activity.  He bought a bicycle, but has not used it.  Filed Vitals:   03/30/11 1635  BP: 154/90   Lipid Panel     Component Value Date/Time   CHOL 197 03/21/2011 1521   TRIG 718.0* 03/21/2011 1521   HDL 33.90* 03/21/2011 1521   CHOLHDL 6 03/21/2011 1521   VLDL 143.6* 03/21/2011 1521   LDLCALC See Comment mg/dL 11/14/8467 6295       LDL Direct  64      03/21/2011      A1c   7.4%      03/21/2011    6.6%      09/16/2010  Current Outpatient Prescriptions  Medication Sig Dispense Refill  . aspirin 81 MG tablet Take 81 mg by mouth daily.        . citalopram (CELEXA) 40 MG tablet TAKE ONE TABLET BY MOUTH EVERY DAY AT BEDTIME  30 tablet  2  . cyclobenzaprine (FLEXERIL) 10 MG tablet TAKE ONE TABLET BY MOUTH EVERY DAY AT BEDTIME AS NEEDED  30 tablet  0  . fenofibrate 160 MG tablet TAKE ONE TABLET BY MOUTH EVERY DAY  30 tablet  0  . gabapentin (NEURONTIN) 300 MG capsule TAKE ONE CAPSULE BY MOUTH THREE TIMES DAILY AS NEEDED  90 capsule  5  . lisinopril-hydrochlorothiazide (PRINZIDE,ZESTORETIC) 20-25 MG per  tablet 1 po qd  30 tablet  5  . loratadine (CLARITIN) 10 MG tablet Take 10 mg by mouth daily.        . meloxicam (MOBIC) 15 MG tablet Take 1 tablet (15 mg total) by mouth daily.  30 tablet  0  . metFORMIN (GLUCOPHAGE) 1000 MG tablet Take 1 tablet (1,000 mg total) by mouth 2 (two) times daily with a meal.  60 tablet  2  . methylphenidate (RITALIN) 10 MG tablet Take 1 tablet (10 mg total) by mouth 3 (three) times daily.  90 tablet  0  . pravastatin (PRAVACHOL) 40 MG tablet Take 1 tablet (40 mg total) by mouth at bedtime.  30 tablet  2  . traMADol (ULTRAM) 50 MG tablet 1 po q6h prn  90 tablet  1

## 2011-03-30 NOTE — Assessment & Plan Note (Signed)
CV Risk Assessment Risk Factors: low HDL, HTN, DM, sedentary lifestyle, poor diet LDL Goal<100 (optimal<70), HDL Goal>40, TG Goal<150  Recommendations & Plan 1. Cut back on the amount of red meat you eat.  Have steak only 1x/week 2. Cut back on the number of times you eat hamburgers and hotdogs for lunch.  Limit fast food to 3x/week 3. Avoid going back for second servings at dinner 4. Start biking 3x/week 5. Start taking omega-3 fish oil (especially DHA+EPA) 4g daily 6. Return for f/u labs & visit in 2-3 months

## 2011-03-30 NOTE — Patient Instructions (Signed)
Goals: 1. Cut back on the amount of red meat that you eat.  Try to have steak 1x per week. 2. Cut back on the number of times you have hamburgers and hotdogs 3. Avoid going back for seconds at dinner. 4. Start biking 3x per week 5. Start taking omega-3 fish oil (DHA+EPA) 4grams daily 6. Return for follow-up labs & visit in 2-3 months

## 2011-04-05 ENCOUNTER — Other Ambulatory Visit: Payer: Self-pay | Admitting: Family Medicine

## 2011-04-06 ENCOUNTER — Other Ambulatory Visit: Payer: Self-pay | Admitting: Family Medicine

## 2011-04-06 DIAGNOSIS — D7289 Other specified disorders of white blood cells: Secondary | ICD-10-CM

## 2011-04-07 ENCOUNTER — Other Ambulatory Visit: Payer: Self-pay | Admitting: *Deleted

## 2011-04-07 ENCOUNTER — Other Ambulatory Visit: Payer: Managed Care, Other (non HMO)

## 2011-04-07 MED ORDER — FREESTYLE LANCETS MISC: Status: DC

## 2011-04-07 MED ORDER — GLUCOSE BLOOD VI STRP: ORAL_STRIP | Status: DC

## 2011-04-07 NOTE — Telephone Encounter (Signed)
Rx sent to pharmacy   

## 2011-04-07 NOTE — Progress Notes (Signed)
Labs only

## 2011-04-11 ENCOUNTER — Other Ambulatory Visit: Payer: Self-pay | Admitting: Family Medicine

## 2011-04-13 ENCOUNTER — Encounter: Payer: Self-pay | Admitting: Internal Medicine

## 2011-04-14 ENCOUNTER — Telehealth: Payer: Self-pay | Admitting: *Deleted

## 2011-04-14 ENCOUNTER — Encounter: Payer: Self-pay | Admitting: Gastroenterology

## 2011-04-14 ENCOUNTER — Ambulatory Visit (AMBULATORY_SURGERY_CENTER): Payer: Managed Care, Other (non HMO) | Admitting: *Deleted

## 2011-04-14 ENCOUNTER — Ambulatory Visit
Admission: RE | Admit: 2011-04-14 | Discharge: 2011-04-14 | Disposition: A | Discharge status: Home / Self Care | Payer: Managed Care, Other (non HMO) | Source: Ambulatory Visit | Attending: Family Medicine | Admitting: Family Medicine

## 2011-04-14 ENCOUNTER — Ambulatory Visit (INDEPENDENT_AMBULATORY_CARE_PROVIDER_SITE_OTHER): Payer: Managed Care, Other (non HMO) | Admitting: Internal Medicine

## 2011-04-14 ENCOUNTER — Encounter: Payer: Self-pay | Admitting: Internal Medicine

## 2011-04-14 VITALS — Ht 70.0 in | Wt 248.5 lb

## 2011-04-14 DIAGNOSIS — R0600 Dyspnea, unspecified: Secondary | ICD-10-CM | POA: Insufficient documentation

## 2011-04-14 DIAGNOSIS — R0602 Shortness of breath: Secondary | ICD-10-CM | POA: Insufficient documentation

## 2011-04-14 DIAGNOSIS — Z1211 Encounter for screening for malignant neoplasm of colon: Secondary | ICD-10-CM

## 2011-04-14 DIAGNOSIS — R079 Chest pain, unspecified: Secondary | ICD-10-CM

## 2011-04-14 DIAGNOSIS — E785 Hyperlipidemia, unspecified: Secondary | ICD-10-CM

## 2011-04-14 DIAGNOSIS — R221 Localized swelling, mass and lump, neck: Secondary | ICD-10-CM

## 2011-04-14 DIAGNOSIS — I2510 Atherosclerotic heart disease of native coronary artery without angina pectoris: Secondary | ICD-10-CM

## 2011-04-14 MED ORDER — PEG-KCL-NACL-NASULF-NA ASC-C 100 G PO SOLR: ORAL | Status: DC

## 2011-04-14 NOTE — Assessment & Plan Note (Signed)
Patient says he stays congested.  USes afrin daily.  Counselled on that   Has nasal steroid  I encouraged him to use it. In regards to his SOB  Will get myoivew  And also schedule echo to evaluate diastolic function.

## 2011-04-14 NOTE — Patient Instructions (Signed)
Your physician recommends that you schedule a follow-up appointment in: 6 months, the office will mail you a reminder letter 2 months prior appointment time. Your physician has requested that you have en exercise stress myoview. For further information please visit [email or web address] Please follow instruction sheet, as given. Your physician has requested that you have an echocardiogram. Echocardiography is a painless test that uses sound waves to create images of your heart. It provides your doctor with information about the size and shape of your heart and how well your heart's chambers and valves are working. This procedure takes approximately one hour. There are no restrictions for this procedure.

## 2011-04-14 NOTE — Assessment & Plan Note (Signed)
By report mild CAD on cath in 2007.  CP is atypical   I am not convinced cardiac.  But I would recomm stress myoview to confirm.

## 2011-04-14 NOTE — Assessment & Plan Note (Signed)
Patient seen in lipid clinc.  Counselled again on diet restrictions.

## 2011-04-14 NOTE — Progress Notes (Signed)
Pt had previous colonoscopy 6 years ago with Dr. Robert Elliott in [place], [place]; [facility].  Release of information form signed and given to Amanda Lewellyn, CMA.  Colonoscopy scheduled for Wednesday 04/26/2011. Jesse Vargas   

## 2011-04-14 NOTE — Assessment & Plan Note (Signed)
BP is not optimal.  WIll not change meds for now.  Wati no tests.

## 2011-04-14 NOTE — Progress Notes (Signed)
HPI Patient was previously followed by Dr. Juliann Pares.  Mild CAD by cath in 2007. Followed for HTN, dyslipidemia History of DM  Usually 110s (70 to 200/300)  Labile.  Doesn't check often BP 140s to 150s/ 87-92.  Using CPAP  Tolerates OK.   Always feels a little SOB>  Going on for years.  WOrse lately. Has had chest pain, SOB.  L arm hurts.  More for 3 months. Chest pain comes/goes.  Pain made worse by hard work.  Strenuous labor.   "Can't get enough air." Hot all the time.  Allergies  Allergen Reactions  . Hydrocodone-Acetaminophen Hives  . Sulfonamide Derivatives Hives    Current Outpatient Prescriptions  Medication Sig Dispense Refill  . aspirin 81 MG tablet Take 81 mg by mouth daily.        . citalopram (CELEXA) 40 MG tablet TAKE ONE TABLET BY MOUTH EVERY DAY AT BEDTIME  30 tablet  2  . cyclobenzaprine (FLEXERIL) 10 MG tablet TAKE ONE TABLET BY MOUTH EVERY DAY AT BEDTIME AS NEEDED  30 tablet  0  . fenofibrate 160 MG tablet TAKE ONE TABLET BY MOUTH EVERY DAY  30 tablet  2  . gabapentin (NEURONTIN) 300 MG capsule TAKE ONE CAPSULE BY MOUTH THREE TIMES DAILY AS NEEDED  90 capsule  5  . glucose blood test strip Use as instructed  100 each  12  . Lancets (FREESTYLE) lancets Use as instructed  100 each  12  . lisinopril-hydrochlorothiazide (PRINZIDE,ZESTORETIC) 20-25 MG per tablet 1 po qd  30 tablet  5  . loratadine (CLARITIN) 10 MG tablet TAKE ONE TABLET BY MOUTH EVERY DAY AS NEEDED  30 tablet  2  . meloxicam (MOBIC) 15 MG tablet Take 1 tablet (15 mg total) by mouth daily.  30 tablet  0  . metFORMIN (GLUCOPHAGE) 1000 MG tablet Take 1 tablet (1,000 mg total) by mouth 2 (two) times daily with a meal.  60 tablet  2  . methylphenidate (RITALIN) 10 MG tablet Take 1 tablet (10 mg total) by mouth 3 (three) times daily.  90 tablet  0  . metoprolol (LOPRESSOR) 50 MG tablet Take 50 mg by mouth 2 (two) times daily.        . pravastatin (PRAVACHOL) 40 MG tablet Take 1 tablet (40 mg total) by mouth at  bedtime.  30 tablet  2  . traMADol (ULTRAM) 50 MG tablet 1 po q6h prn  90 tablet  1    Past Medical History  Diagnosis Date  . Diabetes mellitus   . Hypertension   . Chronic kidney disease     Kidney cancer 1999  . Hyperlipidemia   . Sleep apnea   . Heart disease     Past Surgical History  Procedure Date  . Uvulopalatopharyngoplasty     08/1988  . Tonsillectomy     08/1988  . Cholecystectomy     2000  . Nephrectomy     L---1999 Cancer  . Nasal sinus surgery     2005  . Elbow surgery     left  . Inner ear surgery     R ear  . Ingrown toenail     Family History  Problem Relation Age of Onset  . Diabetes Father   . Hypertension Father   . Hyperlipidemia Father   . Hypertension Mother     History   Social History  . Marital Status: Married    Spouse Name: N/A    Number of Children: N/A  .  Years of Education: N/A   Occupational History  . [employer]    Social History Main Topics  . Smoking status: Former Smoker -- 2.0 packs/day for 25 years    Types: Cigarettes    Quit date: 03/20/1998  . Smokeless tobacco: Never Used  . Alcohol Use: Yes     rare  . Drug Use: No  . Sexually Active: Yes -- Male partner(s)   Other Topics Concern  . Not on file   Social History Narrative  . No narrative on file    Review of Systems:  All systems reviewed.  They are negative to the above problem except as previously stated.  Vital Signs: BP 138/88  Pulse 78  Ht 5\' 10"  (1.778 m)  Wt 234 lb 6.4 oz (106.323 kg)  BMI 33.63 kg/m2  Physical Exam  Pti is in NAD.  HEENT:  Normocephalic, atraumatic. EOMI, PERRLA.  Neck: JVP is normal. No thyromegaly. No bruits.  Lungs: clear to auscultation. No rales no wheezes.  Heart: Regular rate and rhythm. Normal S1, S2. No S3.   No significant murmurs. PMI not displaced.  Abdomen:  Supple, nontender. Normal bowel sounds. No masses. No hepatomegaly.  Extremities:   Good distal pulses throughout. No lower extremity  edema.  Musculoskeletal :moving all extremities.  Neuro:   alert and oriented x3.  CN II-XII grossly intact.  EKG:  SR.  70 bpm   Assessment and Plan:

## 2011-04-14 NOTE — Telephone Encounter (Signed)
Pt had previous colonoscopy 6 years ago with Dr. Lynnae Prude in [place], [place]; [facility].  Release of information form signed and given to Christie Nottingham, CMA.  Colonoscopy scheduled for Wednesday 04/26/2011. Ezra Sites

## 2011-04-14 NOTE — Assessment & Plan Note (Signed)
Uses CPAP 

## 2011-04-17 ENCOUNTER — Other Ambulatory Visit: Payer: Self-pay | Admitting: Family Medicine

## 2011-04-17 ENCOUNTER — Other Ambulatory Visit (INDEPENDENT_AMBULATORY_CARE_PROVIDER_SITE_OTHER): Payer: Managed Care, Other (non HMO)

## 2011-04-17 DIAGNOSIS — D7289 Other specified disorders of white blood cells: Secondary | ICD-10-CM

## 2011-04-17 LAB — CBC WITH DIFFERENTIAL/PLATELET
Basophils Absolute: 0 K/uL (ref 0.0–0.1)
Basophils Relative: 0.4 % (ref 0.0–3.0)
Eosinophils Absolute: 0.1 K/uL (ref 0.0–0.7)
Eosinophils Relative: 1.8 % (ref 0.0–5.0)
HCT: 41.7 % (ref 39.0–52.0)
Hemoglobin: 14 g/dL (ref 13.0–17.0)
Lymphocytes Relative: 37.4 % (ref 12.0–46.0)
Lymphs Abs: 2.7 K/uL (ref 0.7–4.0)
MCHC: 33.7 g/dL (ref 30.0–36.0)
MCV: 92.3 fl (ref 78.0–100.0)
Monocytes Absolute: 0.4 K/uL (ref 0.1–1.0)
Monocytes Relative: 4.8 % (ref 3.0–12.0)
Neutro Abs: 4.1 K/uL (ref 1.4–7.7)
Neutrophils Relative %: 55.6 % (ref 43.0–77.0)
Platelets: 200 K/uL (ref 150.0–400.0)
RBC: 4.51 Mil/uL (ref 4.22–5.81)
RDW: 13.1 % (ref 11.5–14.6)
WBC: 7.3 K/uL (ref 4.5–10.5)

## 2011-04-17 MED ORDER — GLUCOSE BLOOD VI STRP: ORAL_STRIP | Status: DC

## 2011-04-17 NOTE — Progress Notes (Signed)
Labs only

## 2011-04-24 ENCOUNTER — Telehealth: Payer: Self-pay | Admitting: Gastroenterology

## 2011-04-24 NOTE — Telephone Encounter (Signed)
Received copies from Dr. Jalene Mullet 04/24/2011. Forwarded  16pages to Dr. Darlina Rumpf review.

## 2011-04-25 ENCOUNTER — Ambulatory Visit (INDEPENDENT_AMBULATORY_CARE_PROVIDER_SITE_OTHER): Payer: Managed Care, Other (non HMO) | Admitting: Urology

## 2011-04-25 DIAGNOSIS — C649 Malignant neoplasm of unspecified kidney, except renal pelvis: Secondary | ICD-10-CM

## 2011-04-26 ENCOUNTER — Encounter: Payer: Self-pay | Admitting: *Deleted

## 2011-04-26 ENCOUNTER — Ambulatory Visit (AMBULATORY_SURGERY_CENTER): Payer: Managed Care, Other (non HMO) | Admitting: Gastroenterology

## 2011-04-26 ENCOUNTER — Encounter: Payer: Self-pay | Admitting: Gastroenterology

## 2011-04-26 DIAGNOSIS — Z83719 Family history of colon polyps, unspecified: Secondary | ICD-10-CM

## 2011-04-26 DIAGNOSIS — Z1211 Encounter for screening for malignant neoplasm of colon: Secondary | ICD-10-CM

## 2011-04-26 DIAGNOSIS — Z8371 Family history of colonic polyps: Secondary | ICD-10-CM

## 2011-04-26 LAB — GLUCOSE, CAPILLARY
Glucose-Capillary: 119 mg/dL — ABNORMAL HIGH (ref 70–99)
Glucose-Capillary: 125 mg/dL — ABNORMAL HIGH (ref 70–99)

## 2011-04-26 MED ORDER — SODIUM CHLORIDE 0.9 % IV SOLN: 500.0000 mL | INTRAVENOUS | Status: DC

## 2011-04-26 NOTE — Patient Instructions (Signed)
Please refer to your blue and neon green sheets for instructions regarding diet and activity for the rest of today.  You may resume your medications as you would normally take them.  

## 2011-04-27 ENCOUNTER — Telehealth: Payer: Self-pay | Admitting: *Deleted

## 2011-04-27 NOTE — Telephone Encounter (Signed)

## 2011-05-01 ENCOUNTER — Ambulatory Visit (HOSPITAL_BASED_OUTPATIENT_CLINIC_OR_DEPARTMENT_OTHER): Payer: Managed Care, Other (non HMO) | Admitting: Radiology

## 2011-05-01 ENCOUNTER — Ambulatory Visit (HOSPITAL_COMMUNITY): Payer: Managed Care, Other (non HMO) | Attending: Internal Medicine | Admitting: Radiology

## 2011-05-01 DIAGNOSIS — R0602 Shortness of breath: Secondary | ICD-10-CM

## 2011-05-01 DIAGNOSIS — R0789 Other chest pain: Secondary | ICD-10-CM

## 2011-05-01 DIAGNOSIS — R072 Precordial pain: Secondary | ICD-10-CM

## 2011-05-01 DIAGNOSIS — R0989 Other specified symptoms and signs involving the circulatory and respiratory systems: Secondary | ICD-10-CM

## 2011-05-01 DIAGNOSIS — R0609 Other forms of dyspnea: Secondary | ICD-10-CM

## 2011-05-01 DIAGNOSIS — R079 Chest pain, unspecified: Secondary | ICD-10-CM | POA: Insufficient documentation

## 2011-05-01 MED ORDER — TECHNETIUM TC 99M TETROFOSMIN IV KIT
11.0000 | PACK | Freq: Once | INTRAVENOUS | Status: AC | PRN
  Administered 2011-05-01: 11 via INTRAVENOUS

## 2011-05-01 MED ORDER — REGADENOSON 0.4 MG/5ML IV SOLN
0.4000 mg | Freq: Once | INTRAVENOUS | Status: AC
  Administered 2011-05-01: 0.4 mg via INTRAVENOUS

## 2011-05-01 MED ORDER — TECHNETIUM TC 99M TETROFOSMIN IV KIT
33.0000 | PACK | Freq: Once | INTRAVENOUS | Status: AC | PRN
  Administered 2011-05-01: 33 via INTRAVENOUS

## 2011-05-01 NOTE — Progress Notes (Signed)
[facility] SITE 3 NUCLEAR MED [address] [place] [address] [phone]  Cardiology Nuclear Med Study  Jesse Vargas is a 55 y.o. male [id number] June 22, 1956   Nuclear Med Background Indication for Stress Test:  Evaluation for Ischemia History: 07 Heart Catheterization:mild CAD and 05 Myocardial Perfusion Study:normal per patient, done at [facility] Cardiac Risk Factors: History of Smoking, Hypertension, Lipids and NIDDM  Symptoms:  Chest Pain & Tightness with exertion and at rest, Diaphoresis, Dizziness, DOE, Fatigue, Near Syncope, Palpitations, Rapid HR and SOB   Nuclear Pre-Procedure Caffeine/Decaff Intake:  None NPO After: 7:00pm   Lungs:  clear IV 0.9% NS with Angio Cath:  20g  IV Site: R Antecubital  IV Started by:  Stanton Kidney, EMT-P  Chest Size (in):  48 Cup Size: n/a  Height: 5\' 9"  (1.753 m)  Weight:  240 lb (108.863 kg)  BMI:  Body mass index is 35.44 kg/(m^2). Tech Comments:  Metoprolol held > 24 hours, per patient. CBG= 128 yesterday, per patient.    Nuclear Med Study 1 or 2 day study: 1 day  Stress Test Type:  Treadmill/Lexiscan  Reading MD: Dietrich Pates, MD  Order Authorizing Provider:  Gunnar Fusi Ross,MD  Resting Radionuclide: Technetium 43m Tetrofosmin  Resting Radionuclide Dose: 11 mCi   Stress Radionuclide:  Technetium 72m Tetrofosmin  Stress Radionuclide Dose: 33 mCi           Stress Protocol Rest HR: 66 Stress HR: 118  Rest BP: 138/84 Stress BP: 136/67  Exercise Time (min): 9:15 METS: 8.10   Predicted Max HR: 167 bpm % Max HR: 70.66 bpm Rate Pressure Product: 78469   Dose of Adenosine (mg):  n/a Dose of Lexiscan: 0.4 mg  Dose of Atropine (mg): n/a Dose of Dobutamine: n/a mcg/kg/min (at max HR)  Stress Test Technologist: Cathlyn Parsons, RN  Nuclear Technologist:  Domenic Polite, CNMT     Rest Procedure:  Myocardial perfusion imaging was performed at rest 45 minutes following the intravenous administration of  Technetium 60m Tetrofosmin. Rest ECG: NSR with IRBBB  Stress Procedure:  The patient exercised for 9:15.  The patient was unable to achieve target after 7:15, therefore performed an Lexiscan low level. The patient received IV Lexiscan 0.4mg  over 15-seconds with low level exercise and then Technetium 74m Tetrofosmin  was injected at 30-seconds. There were no significant ST-T wave changes.No ectopy noted. Myocardial perfusion imaging was performed after a 45 minute delay. Stress ECG: No significant change from baseline ECG  QPS Raw Data Images:  Images were motion corrected. Stress Images:  Normal perfusion with minimal apical thinning. Rest Images:  Minimal change from stress images, not significant for ischemia. Subtraction (SDS):  No evidence of ischemia. Transient Ischemic Dilatation (Normal <1.22):  .94 Lung/Heart Ratio (Normal <0.45):  .11  Quantitative Gated Spect Images QGS EDV:  108 ml QGS ESV:  38 ml QGS cine images:  NL LV Function; NL Wall Motion QGS EF: 65%  Impression Exercise Capacity:  Good exercise capacity. BP Response:  Normal blood pressure response. Clinical Symptoms:  No chest pain. ECG Impression:  No significant ST segment change suggestive of ischemia. Comparison with Prior Nuclear Study: No images to compare  Overall Impression:  Normal stress nuclear study.  LVEF 65%. Dietrich Pates

## 2011-05-07 ENCOUNTER — Other Ambulatory Visit: Payer: Self-pay | Admitting: Family Medicine

## 2011-05-08 ENCOUNTER — Ambulatory Visit: Payer: Managed Care, Other (non HMO)

## 2011-05-08 NOTE — Telephone Encounter (Signed)
Last seen 03/21/11 and filled 04/11/11 # 30. Please advise    KP

## 2011-05-09 ENCOUNTER — Telehealth: Payer: Self-pay | Admitting: Internal Medicine

## 2011-05-09 DIAGNOSIS — I10 Essential (primary) hypertension: Secondary | ICD-10-CM

## 2011-05-09 MED ORDER — AMLODIPINE BESYLATE 2.5 MG PO TABS: 2.5000 mg | ORAL_TABLET | Freq: Every day | ORAL | Status: DC

## 2011-05-09 NOTE — Telephone Encounter (Signed)
Spoke with pt and gave him results of echo and nuclear studies and Dr. Tenny Craw' instructions to start norvasc 2.5 mg daily due to elevated blood pressure at office visit. He is agreeable to starting this medication.  Will send prescription to [facility] in [place] per his request.  He will monitor blood pressure at home and call us if no change after starting norvasc.

## 2011-05-09 NOTE — Telephone Encounter (Signed)
Pt returning call to our office from yesterday. Please call back.

## 2011-05-10 ENCOUNTER — Telehealth: Payer: Self-pay | Admitting: Family Medicine

## 2011-05-10 NOTE — Telephone Encounter (Signed)
Patient was taking metformin 1 pill  twice a day - he is now taking  2 pills twice a day - needs new rx [facility] - [place]

## 2011-05-10 NOTE — Telephone Encounter (Signed)
Discussed with patient and his Rx was already at the pharmacy    KP

## 2011-05-22 ENCOUNTER — Other Ambulatory Visit: Payer: Self-pay | Admitting: Family Medicine

## 2011-05-23 ENCOUNTER — Telehealth: Payer: Self-pay | Admitting: Family Medicine

## 2011-05-23 DIAGNOSIS — F988 Other specified behavioral and emotional disorders with onset usually occurring in childhood and adolescence: Secondary | ICD-10-CM

## 2011-05-23 MED ORDER — METHYLPHENIDATE HCL 10 MG PO TABS: 10.0000 mg | ORAL_TABLET | Freq: Three times a day (TID) | ORAL | Status: DC

## 2011-05-23 NOTE — Telephone Encounter (Signed)
Noted MD sent via escript 

## 2011-05-23 NOTE — Telephone Encounter (Signed)
Last OV 03-21-11 last refill 03-21-11

## 2011-05-23 NOTE — Telephone Encounter (Signed)
Rx printed

## 2011-05-23 NOTE — Telephone Encounter (Signed)
Patient needs refill ritalin  - will pick up Thursday [phone]

## 2011-05-28 ENCOUNTER — Other Ambulatory Visit: Payer: Self-pay | Admitting: Family Medicine

## 2011-06-06 ENCOUNTER — Other Ambulatory Visit: Payer: Self-pay | Admitting: Family Medicine

## 2011-06-07 NOTE — Telephone Encounter (Signed)
Last seen 03/21/11 and filled 05/08/11 please advise   KP

## 2011-06-07 NOTE — Telephone Encounter (Signed)
Dr.Lowne patient     KP

## 2011-06-11 ENCOUNTER — Other Ambulatory Visit: Payer: Self-pay | Admitting: Family Medicine

## 2011-06-12 ENCOUNTER — Ambulatory Visit: Payer: Managed Care, Other (non HMO) | Admitting: Physician Assistant

## 2011-06-12 ENCOUNTER — Ambulatory Visit: Payer: Managed Care, Other (non HMO)

## 2011-06-12 NOTE — Telephone Encounter (Signed)
To dr Laury Axon please

## 2011-06-29 ENCOUNTER — Ambulatory Visit: Payer: Managed Care, Other (non HMO)

## 2011-07-05 ENCOUNTER — Other Ambulatory Visit: Payer: Self-pay | Admitting: Family Medicine

## 2011-07-05 NOTE — Telephone Encounter (Signed)
LAST OV 03-21-11, LAST REFILLED 01-04-11 #30

## 2011-07-12 ENCOUNTER — Other Ambulatory Visit: Payer: Self-pay | Admitting: Family Medicine

## 2011-07-18 ENCOUNTER — Ambulatory Visit (INDEPENDENT_AMBULATORY_CARE_PROVIDER_SITE_OTHER): Payer: Managed Care, Other (non HMO) | Admitting: Internal Medicine

## 2011-07-18 ENCOUNTER — Other Ambulatory Visit: Payer: Self-pay | Admitting: Family Medicine

## 2011-07-18 ENCOUNTER — Telehealth: Payer: Self-pay | Admitting: Family Medicine

## 2011-07-18 VITALS — BP 130/86 | HR 63 | Temp 98.0°F | Ht 70.0 in | Wt 251.0 lb

## 2011-07-18 DIAGNOSIS — R05 Cough: Secondary | ICD-10-CM

## 2011-07-18 MED ORDER — TRAMADOL HCL 50 MG PO TABS: 50.0000 mg | ORAL_TABLET | Freq: Three times a day (TID) | ORAL | Status: DC | PRN

## 2011-07-18 MED ORDER — BENZONATATE 100 MG PO CAPS: 100.0000 mg | ORAL_CAPSULE | Freq: Four times a day (QID) | ORAL | Status: DC | PRN

## 2011-07-18 MED ORDER — AMOXICILLIN-POT CLAVULANATE 500-125 MG PO TABS: 1.0000 | ORAL_TABLET | Freq: Three times a day (TID) | ORAL | Status: AC

## 2011-07-18 NOTE — Telephone Encounter (Signed)
Ok to refill x 1  

## 2011-07-18 NOTE — Progress Notes (Signed)
  Subjective:    Patient ID: Jesse Vargas, male    DOB: Sep 10, 1955, 56 y.o.   MRN: [id number]  HPI Acute visit Had "the flu" 2 weeks ago, doing better except for: Sinus and chest congestion, cough, severe sore throat thinks related to cough.   Past Medical History  Diagnosis Date  . Diabetes mellitus   . Hypertension   . Chronic kidney disease     Kidney cancer 1999  . Hyperlipidemia   . Sleep apnea   . Heart disease     Review of Systems No recent fever or chills Mild shortness of breath and some wheezing. Occasionally he has seen blood in the sputum (or fom the nasal d/c, not sure), a few drops.     Objective:   Physical Exam  Constitutional: He appears well-developed. No distress.  HENT:  Head: Normocephalic and atraumatic.  Right Ear: External ear normal.  Left Ear: External ear normal.  Nose: Nose normal.       Post surgical changes noted, green discharge noted in the posterior throat. No redness. Face symmetric, not tender.  Cardiovascular: Normal rate, regular rhythm and normal heart sounds.   No murmur heard. Pulmonary/Chest: Effort normal and breath sounds normal. No respiratory distress. He has no wheezes. He has no rales.  Skin: He is not diaphoretic.       Assessment & Plan:  Cough: Had symptoms consistent with the flu --per patient --2 weeks ago presents with sore throat, cough,  discharge from the sinuses as well as the sputum. Most likely has bronchitis and sinusitis. Will treat with abx, Tessalon Perles. I also going to get a chest x-ray due  To reported  hemoptysis although is likely related with bronchitis or sinusitis.

## 2011-07-18 NOTE — Patient Instructions (Signed)
Get the x-rays done at the other [place] office Rest, fluids , tylenol For cough, take Mucinex DM twice a day as needed  You can also use Tessalon Perles as needed  Take the antibiotic as prescribed ----> augmentin  Call if no better in few days Call anytime if the symptoms are severe

## 2011-07-18 NOTE — Telephone Encounter (Signed)
Please advise      KP 

## 2011-07-19 ENCOUNTER — Encounter: Payer: Self-pay | Admitting: Internal Medicine

## 2011-07-19 ENCOUNTER — Ambulatory Visit (INDEPENDENT_AMBULATORY_CARE_PROVIDER_SITE_OTHER)
Admission: RE | Admit: 2011-07-19 | Discharge: 2011-07-19 | Disposition: A | Discharge status: Home / Self Care | Payer: Managed Care, Other (non HMO) | Source: Ambulatory Visit | Attending: Internal Medicine | Admitting: Internal Medicine

## 2011-07-19 ENCOUNTER — Other Ambulatory Visit: Payer: Self-pay | Admitting: Family Medicine

## 2011-07-19 DIAGNOSIS — R05 Cough: Secondary | ICD-10-CM

## 2011-07-20 ENCOUNTER — Other Ambulatory Visit: Payer: Self-pay | Admitting: Family Medicine

## 2011-08-15 ENCOUNTER — Other Ambulatory Visit: Payer: Self-pay | Admitting: Family Medicine

## 2011-08-15 MED ORDER — MELOXICAM 15 MG PO TABS: 15.0000 mg | ORAL_TABLET | Freq: Every day | ORAL | Status: DC

## 2011-08-15 NOTE — Telephone Encounter (Signed)
Last seen 03/21/11 and filled 07/05/11

## 2011-08-16 ENCOUNTER — Other Ambulatory Visit: Payer: Self-pay | Admitting: Family Medicine

## 2011-08-25 ENCOUNTER — Other Ambulatory Visit: Payer: Self-pay | Admitting: Family Medicine

## 2011-08-28 NOTE — Telephone Encounter (Signed)
Last seen 03/21/11 and filled 07/19/11 # 30. Please advise     KP

## 2011-08-31 ENCOUNTER — Other Ambulatory Visit: Payer: Self-pay | Admitting: Family Medicine

## 2011-09-07 ENCOUNTER — Other Ambulatory Visit: Payer: Self-pay | Admitting: Family Medicine

## 2011-09-14 ENCOUNTER — Other Ambulatory Visit: Payer: Self-pay | Admitting: Family Medicine

## 2011-09-19 ENCOUNTER — Other Ambulatory Visit: Payer: Self-pay | Admitting: Family Medicine

## 2011-10-04 ENCOUNTER — Other Ambulatory Visit: Payer: Self-pay | Admitting: Family Medicine

## 2011-10-04 DIAGNOSIS — F988 Other specified behavioral and emotional disorders with onset usually occurring in childhood and adolescence: Secondary | ICD-10-CM

## 2011-10-04 MED ORDER — METHYLPHENIDATE HCL 10 MG PO TABS: 10.0000 mg | ORAL_TABLET | Freq: Three times a day (TID) | ORAL | Status: DC

## 2011-10-04 NOTE — Telephone Encounter (Signed)
Patient would like to pick up a RX for  (RITALIN) 10 MG tablet  Please call Britta Mccreedy at [phone] when ready

## 2011-10-04 NOTE — Telephone Encounter (Signed)
Patient aware Rx will be ready for pick     KP

## 2011-10-08 ENCOUNTER — Other Ambulatory Visit: Payer: Self-pay | Admitting: Family Medicine

## 2011-10-11 ENCOUNTER — Other Ambulatory Visit: Payer: Self-pay | Admitting: Family Medicine

## 2011-10-12 ENCOUNTER — Telehealth: Payer: Self-pay | Admitting: Family Medicine

## 2011-10-12 NOTE — Telephone Encounter (Signed)
Referral put in, please schedule     KP

## 2011-10-12 NOTE — Telephone Encounter (Signed)
Patient called and stated he would like to be referred again to the same urologist as last year. Based on his Chart it looks like it was to Hassan Rowan on 9.11.2012 Patient states he would like to be referred for his testosterone levels Patient ph# [phone]

## 2011-10-12 NOTE — Telephone Encounter (Signed)
Please advise      KP 

## 2011-10-12 NOTE — Telephone Encounter (Signed)
Ok to refer back? 

## 2011-10-13 MED ORDER — TRAMADOL HCL 50 MG PO TABS: 50.0000 mg | ORAL_TABLET | Freq: Three times a day (TID) | ORAL | Status: DC | PRN

## 2011-10-13 NOTE — Telephone Encounter (Signed)
Pt called requesting a refill on tramadol. He would like qty 90. He has fasting appt scheduled for 10-19-11.

## 2011-10-13 NOTE — Telephone Encounter (Signed)
Refill x1 

## 2011-10-13 NOTE — Telephone Encounter (Signed)
Last filled 07/18/11. Please advise     KP

## 2011-10-19 ENCOUNTER — Ambulatory Visit (INDEPENDENT_AMBULATORY_CARE_PROVIDER_SITE_OTHER): Payer: Commercial Managed Care - PPO | Admitting: Family Medicine

## 2011-10-19 ENCOUNTER — Encounter: Payer: Self-pay | Admitting: Family Medicine

## 2011-10-19 VITALS — BP 136/74 | HR 73 | Temp 97.0°F | Wt 242.0 lb

## 2011-10-19 DIAGNOSIS — I2510 Atherosclerotic heart disease of native coronary artery without angina pectoris: Secondary | ICD-10-CM

## 2011-10-19 DIAGNOSIS — E119 Type 2 diabetes mellitus without complications: Secondary | ICD-10-CM

## 2011-10-19 DIAGNOSIS — R5381 Other malaise: Secondary | ICD-10-CM

## 2011-10-19 DIAGNOSIS — R5383 Other fatigue: Secondary | ICD-10-CM

## 2011-10-19 DIAGNOSIS — J309 Allergic rhinitis, unspecified: Secondary | ICD-10-CM

## 2011-10-19 DIAGNOSIS — J302 Other seasonal allergic rhinitis: Secondary | ICD-10-CM

## 2011-10-19 DIAGNOSIS — I10 Essential (primary) hypertension: Secondary | ICD-10-CM

## 2011-10-19 DIAGNOSIS — E785 Hyperlipidemia, unspecified: Secondary | ICD-10-CM

## 2011-10-19 LAB — MICROALBUMIN / CREATININE URINE RATIO
Creatinine,U: 210 mg/dL
Microalb Creat Ratio: 11.7 mg/g (ref 0.0–30.0)
Microalb, Ur: 24.5 mg/dL — ABNORMAL HIGH (ref 0.0–1.9)

## 2011-10-19 LAB — POCT URINALYSIS DIPSTICK
Blood, UA: NEGATIVE
Protein, UA: NEGATIVE
Spec Grav, UA: 1.01
Urobilinogen, UA: 0.2

## 2011-10-19 LAB — BASIC METABOLIC PANEL
Chloride: 100 mEq/L (ref 96–112)
GFR: 119.05 mL/min (ref >60.00)
Glucose, Bld: 81 mg/dL (ref 70–99)
Potassium: 3.9 mEq/L (ref 3.5–5.1)
Sodium: 140 mEq/L (ref 135–145)

## 2011-10-19 LAB — LIPID PANEL
HDL: 41.3 mg/dL (ref >39.00)
VLDL: 109.6 mg/dL — ABNORMAL HIGH (ref 0.0–40.0)

## 2011-10-19 LAB — HEPATIC FUNCTION PANEL
AST: 44 U/L — ABNORMAL HIGH (ref 0–37)
Alkaline Phosphatase: 59 U/L (ref 39–117)
Bilirubin, Direct: 0.1 mg/dL (ref 0.0–0.3)
Total Protein: 7.9 g/dL (ref 6.0–8.3)

## 2011-10-19 MED ORDER — LORATADINE 10 MG PO TABS: ORAL_TABLET | ORAL | Status: DC

## 2011-10-19 MED ORDER — METFORMIN HCL 1000 MG PO TABS: 500.0000 mg | ORAL_TABLET | Freq: Every day | ORAL | Status: DC

## 2011-10-19 MED ORDER — LIRAGLUTIDE 18 MG/3ML ~~LOC~~ SOLN: SUBCUTANEOUS | Status: DC

## 2011-10-19 NOTE — Assessment & Plan Note (Signed)
Stable con't meds 

## 2011-10-19 NOTE — Assessment & Plan Note (Signed)
Check labs con't meds 

## 2011-10-19 NOTE — Assessment & Plan Note (Addendum)
Check labs Pt instructed on victoza Cut glucophage in half Pt to go to ophth

## 2011-10-19 NOTE — Patient Instructions (Signed)

## 2011-10-19 NOTE — Assessment & Plan Note (Signed)
con't current meds Pt sees cardiology

## 2011-10-19 NOTE — Progress Notes (Signed)
  Subjective:    Patient ID: Jesse Vargas, male    DOB: 02-Jul-1956, 56 y.o.   MRN: [id number]  HPI  Pt here for f/u.  Pt states he is struggling to lose weight.  He feels the DM is making his stomach feel hungry.  He is interested in victoza.   HYPERTENSION Disease Monitoring Blood pressure range- 120-138/ 70-90 Chest pain- no      Dyspnea- no Medications Compliance- good Lightheadedness- no   Edema- no   DIABETES Disease Monitoring Blood Sugar ranges-75-120 Polyuria- no New Visual problems- no Medications Compliance- good Hypoglycemic symptoms- occassionally   HYPERLIPIDEMIA Disease Monitoring See symptoms for Hypertension Medications Compliance- good RUQ pain- no  Muscle aches- no  ROS See HPI above   PMH Smoking Status noted   Review of Systems as above   Objective:   Physical Exam  Constitutional: He is oriented to person, place, and time. He appears well-developed and well-nourished.  Neck: Normal range of motion. Neck supple.  Cardiovascular: Normal rate and regular rhythm.   No murmur heard. Pulmonary/Chest: Effort normal and breath sounds normal. No respiratory distress. He has no wheezes. He has no rales.  Musculoskeletal: He exhibits no edema and no tenderness.  Neurological: He is alert and oriented to person, place, and time.  Psychiatric: He has a normal mood and affect. His behavior is normal. Judgment and thought content normal.   Sensory exam of the foot is normal, tested with the monofilament. Good pulses, no lesions or ulcers, good peripheral pulses.       Assessment & Plan:

## 2011-10-20 ENCOUNTER — Other Ambulatory Visit: Payer: Self-pay | Admitting: Family Medicine

## 2011-10-20 LAB — HEMOGLOBIN A1C: Hgb A1c MFr Bld: 6.3 % (ref 4.6–6.5)

## 2011-10-23 ENCOUNTER — Other Ambulatory Visit: Payer: Self-pay | Admitting: Family Medicine

## 2011-10-23 NOTE — Telephone Encounter (Signed)
Last OV 10-19-11, Last Filled 08-15-11 #30 1

## 2011-10-31 ENCOUNTER — Ambulatory Visit (INDEPENDENT_AMBULATORY_CARE_PROVIDER_SITE_OTHER): Payer: Commercial Managed Care - PPO | Admitting: Urology

## 2011-10-31 DIAGNOSIS — N529 Male erectile dysfunction, unspecified: Secondary | ICD-10-CM

## 2011-10-31 DIAGNOSIS — E291 Testicular hypofunction: Secondary | ICD-10-CM

## 2011-11-06 ENCOUNTER — Other Ambulatory Visit: Payer: Self-pay | Admitting: Family Medicine

## 2011-11-07 ENCOUNTER — Other Ambulatory Visit: Payer: Self-pay | Admitting: Family Medicine

## 2011-11-07 NOTE — Telephone Encounter (Signed)
Last filled 10/13/11 # 90. Please advise   KP

## 2011-11-08 ENCOUNTER — Telehealth: Payer: Self-pay | Admitting: Internal Medicine

## 2011-11-08 ENCOUNTER — Telehealth: Payer: Self-pay

## 2011-11-08 NOTE — Telephone Encounter (Signed)
Patient has a friend that is a diabetic and takes Carvedilol and when he started his weight went down 30-40 pounds.  Was calling to see if this might be a good alternative to his Metoprolol.  Patient states he is not having any problems with his current medications. Will forward to Dr Tenny Craw for review

## 2011-11-08 NOTE — Telephone Encounter (Signed)
I am not sure it will helpwith wt.  Could try.  May help with DM Would try carvedilol 12.5 bid.   Needs to have BP followed.

## 2011-11-08 NOTE — Telephone Encounter (Signed)
Discussed with patient and he stated he was unable to get the Victoza because he lost his insurance due to switching jobs and he will not have insurance for another 3-4 weeks. He stated the Victoza is working for him and he has lost 12 lbs already and his CBG's he has been steady around 110. He would like to get samples until he gets his insurance.     KP

## 2011-11-08 NOTE — Telephone Encounter (Signed)
New msg Pt wants to talk to you about metoprolol and having diabetes. Could he switch to another med?

## 2011-11-08 NOTE — Telephone Encounter (Signed)
Ok to give samples

## 2011-11-09 ENCOUNTER — Telehealth: Payer: Self-pay | Admitting: *Deleted

## 2011-11-09 NOTE — Telephone Encounter (Signed)
Pt calling to request Rx for cortisone to be sent in to pharmacy so that his wife can administer them at home when he has flare up of joint pain. Pt notes that he usually get these done at ortho but since he does not have insurance he though it would be easier to just have them done at home. Verbally advise Dr Laury Axon who states that she does not feel comfortable with Rx this med and Pt will need to contact ortho, Pt ok info and states that he will contact ortho.

## 2011-11-09 NOTE — Telephone Encounter (Signed)
LMOM for call back concerning medications.

## 2011-11-09 NOTE — Telephone Encounter (Signed)
Patient aware, samples left in the fridge.    KP

## 2011-11-13 NOTE — Telephone Encounter (Signed)
LMOM for call back. 

## 2011-11-15 NOTE — Telephone Encounter (Signed)
LM for call back on home phone and cell phone.

## 2011-11-20 MED ORDER — CARVEDILOL 12.5 MG PO TABS: 12.5000 mg | ORAL_TABLET | Freq: Two times a day (BID) | ORAL | Status: DC

## 2011-11-20 NOTE — Telephone Encounter (Signed)
Patient called back. Advised per Dr.Ross that he can stop Metoprolol and start Coreg 12.5 mg 2 times per day and to call if BP /HR is elevated. He checks blood sugar every day and will monitor to see if there is any change.

## 2011-11-29 ENCOUNTER — Other Ambulatory Visit: Payer: Self-pay | Admitting: *Deleted

## 2011-11-29 MED ORDER — MELOXICAM 15 MG PO TABS: 15.0000 mg | ORAL_TABLET | Freq: Every day | ORAL | Status: DC

## 2011-11-29 NOTE — Telephone Encounter (Signed)
Ok to refill  5 refills

## 2011-11-29 NOTE — Telephone Encounter (Signed)
Rx faxed.    KP 

## 2011-11-29 NOTE — Telephone Encounter (Signed)
Last OV 10-19-11, last filled 10-23-11 #30

## 2011-12-01 ENCOUNTER — Telehealth: Payer: Self-pay | Admitting: *Deleted

## 2011-12-01 NOTE — Telephone Encounter (Signed)
Pt would like to know if it would be possible to increase disp # to 120 per month. Pt indicated that he has been having a lot of pain in his shoulder so he has to take med more regularly. Pt advise that Dr Laury Axon out the office Pt indicated that it is ok to wait until Tuesday when she returns. .Please advise

## 2011-12-05 MED ORDER — TRAMADOL HCL 50 MG PO TABS: 50.0000 mg | ORAL_TABLET | Freq: Three times a day (TID) | ORAL | Status: DC

## 2011-12-05 NOTE — Telephone Encounter (Signed)
i will increase to #120 1x ---- he will need to see ortho if his shoulder does not improve

## 2011-12-05 NOTE — Telephone Encounter (Signed)
Discussed with patient and he is aware of Dr.Lowne recommendations and agreed to follow up with Ortho,I requested all noted form Ortho be sent to [facility] and he agreed.       KP

## 2011-12-07 ENCOUNTER — Other Ambulatory Visit: Payer: Self-pay | Admitting: Family Medicine

## 2011-12-13 ENCOUNTER — Telehealth: Payer: Self-pay | Admitting: Internal Medicine

## 2011-12-13 NOTE — Telephone Encounter (Signed)
I spoke with the pt and he was started on Carvedilol 12.5mg  bid for BP management.  The pt's wife is a Engineer, civil (consulting) and she has been monitoring the pt's vital signs.  The pt's BP remained elevated while on this dose so the pt doubled up and started taking Carvedilol 25mg  bid. BP on average 135/80, pulse 75.  The pt needs a new Rx for higher dose of Carvedilol.  The pt would like a 90 day supply sent to [facility] in [place]. I will forward this message to Dr Tenny Craw to review and make sure this dose is appropriate for the patient. The pt would like a phone call when Rx has been sent to the pharmacy. The pt ran out of Carvedilol 2 days ago and restarted his Metoprolol at this time.

## 2011-12-13 NOTE — Telephone Encounter (Signed)
OK to be on coreg 25 bid Needs Rx called in.

## 2011-12-13 NOTE — Telephone Encounter (Signed)
New msg Pt wants to talk to you about his meds. He needs another rx for new BP med that he has been doubling to get BP down.

## 2011-12-14 MED ORDER — CARVEDILOL 25 MG PO TABS: 25.0000 mg | ORAL_TABLET | Freq: Two times a day (BID) | ORAL | Status: DC

## 2011-12-14 NOTE — Telephone Encounter (Signed)
Rx sent to pharmacy and pt aware. 

## 2011-12-21 ENCOUNTER — Telehealth: Payer: Self-pay | Admitting: Family Medicine

## 2011-12-21 DIAGNOSIS — F988 Other specified behavioral and emotional disorders with onset usually occurring in childhood and adolescence: Secondary | ICD-10-CM

## 2011-12-21 MED ORDER — METHYLPHENIDATE HCL 10 MG PO TABS: 10.0000 mg | ORAL_TABLET | Freq: Three times a day (TID) | ORAL | Status: DC

## 2011-12-21 NOTE — Telephone Encounter (Signed)
Patient aware Rx ready for pick up.      KP 

## 2011-12-21 NOTE — Telephone Encounter (Signed)
Pt would like rx for ritalin. Call wifes # [phone] when ready.

## 2011-12-28 ENCOUNTER — Other Ambulatory Visit: Payer: Self-pay | Admitting: Family Medicine

## 2012-01-13 ENCOUNTER — Other Ambulatory Visit: Payer: Self-pay | Admitting: Family Medicine

## 2012-01-15 NOTE — Telephone Encounter (Signed)
Last OV 10-19-11, last filled 12-05-11 #120

## 2012-01-30 ENCOUNTER — Telehealth: Payer: Self-pay | Admitting: Family Medicine

## 2012-01-30 DIAGNOSIS — F988 Other specified behavioral and emotional disorders with onset usually occurring in childhood and adolescence: Secondary | ICD-10-CM

## 2012-01-30 MED ORDER — METHYLPHENIDATE HCL 10 MG PO TABS: 10.0000 mg | ORAL_TABLET | Freq: Three times a day (TID) | ORAL | Status: DC

## 2012-01-30 NOTE — Telephone Encounter (Signed)
Pts wife called requesting rx for Ritalin. She also states the pt has no insurance right now and would like to know if we have samples of victoza available. Call [phone]

## 2012-01-30 NOTE — Telephone Encounter (Signed)
Patient aware Rx ready for pick up.      KP 

## 2012-02-02 ENCOUNTER — Ambulatory Visit: Payer: Commercial Managed Care - PPO | Admitting: Urology

## 2012-02-13 ENCOUNTER — Other Ambulatory Visit: Payer: Self-pay | Admitting: Family Medicine

## 2012-02-13 NOTE — Telephone Encounter (Signed)
Last filled 01/15/12 # 120. Please advise     KP

## 2012-02-14 ENCOUNTER — Other Ambulatory Visit: Payer: Self-pay | Admitting: Family Medicine

## 2012-03-01 ENCOUNTER — Other Ambulatory Visit: Payer: Self-pay | Admitting: Family Medicine

## 2012-03-19 ENCOUNTER — Other Ambulatory Visit: Payer: Self-pay | Admitting: Family Medicine

## 2012-03-26 ENCOUNTER — Encounter: Payer: Self-pay | Admitting: Family Medicine

## 2012-03-26 ENCOUNTER — Ambulatory Visit (INDEPENDENT_AMBULATORY_CARE_PROVIDER_SITE_OTHER): Payer: 59 | Admitting: Family Medicine

## 2012-03-26 VITALS — BP 124/76 | HR 76 | Temp 98.0°F | Ht 70.0 in | Wt 235.8 lb

## 2012-03-26 DIAGNOSIS — Z0000 Encounter for general adult medical examination without abnormal findings: Secondary | ICD-10-CM

## 2012-03-26 DIAGNOSIS — I10 Essential (primary) hypertension: Secondary | ICD-10-CM

## 2012-03-26 DIAGNOSIS — Z7251 High risk heterosexual behavior: Secondary | ICD-10-CM

## 2012-03-26 DIAGNOSIS — E785 Hyperlipidemia, unspecified: Secondary | ICD-10-CM

## 2012-03-26 DIAGNOSIS — F988 Other specified behavioral and emotional disorders with onset usually occurring in childhood and adolescence: Secondary | ICD-10-CM

## 2012-03-26 DIAGNOSIS — I2510 Atherosclerotic heart disease of native coronary artery without angina pectoris: Secondary | ICD-10-CM

## 2012-03-26 DIAGNOSIS — Z23 Encounter for immunization: Secondary | ICD-10-CM

## 2012-03-26 DIAGNOSIS — E119 Type 2 diabetes mellitus without complications: Secondary | ICD-10-CM

## 2012-03-26 LAB — POCT URINALYSIS DIPSTICK
Bilirubin, UA: NEGATIVE
Blood, UA: NEGATIVE
Glucose, UA: NEGATIVE
Leukocytes, UA: NEGATIVE
Nitrite, UA: NEGATIVE
Urobilinogen, UA: 0.2

## 2012-03-26 MED ORDER — LIRAGLUTIDE 18 MG/3ML ~~LOC~~ SOLN: SUBCUTANEOUS | Status: DC

## 2012-03-26 MED ORDER — TRAMADOL HCL 50 MG PO TABS: ORAL_TABLET | ORAL | Status: DC

## 2012-03-26 MED ORDER — GABAPENTIN 300 MG PO CAPS: 300.0000 mg | ORAL_CAPSULE | Freq: Three times a day (TID) | ORAL | Status: DC

## 2012-03-26 MED ORDER — METHYLPHENIDATE HCL 10 MG PO TABS: 10.0000 mg | ORAL_TABLET | Freq: Three times a day (TID) | ORAL | Status: DC

## 2012-03-26 NOTE — Patient Instructions (Addendum)
Preventive Care for Adults, Male A healthy lifestyle and preventative care can promote health and wellness. Preventative health guidelines for men include the following key practices:  A routine yearly physical is a good way to check with your caregiver about your health and preventative screening. It is a chance to share any concerns and updates on your health, and to receive a thorough exam.   Visit your dentist for a routine exam and preventative care every 6 months. Brush your teeth twice a day and floss once a day. Good oral hygiene prevents tooth decay and gum disease.   The frequency of eye exams is based on your age, health, family medical history, use of contact lenses, and other factors. Follow your caregiver's recommendations for frequency of eye exams.   Eat a healthy diet. Foods like vegetables, fruits, whole grains, low-fat dairy products, and lean protein foods contain the nutrients you need without too many calories. Decrease your intake of foods high in solid fats, added sugars, and salt. Eat the right amount of calories for you.Get information about a proper diet from your caregiver, if necessary.   Regular physical exercise is one of the most important things you can do for your health. Most adults should get at least 150 minutes of moderate-intensity exercise (any activity that increases your heart rate and causes you to sweat) each week. In addition, most adults need muscle-strengthening exercises on 2 or more days a week.   Maintain a healthy weight. The body mass index (BMI) is a screening tool to identify possible weight problems. It provides an estimate of body fat based on height and weight. Your caregiver can help determine your BMI, and can help you achieve or maintain a healthy weight.For adults 20 years and older:   A BMI below 18.5 is considered underweight.   A BMI of 18.5 to 24.9 is normal.   A BMI of 25 to 29.9 is considered overweight.   A BMI of 30 and above  is considered obese.   Maintain normal blood lipids and cholesterol levels by exercising and minimizing your intake of saturated fat. Eat a balanced diet with plenty of fruit and vegetables. Blood tests for lipids and cholesterol should begin at age 20 and be repeated every 5 years. If your lipid or cholesterol levels are high, you are over 50, or you are a high risk for heart disease, you may need your cholesterol levels checked more frequently.Ongoing high lipid and cholesterol levels should be treated with medicines if diet and exercise are not effective.   If you smoke, find out from your caregiver how to quit. If you do not use tobacco, do not start.   If you choose to drink alcohol, do not exceed 2 drinks per day. One drink is considered to be 12 ounces (355 mL) of beer, 5 ounces (148 mL) of wine, or 1.5 ounces (44 mL) of liquor.   Avoid use of street drugs. Do not share needles with anyone. Ask for help if you need support or instructions about stopping the use of drugs.   High blood pressure causes heart disease and increases the risk of stroke. Your blood pressure should be checked at least every 1 to 2 years. Ongoing high blood pressure should be treated with medicines, if weight loss and exercise are not effective.   If you are 45 to 54 years old, ask your caregiver if you should take aspirin to prevent heart disease.   Diabetes screening involves taking a blood   sample to check your fasting blood sugar level. This should be done once every 3 years, after age 45, if you are within normal weight and without risk factors for diabetes. Testing should be considered at a younger age or be carried out more frequently if you are overweight and have at least 1 risk factor for diabetes.   Colorectal cancer can be detected and often prevented. Most routine colorectal cancer screening begins at the age of 50 and continues through age 75. However, your caregiver may recommend screening at an earlier  age if you have risk factors for colon cancer. On a yearly basis, your caregiver may provide home test kits to check for hidden blood in the stool. Use of a small camera at the end of a tube, to directly examine the colon (sigmoidoscopy or colonoscopy), can detect the earliest forms of colorectal cancer. Talk to your caregiver about this at age 50, when routine screening begins. Direct examination of the colon should be repeated every 5 to 10 years through age 75, unless early forms of pre-cancerous polyps or small growths are found.   Hepatitis C blood testing is recommended for all people born from 1945 through 1965 and any individual with known risks for hepatitis C.   Practice safe sex. Use condoms and avoid high-risk sexual practices to reduce the spread of sexually transmitted infections (STIs). STIs include gonorrhea, chlamydia, syphilis, trichomonas, herpes, HPV, and human immunodeficiency virus (HIV). Herpes, HIV, and HPV are viral illnesses that have no cure. They can result in disability, cancer, and death.   A one-time screening for abdominal aortic aneurysm (AAA) and surgical repair of large AAAs by sound wave imaging (ultrasonography) is recommended for ages 65 to 75 years who are current or former smokers.   Healthy men should no longer receive prostate-specific antigen (PSA) blood tests as part of routine cancer screening. Consult with your caregiver about prostate cancer screening.   Testicular cancer screening is not recommended for adult males who have no symptoms. Screening includes self-exam, caregiver exam, and other screening tests. Consult with your caregiver about any symptoms you have or any concerns you have about testicular cancer.   Use sunscreen with skin protection factor (SPF) of 30 or more. Apply sunscreen liberally and repeatedly throughout the day. You should seek shade when your shadow is shorter than you. Protect yourself by wearing long sleeves, pants, a  wide-brimmed hat, and sunglasses year round, whenever you are outdoors.   Once a month, do a whole body skin exam, using a mirror to look at the skin on your back. Notify your caregiver of new moles, moles that have irregular borders, moles that are larger than a pencil eraser, or moles that have changed in shape or color.   Stay current with required immunizations.   Influenza. You need a dose every fall (or winter). The composition of the flu vaccine changes each year, so being vaccinated once is not enough.   Pneumococcal polysaccharide. You need 1 to 2 doses if you smoke cigarettes or if you have certain chronic medical conditions. You need 1 dose at age 65 (or older) if you have never been vaccinated.   Tetanus, diphtheria, pertussis (Tdap, Td). Get 1 dose of Tdap vaccine if you are younger than age 65 years, are over 65 and have contact with an infant, are a healthcare worker, or simply want to be protected from whooping cough. After that, you need a Td booster dose every 10 years. Consult your caregiver if   you have not had at least 3 tetanus and diphtheria-containing shots sometime in your life or have a deep or dirty wound.   HPV. This vaccine is recommended for males 13 through 54 years of age. This vaccine may be given to men 22 through 54 years of age who have not completed the 3 dose series. It is recommended for men through age 26 who have sex with men or whose immune system is weakened because of HIV infection, other illness, or medications. The vaccine is given in 3 doses over 6 months.   Measles, mumps, rubella (MMR). You need at least 1 dose of MMR if you were born in 1957 or later. You may also need a 2nd dose.   Meningococcal. If you are age 19 to 21 years and a first-year college student living in a residence hall, or have one of several medical conditions, you need to get vaccinated against meningococcal disease. You may also need additional booster doses.   Zoster (shingles).  If you are age 60 years or older, you should get this vaccine.   Varicella (chickenpox). If you have never had chickenpox or you were vaccinated but received only 1 dose, talk to your caregiver to find out if you need this vaccine.   Hepatitis A. You need this vaccine if you have a specific risk factor for hepatitis A virus infection, or you simply wish to be protected from this disease. The vaccine is usually given as 2 doses, 6 to 18 months apart.   Hepatitis B. You need this vaccine if you have a specific risk factor for hepatitis B virus infection or you simply wish to be protected from this disease. The vaccine is given in 3 doses, usually over 6 months.  Preventative Service / Frequency Ages 19 to 39  Blood pressure check.** / Every 1 to 2 years.   Lipid and cholesterol check.** / Every 5 years beginning at age 20.   Hepatitis C blood test.** / For any individual with known risks for hepatitis C.   Skin self-exam. / Monthly.   Influenza immunization.** / Every year.   Pneumococcal polysaccharide immunization.** / 1 to 2 doses if you smoke cigarettes or if you have certain chronic medical conditions.   Tetanus, diphtheria, pertussis (Tdap,Td) immunization. / A one-time dose of Tdap vaccine. After that, you need a Td booster dose every 10 years.   HPV immunization. / 3 doses over 6 months, if 26 and younger.   Measles, mumps, rubella (MMR) immunization. / You need at least 1 dose of MMR if you were born in 1957 or later. You may also need a 2nd dose.   Meningococcal immunization. / 1 dose if you are age 19 to 21 years and a first-year college student living in a residence hall, or have one of several medical conditions, you need to get vaccinated against meningococcal disease. You may also need additional booster doses.   Varicella immunization.** / Consult your caregiver.   Hepatitis A immunization.** / Consult your caregiver. 2 doses, 6 to 18 months apart.   Hepatitis B  immunization.** / Consult your caregiver. 3 doses usually over 6 months.  Ages 40 to 64  Blood pressure check.** / Every 1 to 2 years.   Lipid and cholesterol check.** / Every 5 years beginning at age 20.   Fecal occult blood test (FOBT) of stool. / Every year beginning at age 50 and continuing until age 75. You may not have to do this test if   you get colonoscopy every 10 years.   Flexible sigmoidoscopy** or colonoscopy.** / Every 5 years for a flexible sigmoidoscopy or every 10 years for a colonoscopy beginning at age 50 and continuing until age 75.   Hepatitis C blood test.** / For all people born from 1945 through 1965 and any individual with known risks for hepatitis C.   Skin self-exam. / Monthly.   Influenza immunization.** / Every year.   Pneumococcal polysaccharide immunization.** / 1 to 2 doses if you smoke cigarettes or if you have certain chronic medical conditions.   Tetanus, diphtheria, pertussis (Tdap/Td) immunization.** / A one-time dose of Tdap vaccine. After that, you need a Td booster dose every 10 years.   Measles, mumps, rubella (MMR) immunization. / You need at least 1 dose of MMR if you were born in 1957 or later. You may also need a 2nd dose.   Varicella immunization.**/ Consult your caregiver.   Meningococcal immunization.** / Consult your caregiver.   Hepatitis A immunization.** / Consult your caregiver. 2 doses, 6 to 18 months apart.   Hepatitis B immunization.** / Consult your caregiver. 3 doses, usually over 6 months.  Ages 65 and over  Blood pressure check.** / Every 1 to 2 years.   Lipid and cholesterol check.**/ Every 5 years beginning at age 20.   Fecal occult blood test (FOBT) of stool. / Every year beginning at age 50 and continuing until age 75. You may not have to do this test if you get colonoscopy every 10 years.   Flexible sigmoidoscopy** or colonoscopy.** / Every 5 years for a flexible sigmoidoscopy or every 10 years for a colonoscopy  beginning at age 50 and continuing until age 75.   Hepatitis C blood test.** / For all people born from 1945 through 1965 and any individual with known risks for hepatitis C.   Abdominal aortic aneurysm (AAA) screening.** / A one-time screening for ages 65 to 75 years who are current or former smokers.   Skin self-exam. / Monthly.   Influenza immunization.** / Every year.   Pneumococcal polysaccharide immunization.** / 1 dose at age 65 (or older) if you have never been vaccinated.   Tetanus, diphtheria, pertussis (Tdap, Td) immunization. / A one-time dose of Tdap vaccine if you are over 65 and have contact with an infant, are a healthcare worker, or simply want to be protected from whooping cough. After that, you need a Td booster dose every 10 years.   Varicella immunization. ** / Consult your caregiver.   Meningococcal immunization.** / Consult your caregiver.   Hepatitis A immunization. ** / Consult your caregiver. 2 doses, 6 to 18 months apart.   Hepatitis B immunization.** / Check with your caregiver. 3 doses, usually over 6 months.  **Family history and personal history of risk and conditions may change your caregiver's recommendations. Document Released: 08/22/2001 Document Revised: 06/15/2011 Document Reviewed: 11/21/2010 [employer] Patient Information 2012 [employer]. 

## 2012-03-26 NOTE — Assessment & Plan Note (Signed)
Check labs 

## 2012-03-26 NOTE — Assessment & Plan Note (Signed)
Refill meds rto 6 months 

## 2012-03-26 NOTE — Progress Notes (Signed)
Subjective:    Patient ID: Jesse Vargas, male    DOB: 1955/11/01, 56 y.o.   MRN: [id number]  HPI Pt here for cpe and f/u with refills.   Pt c/o itchy dry skin on L foot.     Review of Systems .Review of Systems  Constitutional: Negative for activity change, appetite change and fatigue.  HENT: Negative for hearing loss, congestion, tinnitus and ear discharge.  dentist q78m Eyes: Negative for visual disturbance (see optho q1y -- vision corrected to 20/20 with glasses).  Respiratory: Negative for cough, chest tightness and shortness of breath.   Cardiovascular: Negative for chest pain, palpitations and leg swelling.  Gastrointestinal: Negative for abdominal pain, diarrhea, constipation and abdominal distention.  Genitourinary: Negative for urgency, frequency, decreased urine volume and difficulty urinating.  Musculoskeletal: Negative for back pain, arthralgias and gait problem.  Skin: Negative for color change, pallor and rash.  Neurological: Negative for dizziness, light-headedness, numbness and headaches.  Hematological: Negative for adenopathy. Does not bruise/bleed easily.  Psychiatric/Behavioral: Negative for suicidal ideas, confusion, sleep disturbance, self-injury, dysphoric mood, decreased concentration and agitation.   Past Medical History  Diagnosis Date  . Diabetes mellitus   . Hypertension   . Chronic kidney disease     Kidney cancer 1999  . Hyperlipidemia   . Sleep apnea   . Heart disease    Family History  Problem Relation Age of Onset  . Diabetes Father   . Hypertension Father   . Hyperlipidemia Father   . Colon polyps Father   . Hypertension Mother   . Diabetes Mother   . Colon cancer Neg Hx   . Stomach cancer Neg Hx    Past Surgical History  Procedure Date  . Uvulopalatopharyngoplasty     08/1988  . Tonsillectomy     08/1988  . Cholecystectomy     2000  . Nephrectomy     L---1999 Cancer  . Nasal sinus surgery     2005  . Elbow surgery     left    . Inner ear surgery     R ear  . Ingrown toenail    Current Outpatient Prescriptions on File Prior to Visit  Medication Sig Dispense Refill  . amLODipine (NORVASC) 2.5 MG tablet Take 1 tablet (2.5 mg total) by mouth daily.  30 tablet  11  . aspirin 81 MG tablet Take 81 mg by mouth daily.        . carvedilol (COREG) 25 MG tablet Take 1 tablet (25 mg total) by mouth 2 (two) times daily.  180 tablet  3  . citalopram (CELEXA) 40 MG tablet Take 1 tablet (40 mg total) by mouth daily.  30 tablet  5  . cyclobenzaprine (FLEXERIL) 10 MG tablet TAKE ONE TABLET BY MOUTH AT BEDTIME AS NEEDED  30 tablet  0  . fluticasone (FLONASE) 50 MCG/ACT nasal spray USE TWO SPRAYS IN EACH NOSTRIL EVERY DAY  48 g  0  . gabapentin (NEURONTIN) 300 MG capsule TAKE ONE CAPSULE BY MOUTH THREE TIMES DAILY AS NEEDED  90 capsule  2  . glucose blood (ONE TOUCH TEST STRIPS) test strip Check Blood sugars twice a day  100 each  12  . lisinopril-hydrochlorothiazide (PRINZIDE,ZESTORETIC) 20-25 MG per tablet TAKE ONE TABLET BY MOUTH EVERY DAY  30 tablet  5  . loratadine (CLARITIN) 10 MG tablet 1 po qd prn  30 tablet  11  . meloxicam (MOBIC) 15 MG tablet Take 1 tablet (15 mg total) by mouth daily.  30 tablet  5  . metFORMIN (GLUCOPHAGE) 1000 MG tablet TAKE ONE TABLET BY MOUTH TWICE DAILY WITH MEALS. *REPEAT LABS ARE DUE NOW*  60 tablet  0  . methylphenidate (RITALIN) 10 MG tablet Take 1 tablet (10 mg total) by mouth 3 (three) times daily.  90 tablet  0  . PROAIR HFA 108 (90 BASE) MCG/ACT inhaler INHALE TWO PUFFS 4 TIMES DAILY AS NEEDED  9 g  0  . traMADol (ULTRAM) 50 MG tablet TAKE ONE TABLET BY MOUTH THREE TIMES DAILY  120 tablet  0  . DISCONTD: Liraglutide (VICTOZA) 18 MG/3ML SOLN 0.6 mg Sq qd for 1 week then increase to 1.2 mg sq daily  6 mL    . DISCONTD: metFORMIN (GLUCOPHAGE) 1000 MG tablet Take 0.5 tablets (500 mg total) by mouth at bedtime.  60 tablet  0   Allergies  Allergen Reactions  . Hydrocodone-Acetaminophen Hives  .  Sulfonamide Derivatives Hives         Objective:   Physical Exam  BP 124/76  Pulse 76  Temp 98 F (36.7 C) (Oral)  Ht 5\' 10"  (1.778 m)  Wt 235 lb 12.8 oz (106.958 kg)  BMI 33.83 kg/m2  SpO2 76%  General Appearance:    Alert, cooperative, no distress, appears stated age  Head:    Normocephalic, without obvious abnormality, atraumatic  Eyes:    PERRL, conjunctiva/corneas clear, EOM's intact, fundi    benign, both eyes       Ears:    Normal TM's and external ear canals, both ears  Nose:   Nares normal, septum midline, mucosa normal, no drainage   or sinus tenderness  Throat:   Lips, mucosa, and tongue normal; teeth and gums normal  Neck:   Supple, symmetrical, trachea midline, no adenopathy;       thyroid:  No enlargement/tenderness/nodules; no carotid   bruit or JVD  Back:     Symmetric, no curvature, ROM normal, no CVA tenderness  Lungs:     Clear to auscultation bilaterally, respirations unlabored  Chest wall:    No tenderness or deformity  Heart:    Regular rate and rhythm, S1 and S2 normal, no murmur, rub   or gallop  Abdomen:     Soft, non-tender, bowel sounds active all four quadrants,    no masses, no organomegaly  Genitalia:    Normal male without lesion, discharge or tenderness  Rectal:    Normal tone, normal prostate, no masses or tenderness;   guaiac negative stool  Extremities:   Extremities normal, atraumatic, no cyanosis or edema  Pulses:   2+ and symmetric all extremities  Skin:   Skin color, texture, turgor normal, no rashes or lesions  Lymph nodes:   Cervical, supraclavicular, and axillary nodes normal  Neurologic:   CNII-XII intact. Normal strength, sensation and reflexes      throughout        Assessment & Plan:  cpe -- check labs             ghm utd

## 2012-03-26 NOTE — Assessment & Plan Note (Signed)
Stable con't meds 

## 2012-03-26 NOTE — Assessment & Plan Note (Signed)
con't meds  Check labs 

## 2012-03-26 NOTE — Addendum Note (Signed)
Addended by: Silvio Pate D on: 03/26/2012 01:56 PM   Modules accepted: Orders

## 2012-03-29 ENCOUNTER — Other Ambulatory Visit: Payer: Self-pay | Admitting: Family Medicine

## 2012-03-29 MED ORDER — TRAMADOL HCL 50 MG PO TABS: ORAL_TABLET | ORAL | Status: DC

## 2012-03-29 NOTE — Telephone Encounter (Signed)
Rx resent to [facility]      KP

## 2012-03-29 NOTE — Telephone Encounter (Signed)
Tramamdol HCL tablet

## 2012-04-03 ENCOUNTER — Telehealth: Payer: Self-pay

## 2012-04-03 MED ORDER — ANTARA 130 MG PO CAPS: 130.0000 mg | ORAL_CAPSULE | Freq: Every day | ORAL | Status: DC

## 2012-04-03 MED ORDER — PRAVASTATIN SODIUM 10 MG PO TABS: 10.0000 mg | ORAL_TABLET | Freq: Every day | ORAL | Status: DC

## 2012-04-03 NOTE — Telephone Encounter (Signed)
Results from [facility]-- Per Dr.Lowne patient is to start Pravastatin 10 mg 1 qhs and Antara 130 mg daily and repeat in 3 mos. All other labs Neg. Discussed with patient and he voiced understanding--Rx faxed to [facility]

## 2012-04-05 ENCOUNTER — Encounter: Payer: Commercial Managed Care - PPO | Admitting: Family Medicine

## 2012-04-12 ENCOUNTER — Ambulatory Visit (INDEPENDENT_AMBULATORY_CARE_PROVIDER_SITE_OTHER): Payer: 59 | Admitting: Urology

## 2012-04-12 DIAGNOSIS — N529 Male erectile dysfunction, unspecified: Secondary | ICD-10-CM

## 2012-04-12 DIAGNOSIS — E291 Testicular hypofunction: Secondary | ICD-10-CM

## 2012-04-18 ENCOUNTER — Telehealth: Payer: Self-pay | Admitting: Family Medicine

## 2012-04-18 MED ORDER — CITALOPRAM HYDROBROMIDE 40 MG PO TABS: 40.0000 mg | ORAL_TABLET | Freq: Every day | ORAL | Status: DC

## 2012-04-18 NOTE — Telephone Encounter (Signed)
Refill: Citalopram tab. Request for new prescription from [facility]

## 2012-05-01 ENCOUNTER — Other Ambulatory Visit: Payer: Self-pay

## 2012-05-01 MED ORDER — CYCLOBENZAPRINE HCL 10 MG PO TABS: 10.0000 mg | ORAL_TABLET | Freq: Every evening | ORAL | Status: DC | PRN

## 2012-05-01 NOTE — Telephone Encounter (Deleted)
From Jesse Vargas To Jesse R Lowne, DO [Vargas [id number]] Composed 05/01/2012 12:04 AM For Delivery On 05/01/2012 12:04 AM Subject Non-Urgent Medical Question Message Type Patient Medical Advice Request Read Status Y Message Body What's going on with dr Vargas ?? No, I went back and found the request.. Why prescriptions are still wrong.. U have till Friday to have them all corrected .. Yes .. I ordered ambien the day I had my physical and I sent u and email since them. Plus Jesse Vargas and I both have said everything had to be 90 day scrips.. Get them right ASAP .. If Dr Vargas doesn't understand this get another doctor to do it.. If u look at all the emails I have sent u multiple emails over the last few weeks .. Read them. Just call [facility] the ambien in and I will send u the bill if insurance want pay for it... Yes I am MADD . I haven't slept in over 24 hrs.. I have been out a month. I finally got my flexiral today and it took a month also.. Well, dam it .. It was only 30 pills ..for 90 days, really?? If computers can't count find a calculator... I don't understand the [facility] 90 deal but u should.. Call me.. [phone] or [phone].. Oh, and still no clonopin ..3 pill left... I am very anxious because my right knee replacement has a damaged part and I am expecting a grandbaby any minute..help us with this 90day issue.. Jesse Vargas has sit here and asked me 5 times where is dr. Lowne...   ----- Message -----  From: Jesse Vargas, CMA  Sent: 04/30/2012 8:06 AM EDT  To: Jesse Vargas  Subject: RE: Non-Urgent Medical Question   No request for Ambien has come in since June. I have the request now and I will forward it to the provider covering for Dr.Lowne as well as your request for Ritalin. In the future if you have requested a prescription and have not received a response in 48 hours, call the office so we can take care of the prescription without you running out of medication. On another note your prescription  for Ritalin has been approved by Dr.Tabori for 30 pills and you can come by the office to pick it up at your convenience.   Thank You  Jesse Vargas CMA   ----- Message -----  From: Vargas,Jesse L  Sent: 04/30/2012 12:20 AM EDT  To: Jesse Lowne, DO  Subject: Non-Urgent Medical Question   Sent in request for ambien 10/14 [facility] still hasn't got order.. I haven't filled it in 2 months but down to 3 pills now.. it takes 5 + business dayys to get it.. hope u are back to work.. everytime in past 2 Years when we have medication issue u are out.. hope u r better. I guess the rx for the Ritalin got tossed in mixture in [employer] and news paper. She Jesse Vargas and I both need rx.. let me know today.. I will be working in [place]... Jesse Vargas..    

## 2012-05-01 NOTE — Telephone Encounter (Signed)
Ok for 90

## 2012-05-01 NOTE — Telephone Encounter (Signed)
Patient wife called requesting a 90 day supply of flexeril be sent to [facility].  Last filled 12/07/11 #30. Please advise     KP

## 2012-05-03 ENCOUNTER — Other Ambulatory Visit: Payer: Self-pay

## 2012-05-03 DIAGNOSIS — F988 Other specified behavioral and emotional disorders with onset usually occurring in childhood and adolescence: Secondary | ICD-10-CM

## 2012-05-03 MED ORDER — METHYLPHENIDATE HCL 10 MG PO TABS: 10.0000 mg | ORAL_TABLET | Freq: Three times a day (TID) | ORAL | Status: DC

## 2012-05-03 NOTE — Telephone Encounter (Signed)
Wife walked into the office requesting Rx for Ritalin--Rx given to wife      KP

## 2012-05-08 ENCOUNTER — Other Ambulatory Visit: Payer: Self-pay | Admitting: Family Medicine

## 2012-05-08 MED ORDER — METFORMIN HCL 1000 MG PO TABS: 1000.0000 mg | ORAL_TABLET | Freq: Two times a day (BID) | ORAL | Status: DC

## 2012-05-08 NOTE — Telephone Encounter (Signed)
Metformin to be sent to [facility] in [place] and flex[facility]il to [facility].

## 2012-05-08 NOTE — Telephone Encounter (Signed)
Flexeril was sent 05/01/12 # 90 to [facility].       KP

## 2012-05-11 ENCOUNTER — Other Ambulatory Visit: Payer: Self-pay | Admitting: Family Medicine

## 2012-05-11 DIAGNOSIS — F988 Other specified behavioral and emotional disorders with onset usually occurring in childhood and adolescence: Secondary | ICD-10-CM

## 2012-05-13 MED ORDER — FLUTICASONE PROPIONATE 50 MCG/ACT NA SUSP: 2.0000 | Freq: Every day | NASAL | Status: DC

## 2012-05-13 MED ORDER — CYCLOBENZAPRINE HCL 10 MG PO TABS: 10.0000 mg | ORAL_TABLET | Freq: Every evening | ORAL | Status: DC | PRN

## 2012-05-13 MED ORDER — METHYLPHENIDATE HCL 10 MG PO TABS: 10.0000 mg | ORAL_TABLET | Freq: Three times a day (TID) | ORAL | Status: DC

## 2012-05-13 MED ORDER — ALBUTEROL SULFATE HFA 108 (90 BASE) MCG/ACT IN AERS: 2.0000 | INHALATION_SPRAY | Freq: Four times a day (QID) | RESPIRATORY_TRACT | Status: DC | PRN

## 2012-05-13 NOTE — Telephone Encounter (Signed)
Flexeril was last filled 05/01/12 # 90 and Ritalin filled 05/03/12 #90 requesting 90 day supply on medication and would like sent to [facility].      Please advise    KP

## 2012-05-13 NOTE — Telephone Encounter (Signed)
Can do 90 day ritalin

## 2012-05-13 NOTE — Telephone Encounter (Signed)
Quantity amount corrected to 270.     KP

## 2012-05-13 NOTE — Telephone Encounter (Signed)
[facility] advising Rx ready for pick up.    MW

## 2012-05-15 ENCOUNTER — Other Ambulatory Visit: Payer: Self-pay | Admitting: Internal Medicine

## 2012-06-04 ENCOUNTER — Telehealth: Payer: Self-pay | Admitting: Family Medicine

## 2012-06-04 NOTE — Telephone Encounter (Signed)
Patient Information:  Caller Name: Ravis  Phone: [phone]  Patient: Jesse Vargas, Jesse Vargas  Gender: Male  DOB: Jun 05, 1956  Age: 56 Years  PCP: Lelon Perla.   Symptoms  Reason For Call & Symptoms: Sinus Pressure/Congestion; Rt ear ache, wheezing  Reviewed Health History In EMR: Yes  Reviewed Medications In EMR: Yes  Reviewed Allergies In EMR: Yes  Date of Onset of Symptoms: 06/02/2012  Treatments Tried: Mucinex, Allegra, Nyquil  Treatments Tried Worked: No  Guideline(s) Used:  Sinus Pain and Congestion  Disposition Per Guideline:   See Today in Office  Reason For Disposition Reached:   Earache  Advice Given:  For a Stuffy Nose - Use Nasal Washes:  Introduction  How it Helps:  Methods  Hydration:  Drink plenty of liquids (6-8 glasses of water daily). If the air in your home is dry, use a cool mist humidifier  Office Follow Up:  Does the office need to follow up with this patient?: Yes  Instructions For The Office: Please follow up with patient in regards to medication request.  See patient notes.  Patient Refused Recommendation:  Patient Requests Prescription  Patient cannot come in for recommended appointment unless later today around 4:30.  Would like to have medication called in to Pharmacy.  Please refer to patient notes.  RN Note:  Headache 6/10, Ear pain, Intermittent Bloody discharge from sinuses with sinus pressure and pain. Offered appointment today for evaluation and patients states that the cannot get off work to come in for appointment.   May be able to do a 4:30 appointment later today if that is available or a appointment tomorrow. Would like to know if medication can be called in to [facility].

## 2012-06-04 NOTE — Telephone Encounter (Signed)
Spoke with patient and he complained of an ear infection. I made him aware we can treat him without seeing him in the office first, he voiced understanding. Apt scheduled for tomorrow.       KP

## 2012-06-05 ENCOUNTER — Encounter: Payer: Self-pay | Admitting: Family Medicine

## 2012-06-05 ENCOUNTER — Ambulatory Visit (INDEPENDENT_AMBULATORY_CARE_PROVIDER_SITE_OTHER): Payer: 59 | Admitting: Family Medicine

## 2012-06-05 VITALS — BP 148/86 | HR 68 | Temp 98.1°F | Wt 249.0 lb

## 2012-06-05 DIAGNOSIS — R05 Cough: Secondary | ICD-10-CM

## 2012-06-05 DIAGNOSIS — H6690 Otitis media, unspecified, unspecified ear: Secondary | ICD-10-CM

## 2012-06-05 MED ORDER — ALBUTEROL SULFATE HFA 108 (90 BASE) MCG/ACT IN AERS: 2.0000 | INHALATION_SPRAY | Freq: Four times a day (QID) | RESPIRATORY_TRACT | Status: DC | PRN

## 2012-06-05 MED ORDER — BECLOMETHASONE DIPROPIONATE 40 MCG/ACT IN AERS: 2.0000 | INHALATION_SPRAY | Freq: Two times a day (BID) | RESPIRATORY_TRACT | Status: DC

## 2012-06-05 MED ORDER — AMOXICILLIN-POT CLAVULANATE 875-125 MG PO TABS: 1.0000 | ORAL_TABLET | Freq: Two times a day (BID) | ORAL | Status: DC

## 2012-06-05 NOTE — Progress Notes (Signed)
  Subjective:     Jesse Vargas is a 57 y.o. male who presents for evaluation of symptoms of a URI. Symptoms include right ear pressure/pain, congestion, nasal congestion, non productive cough, sore throat and wheezing. Onset of symptoms was 5 days ago, and has been gradually worsening since that time. Treatment to date: none.  The following portions of the patient's history were reviewed and updated as appropriate: allergies, current medications, past family history, past medical history, past social history, past surgical history and problem list.  Review of Systems Pertinent items are noted in HPI.   Objective:    BP 148/86  Pulse 68  Temp 98.1 F (36.7 C) (Oral)  Wt 249 lb (112.946 kg)  SpO2 98% General appearance: alert, cooperative, appears stated age and mild distress Ears: R ear ---TM errythematous , retracted Nose: Nares normal. Septum midline. Mucosa normal. No drainage or sinus tenderness. Throat: abnormal findings: mild oropharyngeal erythema Neck: moderate anterior cervical adenopathy, supple, symmetrical, trachea midline and thyroid not enlarged, symmetric, no tenderness/mass/nodules Lungs: clear to auscultation bilaterally   Assessment:    otitis media  Pharyngitis Bronchospasm-- proair and qvar  Plan:    Nasal saline spray for congestion. Augmentin per orders. Nasal steroids per orders. Follow up as needed.

## 2012-06-05 NOTE — Patient Instructions (Signed)

## 2012-06-13 ENCOUNTER — Encounter: Payer: Self-pay | Admitting: Family Medicine

## 2012-07-01 ENCOUNTER — Other Ambulatory Visit: Payer: Self-pay | Admitting: Family Medicine

## 2012-07-02 ENCOUNTER — Other Ambulatory Visit: Payer: Self-pay | Admitting: Family Medicine

## 2012-07-02 MED ORDER — TRAMADOL HCL 50 MG PO TABS: ORAL_TABLET | ORAL | Status: DC

## 2012-07-02 NOTE — Telephone Encounter (Signed)
refill  TraMADol HCl (Tab) 50 MG 1 po tid prn -- requesting 90-day supply

## 2012-07-02 NOTE — Telephone Encounter (Signed)
RX and contract placed at the front for pick-up, per Dr.Hopper # 30 only

## 2012-07-05 ENCOUNTER — Telehealth: Payer: Self-pay | Admitting: *Deleted

## 2012-07-05 NOTE — Telephone Encounter (Signed)
Spoke with patient concerning refill on flexeril, last filled 05/13/12 #90. Pt is to take 1 tab po at bedtime, too early for refill. Pateitn requested appt to speak  With Dr. Laury Axon concerning chronic pain, states he went to "arthritis md and he was told his pain may be coming from fibromyalgia. Patient states he has taken up to 6 tramadol in one day to "be able to live", script states to take three times daily. Appointment made with Dr. Laury Axon on Tuesday 12/31 at 1:30pm.

## 2012-07-08 ENCOUNTER — Encounter: Payer: Self-pay | Admitting: Lab

## 2012-07-08 NOTE — Telephone Encounter (Signed)
Last seen 06/05/12 and filled 05/13/12 # 90. Please advise      KP

## 2012-07-09 ENCOUNTER — Other Ambulatory Visit: Payer: Self-pay

## 2012-07-09 ENCOUNTER — Ambulatory Visit (INDEPENDENT_AMBULATORY_CARE_PROVIDER_SITE_OTHER): Payer: 59 | Admitting: Family Medicine

## 2012-07-09 ENCOUNTER — Encounter: Payer: Self-pay | Admitting: Family Medicine

## 2012-07-09 VITALS — BP 152/88 | HR 88 | Temp 98.3°F | Wt 245.0 lb

## 2012-07-09 DIAGNOSIS — K219 Gastro-esophageal reflux disease without esophagitis: Secondary | ICD-10-CM

## 2012-07-09 DIAGNOSIS — E785 Hyperlipidemia, unspecified: Secondary | ICD-10-CM

## 2012-07-09 DIAGNOSIS — E119 Type 2 diabetes mellitus without complications: Secondary | ICD-10-CM

## 2012-07-09 DIAGNOSIS — M797 Fibromyalgia: Secondary | ICD-10-CM | POA: Insufficient documentation

## 2012-07-09 DIAGNOSIS — G8929 Other chronic pain: Secondary | ICD-10-CM

## 2012-07-09 DIAGNOSIS — IMO0001 Reserved for inherently not codable concepts without codable children: Secondary | ICD-10-CM

## 2012-07-09 DIAGNOSIS — I2510 Atherosclerotic heart disease of native coronary artery without angina pectoris: Secondary | ICD-10-CM

## 2012-07-09 DIAGNOSIS — I10 Essential (primary) hypertension: Secondary | ICD-10-CM

## 2012-07-09 MED ORDER — CARVEDILOL 25 MG PO TABS: 25.0000 mg | ORAL_TABLET | Freq: Two times a day (BID) | ORAL | Status: DC

## 2012-07-09 MED ORDER — METFORMIN HCL 1000 MG PO TABS: ORAL_TABLET | ORAL | Status: DC

## 2012-07-09 MED ORDER — OXYCODONE-ACETAMINOPHEN 5-325 MG PO TABS: 1.0000 | ORAL_TABLET | Freq: Three times a day (TID) | ORAL | Status: DC | PRN

## 2012-07-09 MED ORDER — OMEPRAZOLE 20 MG PO CPDR: DELAYED_RELEASE_CAPSULE | ORAL | Status: DC

## 2012-07-09 NOTE — Assessment & Plan Note (Signed)
Check labs 

## 2012-07-09 NOTE — Assessment & Plan Note (Signed)
Dx by rheumatology Refer to pain management Percocet written D/c tramadol

## 2012-07-09 NOTE — Progress Notes (Signed)
  Subjective:    Patient ID: Jesse Vargas, male    DOB: April 11, 1956, 56 y.o.   MRN: [id number]  HPI Pt here to discuss increasing pain with fibromyalgia.  Current meds are no longer working for him.   Review of Systems     Objective:   Physical Exam        Assessment & Plan:

## 2012-07-09 NOTE — Patient Instructions (Addendum)

## 2012-07-09 NOTE — Assessment & Plan Note (Signed)
Check labs con't meds 

## 2012-07-09 NOTE — Assessment & Plan Note (Signed)
Refill meds Schedule labs

## 2012-07-09 NOTE — Assessment & Plan Note (Signed)
May be elevated secondary to pain Recheck 3 months

## 2012-07-11 ENCOUNTER — Telehealth: Payer: Self-pay | Admitting: Family Medicine

## 2012-07-11 ENCOUNTER — Other Ambulatory Visit: Payer: Self-pay | Admitting: Family Medicine

## 2012-07-11 DIAGNOSIS — E119 Type 2 diabetes mellitus without complications: Secondary | ICD-10-CM

## 2012-07-11 MED ORDER — MELOXICAM 15 MG PO TABS: 15.0000 mg | ORAL_TABLET | Freq: Every day | ORAL | Status: DC

## 2012-07-11 MED ORDER — INSULIN PEN NEEDLE 29G X 12.7MM MISC: Status: DC

## 2012-07-11 MED ORDER — INSULIN PEN NEEDLE 32G X 6 MM MISC: 1.8000 mg | Freq: Every day | Status: DC

## 2012-07-11 NOTE — Telephone Encounter (Signed)
i believe the first one is supposed to be pen needles for victoza----ok to give 90 d with 3 refills. mobic 90 day with 3 refills---  1 po qd

## 2012-07-11 NOTE — Telephone Encounter (Signed)
Both [facility]

## 2012-07-11 NOTE — Telephone Encounter (Signed)
1. Penneedle uf mini 2. Meloxicam tab  Pharmacy notes: Please send w/3 refills 90d

## 2012-07-11 NOTE — Telephone Encounter (Signed)
Last seen 07/09/12 and filled 11/29/11 #30 with 5 refills. Please advise     KP

## 2012-07-12 ENCOUNTER — Other Ambulatory Visit: Payer: 59

## 2012-07-12 ENCOUNTER — Telehealth: Payer: Self-pay

## 2012-07-12 MED ORDER — RANITIDINE HCL 300 MG PO TABS: 300.0000 mg | ORAL_TABLET | Freq: Every day | ORAL | Status: DC

## 2012-07-12 NOTE — Telephone Encounter (Signed)
Letter from [facility] with a medication reaction between Omeprazole and Citalopram and Dr.Lowne advises to make the patient aware and switch the Rx to Zantac 300 1 po qd #90 with 3 refills. I discussed with patient and he would like an RX sent to [facility] as well as a 90 days supply to [facility].  Rx sent and medication list updated as well as [facility] being made aware.       KP

## 2012-07-15 ENCOUNTER — Other Ambulatory Visit: Payer: Self-pay | Admitting: Family Medicine

## 2012-07-15 ENCOUNTER — Encounter: Payer: Self-pay | Admitting: Family Medicine

## 2012-07-15 DIAGNOSIS — G8929 Other chronic pain: Secondary | ICD-10-CM

## 2012-07-16 MED ORDER — OXYCODONE-ACETAMINOPHEN 5-325 MG PO TABS: 1.0000 | ORAL_TABLET | Freq: Three times a day (TID) | ORAL | Status: DC | PRN

## 2012-07-16 NOTE — Telephone Encounter (Signed)
Last seen and filled 07/09/12 # 20. Please advise        KP

## 2012-07-16 NOTE — Telephone Encounter (Signed)
See note

## 2012-07-19 ENCOUNTER — Other Ambulatory Visit: Payer: 59

## 2012-07-19 ENCOUNTER — Other Ambulatory Visit (INDEPENDENT_AMBULATORY_CARE_PROVIDER_SITE_OTHER): Payer: 59

## 2012-07-19 DIAGNOSIS — I2510 Atherosclerotic heart disease of native coronary artery without angina pectoris: Secondary | ICD-10-CM

## 2012-07-19 DIAGNOSIS — E119 Type 2 diabetes mellitus without complications: Secondary | ICD-10-CM

## 2012-07-20 ENCOUNTER — Other Ambulatory Visit: Payer: Self-pay | Admitting: Family Medicine

## 2012-07-20 DIAGNOSIS — R809 Proteinuria, unspecified: Secondary | ICD-10-CM

## 2012-07-23 ENCOUNTER — Telehealth: Payer: Self-pay | Admitting: Family Medicine

## 2012-07-23 MED ORDER — ANTARA 130 MG PO CAPS: 130.0000 mg | ORAL_CAPSULE | Freq: Every day | ORAL | Status: DC

## 2012-07-23 NOTE — Telephone Encounter (Signed)
Refill: Fenofibrate micro cap. Request sent from [facility]

## 2012-07-24 ENCOUNTER — Telehealth: Payer: Self-pay | Admitting: *Deleted

## 2012-07-24 NOTE — Telephone Encounter (Signed)
Faxed received stating that prior auth is not required at this time for meloxicam. The pharmacy may contact help  Desk at [phone] for further assistance processing claim. Pharmacy faxed approval letter scan to chart.

## 2012-07-29 ENCOUNTER — Encounter: Payer: Self-pay | Admitting: Family Medicine

## 2012-07-30 MED ORDER — TRAMADOL HCL 50 MG PO TABS: 50.0000 mg | ORAL_TABLET | Freq: Three times a day (TID) | ORAL | Status: DC | PRN

## 2012-08-01 ENCOUNTER — Encounter: Payer: Self-pay | Admitting: Family Medicine

## 2012-08-02 ENCOUNTER — Other Ambulatory Visit: Payer: Self-pay

## 2012-08-02 ENCOUNTER — Encounter: Payer: Self-pay | Admitting: Family Medicine

## 2012-08-02 NOTE — Telephone Encounter (Signed)
Error.     KP 

## 2012-08-06 ENCOUNTER — Other Ambulatory Visit: Payer: Self-pay | Admitting: Internal Medicine

## 2012-08-06 NOTE — Telephone Encounter (Signed)
No refills until appointment Fax Received. Refill Completed. Priscila Bean Chowoe (R.M.A)   

## 2012-08-09 ENCOUNTER — Encounter: Payer: Self-pay | Admitting: Family Medicine

## 2012-08-12 ENCOUNTER — Encounter: Payer: Self-pay | Admitting: Internal Medicine

## 2012-08-12 ENCOUNTER — Ambulatory Visit (INDEPENDENT_AMBULATORY_CARE_PROVIDER_SITE_OTHER): Payer: 59 | Admitting: Internal Medicine

## 2012-08-12 ENCOUNTER — Telehealth: Payer: Self-pay

## 2012-08-12 VITALS — BP 145/84 | HR 68 | Ht 70.0 in | Wt 245.0 lb

## 2012-08-12 DIAGNOSIS — E119 Type 2 diabetes mellitus without complications: Secondary | ICD-10-CM

## 2012-08-12 DIAGNOSIS — I10 Essential (primary) hypertension: Secondary | ICD-10-CM

## 2012-08-12 MED ORDER — FENOFIBRATE 160 MG PO TABS: 160.0000 mg | ORAL_TABLET | Freq: Every day | ORAL | Status: DC

## 2012-08-12 MED ORDER — METFORMIN HCL 1000 MG PO TABS: ORAL_TABLET | ORAL | Status: DC

## 2012-08-12 NOTE — Progress Notes (Addendum)
HPI Patient is a 57 yo with a istory of mild CAD by cath in 2007.  Also a history of HTN, DM , HL, sleep apnea.  I saw him in clinic in Oct 2012.  Myoview done after seen.  This was normal. He denies CP  Breathing is OK  Does notes some L shoulder pain that goes to back.  Not associated with physical activity. Admits to not eating as he should  Glucose is igh. Allergies  Allergen Reactions  . Sulfonamide Derivatives Hives    Current Outpatient Prescriptions  Medication Sig Dispense Refill  . albuterol (PROAIR HFA) 108 (90 BASE) MCG/ACT inhaler Inhale 2 puffs into the lungs 4 (four) times daily as needed for wheezing.  9 g  1  . amLODipine (NORVASC) 2.5 MG tablet Take 1 tablet (2.5 mg total) by mouth daily.  30 tablet  0  . carvedilol (COREG) 25 MG tablet Take 1 tablet (25 mg total) by mouth 2 (two) times daily.  180 tablet  0  . Chlorphen-Phenyleph-ASA (ALKA-SELTZER PLUS COLD PO) Take by mouth. If needed for a cold      . CIALIS 20 MG tablet As needed      . cimetidine (TAGAMET) 200 MG tablet Take 200 mg by mouth 4 (four) times daily.      . citalopram (CELEXA) 40 MG tablet Take 1 tablet (40 mg total) by mouth daily.  90 tablet  1  . cyclobenzaprine (FLEXERIL) 10 MG tablet Take 1 tablet (10 mg total) by mouth at bedtime as needed for muscle spasms.  90 tablet  0  . dextromethorphan-guaiFENesin (MUCINEX DM) 30-600 MG per 12 hr tablet Take 1 tablet by mouth every 12 (twelve) hours. ONLY IF NEEDED      . fenofibrate 160 MG tablet Take 1 tablet (160 mg total) by mouth daily.  90 tablet  3  . Flaxseed, Linseed, (FLAX SEED OIL) 1000 MG CAPS Take 1 capsule by mouth 2 (two) times daily.      . fluticasone (FLONASE) 50 MCG/ACT nasal spray Place 2 sprays into the nose daily. As needed      . gabapentin (NEURONTIN) 300 MG capsule Take 1 capsule (300 mg total) by mouth 3 (three) times daily.  90 capsule  11  . glucose blood (ONE TOUCH TEST STRIPS) test strip Check Blood sugars twice a day  100 each  12   . Insulin Pen Needle 29G X 12.7MM MISC As directed  100 each  3  . Insulin Pen Needle 32G X 6 MM MISC 1.8 mg by Does not apply route daily.  100 each  3  . Liraglutide 18 MG/3ML SOLN 1.8 mg Sq qd  18 mL  3  . lisinopril-hydrochlorothiazide (PRINZIDE,ZESTORETIC) 20-25 MG per tablet TAKE ONE TABLET BY MOUTH EVERY DAY  90 tablet  1  . loratadine (CLARITIN) 10 MG tablet 1 po qd prn  30 tablet  11  . metFORMIN (GLUCOPHAGE) 1000 MG tablet 1 po bid  180 tablet  3  . methylphenidate (RITALIN) 10 MG tablet Take 1 tablet (10 mg total) by mouth 3 (three) times daily.  270 tablet  0  . oxyCODONE-acetaminophen (PERCOCET/ROXICET) 5-325 MG per tablet Take 1 tablet by mouth every 4 (four) hours as needed.      . pravastatin (PRAVACHOL) 10 MG tablet Take 1 tablet (10 mg total) by mouth daily.  90 tablet  1  . pregabalin (LYRICA) 50 MG capsule Take 50 mg by mouth. Take at night      .  traMADol (ULTRAM) 50 MG tablet Take 1 tablet (50 mg total) by mouth 3 (three) times daily as needed for pain.  120 tablet  0  . meloxicam (MOBIC) 15 MG tablet Take 1 tablet (15 mg total) by mouth daily.  90 tablet  3    Past Medical History  Diagnosis Date  . Diabetes mellitus   . Hypertension   . Chronic kidney disease     Kidney cancer 1999  . Hyperlipidemia   . Sleep apnea   . Heart disease     Past Surgical History  Procedure Date  . Uvulopalatopharyngoplasty     08/1988  . Tonsillectomy     08/1988  . Cholecystectomy     2000  . Nephrectomy     L---1999 Cancer  . Nasal sinus surgery     2005  . Elbow surgery     left  . Inner ear surgery     R ear  . Ingrown toenail     Family History  Problem Relation Age of Onset  . Diabetes Father   . Hypertension Father   . Hyperlipidemia Father   . Colon polyps Father   . Hypertension Mother   . Diabetes Mother   . Colon cancer Neg Hx   . Stomach cancer Neg Hx     History   Social History  . Marital Status: Married    Spouse Name: N/A    Number of  Children: N/A  . Years of Education: N/A   Occupational History  . lab [employer]   Social History Main Topics  . Smoking status: Former Smoker -- 2.0 packs/day for 25 years    Types: Cigarettes    Quit date: 03/20/1998  . Smokeless tobacco: Never Used  . Alcohol Use: Yes     Comment: rare  . Drug Use: No  . Sexually Active: Yes -- Male, Male partner(s)     Comment: swinger   Other Topics Concern  . Not on file   Social History Narrative   Exercise--- walking at work 1 mile    Review of Systems:  All systems reviewed.  They are negative to the above problem except as previously stated.  Vital Signs: BP 145/84  Pulse 68  Ht 5\' 10"  (1.778 m)  Wt 245 lb (111.131 kg)  BMI 35.15 kg/m2  Physical Exam  HEENT:  Normocephalic, atraumatic. EOMI, PERRLA.  Neck: JVP is normal.  No bruits.  Lungs: clear to auscultation. No rales no wheezes.  Heart: Regular rate and rhythm. Normal S1, S2. No S3.   No significant murmurs. PMI not displaced.  Abdomen:  Supple, nontender. Normal bowel sounds. No masses. No hepatomegaly.  Extremities:   Good distal pulses throughout. No lower extremity edema.  Musculoskeletal :moving all extremities.  Neuro:   alert and oriented x3.  CN II-XII grossly intact.  EKG  SR 68 bpm. Assessment and Plan:  1.  CAD  I am not convinced symptoms reflect angina  Prob musculoskeletal  2.  HL  Keep on meds  witll need to review lipids  3.  HTN  Patient reports BP is higher at times 150s.  I would recomm increasing amlodipine to 5 mg per day  Will need to be followed.  4.  DM  Refer to dietary  5.  Obesity  Refer to dietary

## 2012-08-12 NOTE — Telephone Encounter (Signed)
Reviewed labs with patient from [facility]: Cholesterol is 204 and Triglycerides 679, T.chol/Hdl ratio is 8.5 and Dr.Lowne would like to switch the Antara to Fenofibrate 160 po qd and A1C is 7.1 he is to increase Metformin to 1000 mg BID. The patient voiced understanding and agreed to follow directions as well as work on improving his diet. He requested a copy of his labs be faxed to his secure fax line at [phone].      KP

## 2012-08-12 NOTE — Patient Instructions (Addendum)
You have been referred to the Diabetes and Nutrition department.  Your physician wants you to follow-up in: 6 months with Dr. Tenny Craw. You will receive a reminder letter in the mail two months in advance. If you don't receive a letter, please call our office to schedule the follow-up appointment.

## 2012-08-18 ENCOUNTER — Other Ambulatory Visit: Payer: Self-pay | Admitting: Family Medicine

## 2012-08-18 DIAGNOSIS — J302 Other seasonal allergic rhinitis: Secondary | ICD-10-CM

## 2012-08-19 NOTE — Telephone Encounter (Signed)
Refill for flonase sent to [facility] pharmacy in [place]

## 2012-08-27 ENCOUNTER — Other Ambulatory Visit: Payer: Self-pay | Admitting: Pain Medicine

## 2012-08-27 DIAGNOSIS — M542 Cervicalgia: Secondary | ICD-10-CM

## 2012-08-27 DIAGNOSIS — M545 Low back pain: Secondary | ICD-10-CM

## 2012-08-27 DIAGNOSIS — M549 Dorsalgia, unspecified: Secondary | ICD-10-CM

## 2012-08-29 ENCOUNTER — Encounter: Payer: Self-pay | Admitting: Family Medicine

## 2012-08-30 ENCOUNTER — Other Ambulatory Visit: Payer: Self-pay | Admitting: Internal Medicine

## 2012-08-31 ENCOUNTER — Other Ambulatory Visit: Payer: 59

## 2012-09-02 ENCOUNTER — Other Ambulatory Visit: Payer: Self-pay | Admitting: *Deleted

## 2012-09-02 MED ORDER — AMLODIPINE BESYLATE 5 MG PO TABS: 2.5000 mg | ORAL_TABLET | Freq: Two times a day (BID) | ORAL | Status: DC

## 2012-09-05 ENCOUNTER — Encounter: Payer: Self-pay | Admitting: Family Medicine

## 2012-09-06 ENCOUNTER — Telehealth: Payer: Self-pay

## 2012-09-06 ENCOUNTER — Encounter: Payer: Self-pay | Admitting: Family Medicine

## 2012-09-06 NOTE — Telephone Encounter (Signed)
Detailed msg left on the patient's voicemail to go to Sat clinic or UC to be seen if panic attacks worsen or follow up on Monday. I also left the msg for him not to send any urgent message by [employer]    KP

## 2012-09-06 NOTE — Telephone Encounter (Signed)
-----   Message from Lelon Perla, DO sent at 09/06/2012 5:09 PM -----   Remind him that urgent needs should not be sent via my chart--- he needs ov -----    Message from [name] to Lelon Perla, DO sent at 09/06/2012 11:53 AM -----   i have been having panic attacks the last two days. today it is so bad i have to go home and go to bed. my face stings and my neck muscles are tight. can i get something for panic attacks on a temporary basis to see if it helps.Marland Kitchen i did stop taking nuerotine and i am on lyrica 150 mg a day. this lyrica. glucose is 95 now.

## 2012-09-07 ENCOUNTER — Other Ambulatory Visit: Payer: 59

## 2012-09-23 ENCOUNTER — Ambulatory Visit: Payer: 59 | Admitting: Family Medicine

## 2012-09-23 ENCOUNTER — Telehealth: Payer: Self-pay | Admitting: Family Medicine

## 2012-09-23 NOTE — Telephone Encounter (Signed)
noted 

## 2012-09-23 NOTE — Telephone Encounter (Signed)
pt called to apologize for missing appt--pt notes he is seeing [facility] and did not see the need to come in since he is still seeing them--pt notes he willattempt to get MRI done this weekend at [facility]

## 2012-09-25 ENCOUNTER — Telehealth: Payer: Self-pay | Admitting: *Deleted

## 2012-09-25 NOTE — Telephone Encounter (Signed)
Verbal order needed for C-Pap supplies to be given to Pt. Please advise

## 2012-09-25 NOTE — Telephone Encounter (Signed)
Ok to give verbal 

## 2012-09-26 NOTE — Telephone Encounter (Signed)
Verbal ok given will fax over forms to sign.

## 2012-10-12 ENCOUNTER — Ambulatory Visit
Admission: RE | Admit: 2012-10-12 | Discharge: 2012-10-12 | Disposition: A | Discharge status: Home / Self Care | Payer: 59 | Source: Ambulatory Visit | Attending: Pain Medicine | Admitting: Pain Medicine

## 2012-10-12 DIAGNOSIS — M542 Cervicalgia: Secondary | ICD-10-CM

## 2012-10-12 DIAGNOSIS — M549 Dorsalgia, unspecified: Secondary | ICD-10-CM

## 2012-10-14 ENCOUNTER — Telehealth: Payer: Self-pay | Admitting: Internal Medicine

## 2012-10-14 ENCOUNTER — Other Ambulatory Visit: Payer: Self-pay | Admitting: Family Medicine

## 2012-10-14 NOTE — Telephone Encounter (Signed)
**Note De-Identified Dore Oquin Obfuscation** Cannot reach pts son, Mellody Dance, at number provided (sounds like fax machine).

## 2012-10-14 NOTE — Telephone Encounter (Signed)
New Prob   Has some questions about long QT syndrome diagnoses. Would like to speak to nurse.

## 2012-10-15 NOTE — Telephone Encounter (Signed)
Refill: Cyclobenzaprine Citalopram Pravastatin

## 2012-10-16 ENCOUNTER — Encounter: Payer: Self-pay | Admitting: *Deleted

## 2012-10-16 ENCOUNTER — Other Ambulatory Visit: Payer: Self-pay | Admitting: Family Medicine

## 2012-10-16 DIAGNOSIS — F988 Other specified behavioral and emotional disorders with onset usually occurring in childhood and adolescence: Secondary | ICD-10-CM

## 2012-10-16 MED ORDER — CITALOPRAM HYDROBROMIDE 40 MG PO TABS: 40.0000 mg | ORAL_TABLET | Freq: Every day | ORAL | Status: DC

## 2012-10-16 MED ORDER — METHYLPHENIDATE HCL 10 MG PO TABS: 10.0000 mg | ORAL_TABLET | Freq: Three times a day (TID) | ORAL | Status: DC

## 2012-10-16 MED ORDER — PRAVASTATIN SODIUM 10 MG PO TABS: 10.0000 mg | ORAL_TABLET | Freq: Every day | ORAL | Status: DC

## 2012-10-16 MED ORDER — CYCLOBENZAPRINE HCL 10 MG PO TABS: 10.0000 mg | ORAL_TABLET | Freq: Every evening | ORAL | Status: DC | PRN

## 2012-10-16 NOTE — Telephone Encounter (Signed)
Numerous attempts made to reach patient via telephone.  Provided number is a fax machine.

## 2012-10-24 ENCOUNTER — Other Ambulatory Visit: Payer: Self-pay | Admitting: Internal Medicine

## 2012-10-25 NOTE — Telephone Encounter (Signed)
Fax Received. Refill Completed. Jesse Vargas (R.M.A)   

## 2012-11-19 ENCOUNTER — Telehealth: Payer: Self-pay | Admitting: Family Medicine

## 2012-11-19 NOTE — Telephone Encounter (Signed)
That is fine 

## 2012-11-19 NOTE — Telephone Encounter (Signed)
Is it ok for this patient to switch from Dr. Laury Axon to Dr. Lorin Picket. He wants to see one of the physicians in this area because it is closer .

## 2012-12-14 IMAGING — US US SOFT TISSUE HEAD/NECK
1 series · 14 of 25 positions shown · non-contrast
Comparison: None.

CLINICAL DATA: Localized swelling/lump in neck.

THYROID ULTRASOUND
TECHNIQUE: Ultrasound examination of the thyroid gland and adjacent
soft tissues was performed.

[Series 1: us soft tissue head/neck · 0.10mm/px · 14 of 51 slices shown]
[im 1/51]
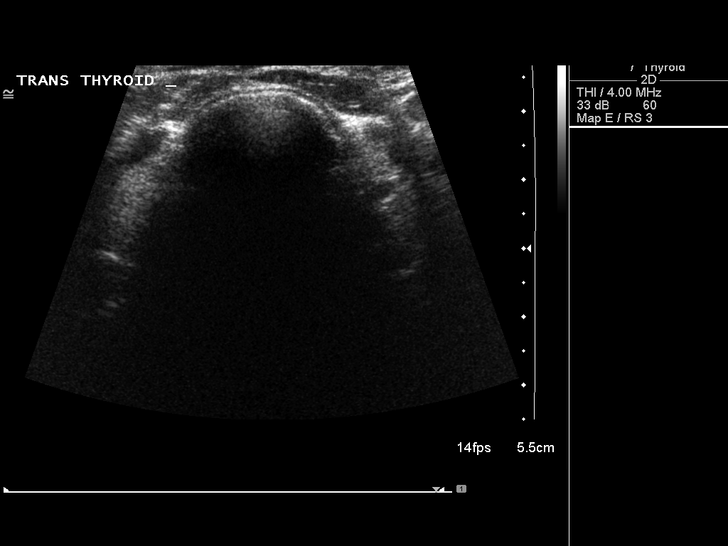
[im 5/51]
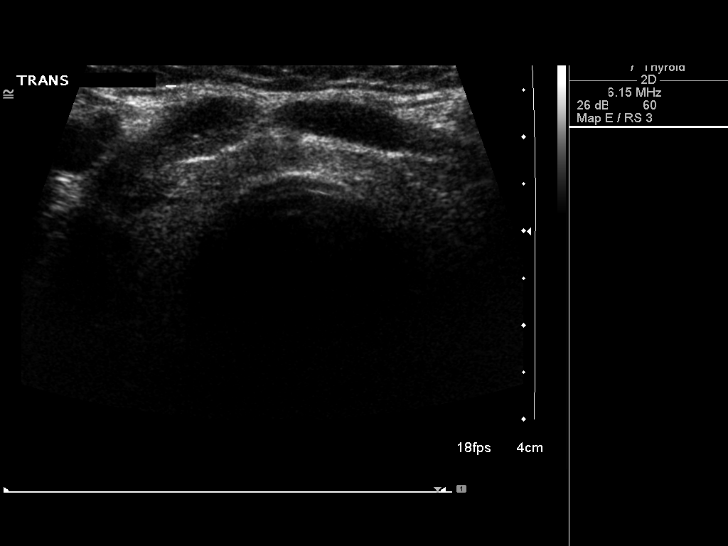
[im 9/51]
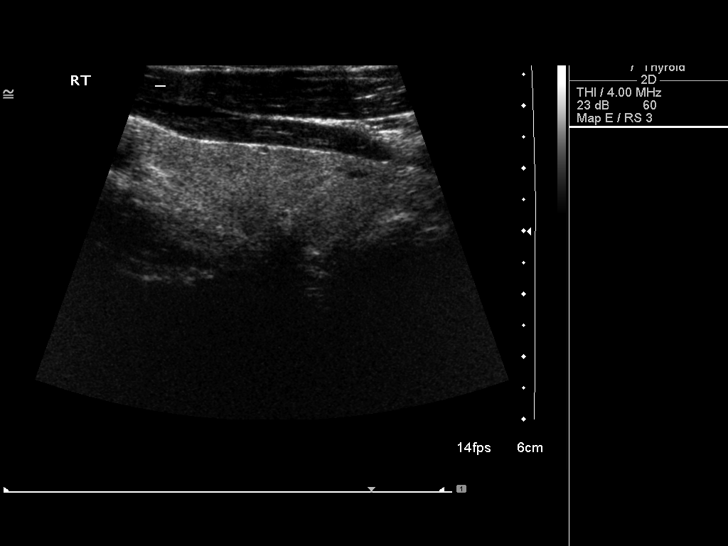
[im 13/51]
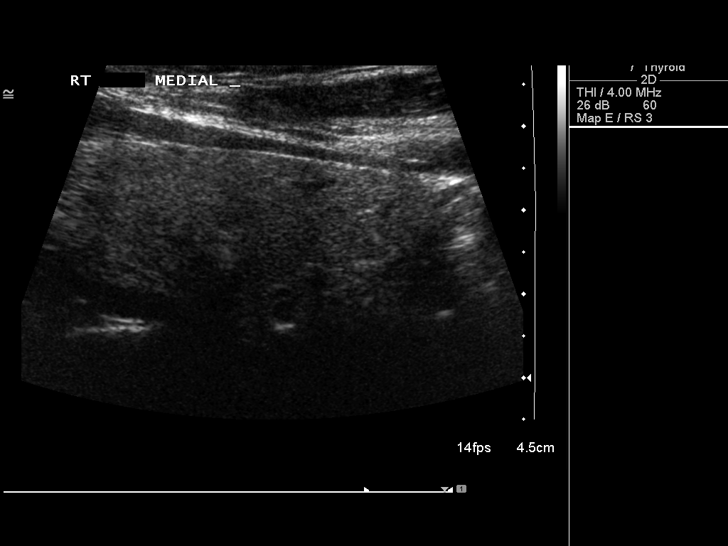
[im 17/51]
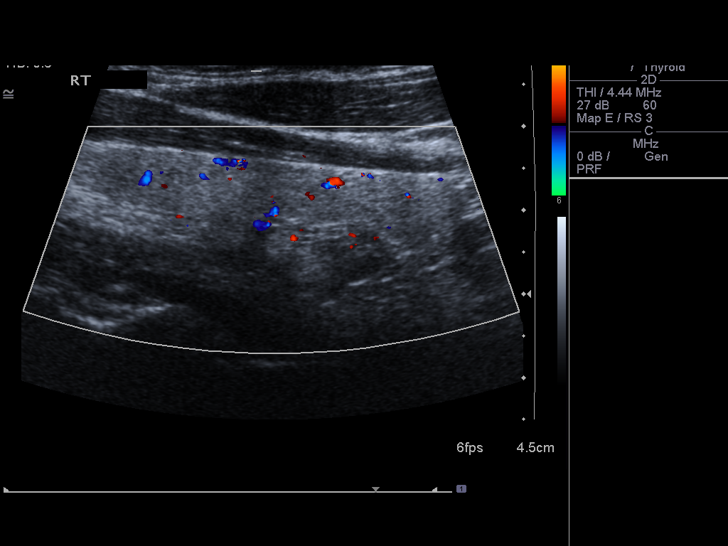
[im 19/51]
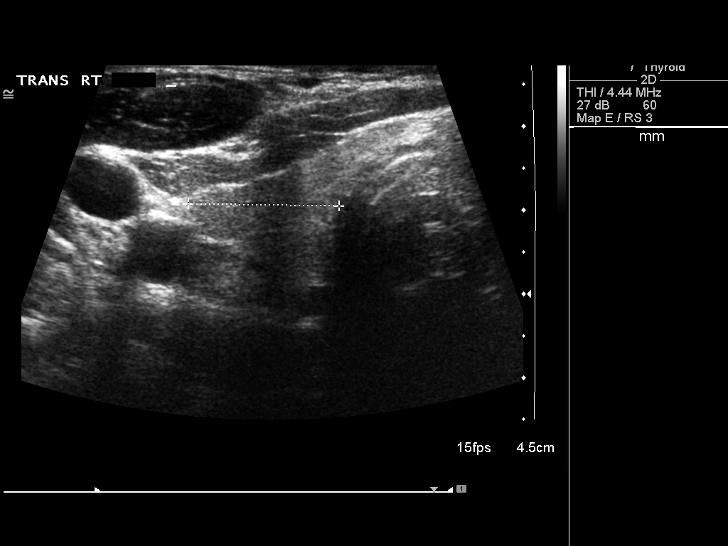
[im 23/51]
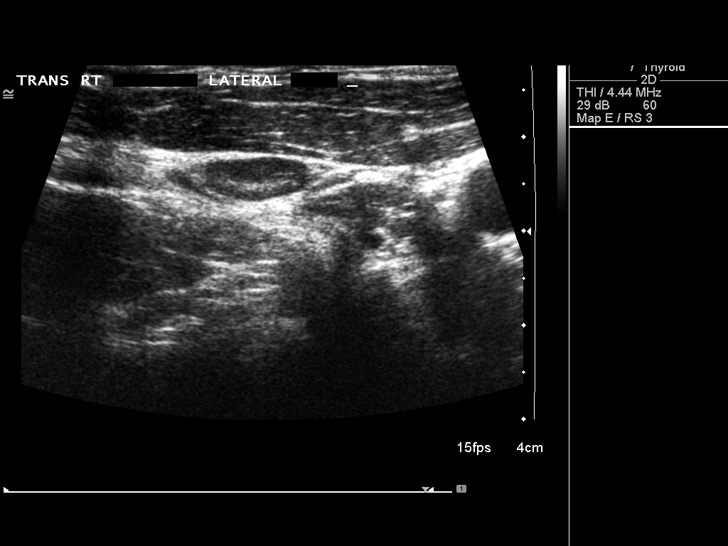
[im 28/51]
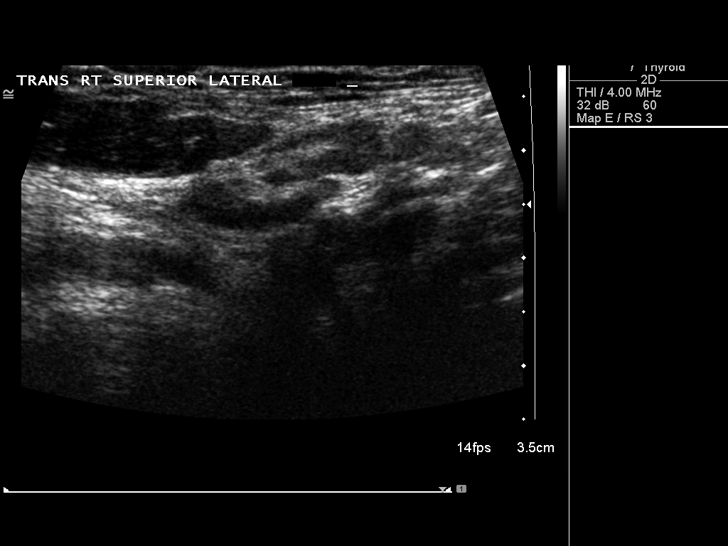
[im 32/51]
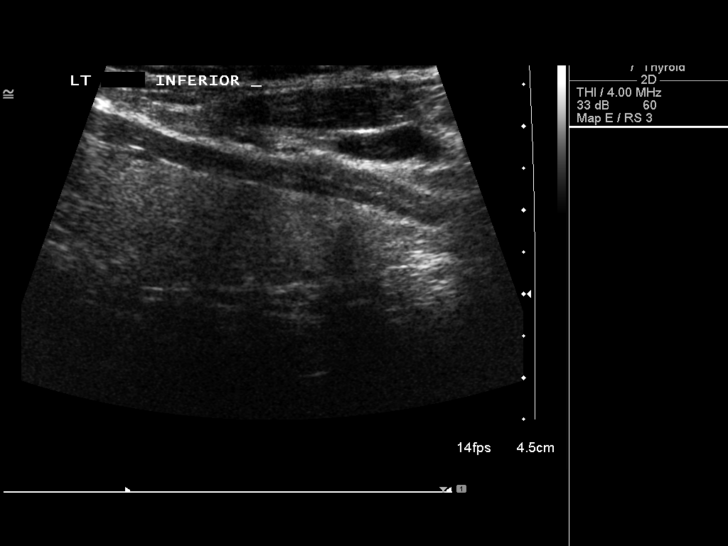
[im 34/51]
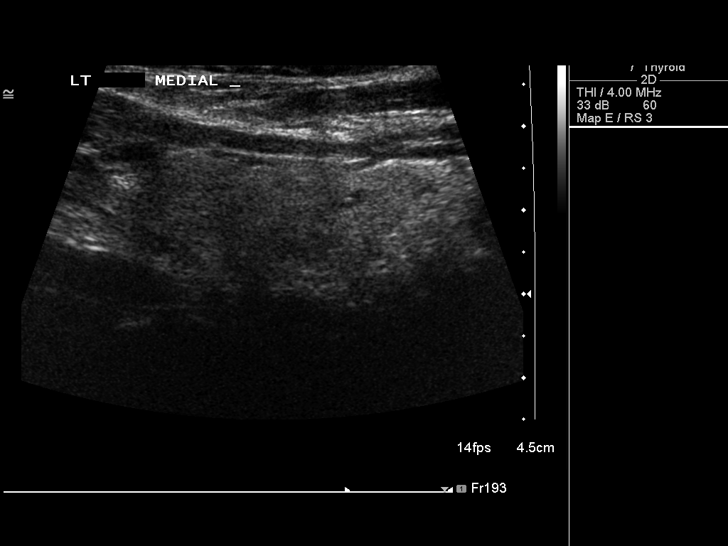
[im 38/51]
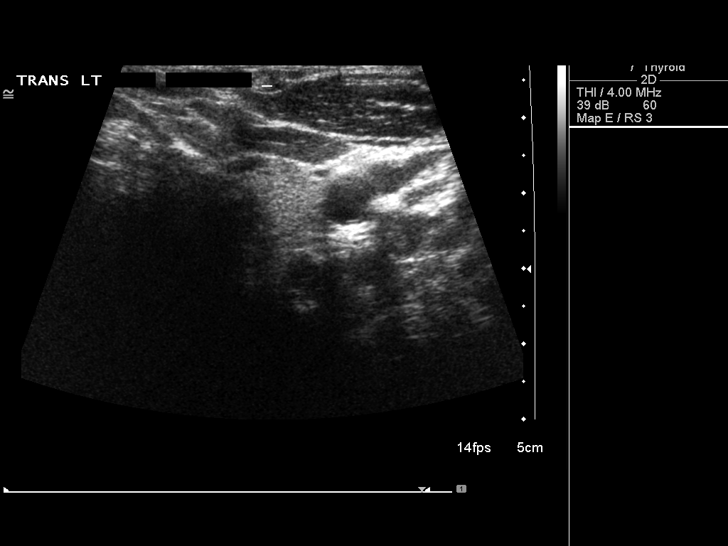
[im 42/51]
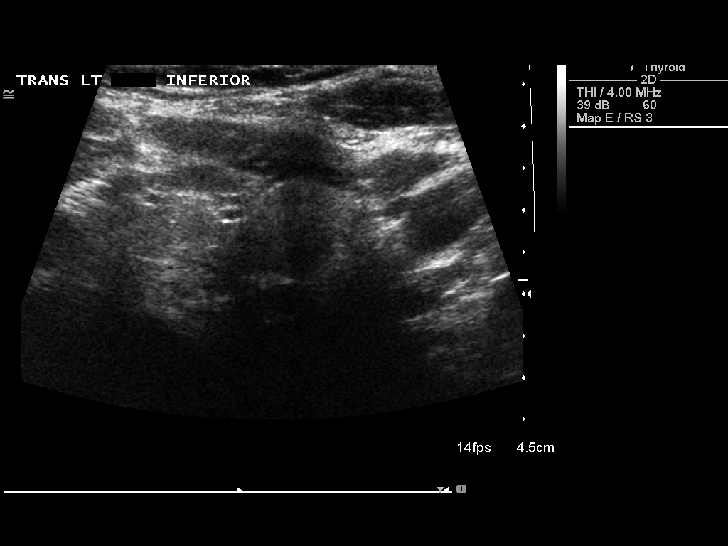
[im 46/51]
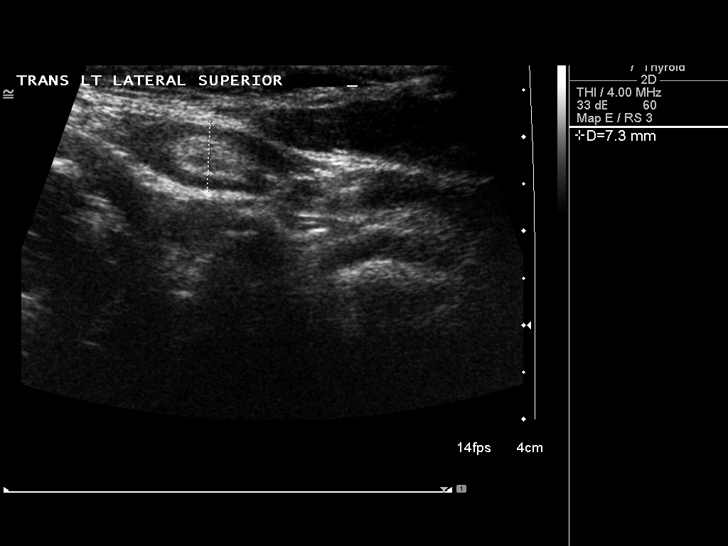
[im 51/51]
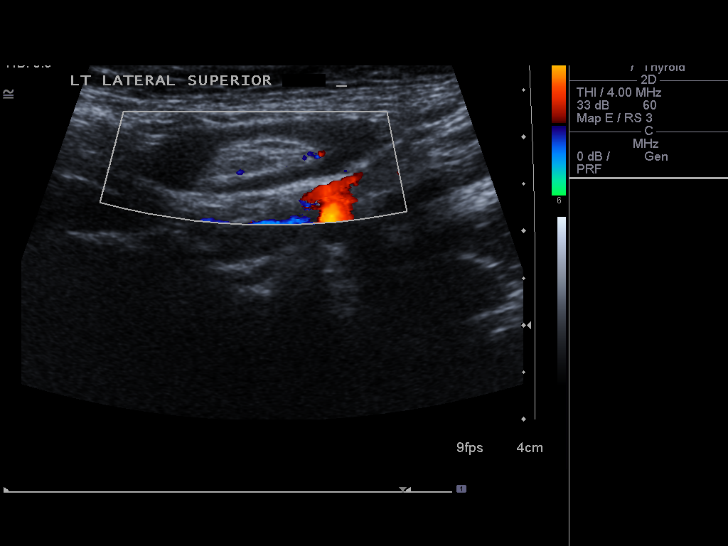

[14 of 25 positions shown; findings below may reference images not displayed]

FINDINGS: Right thyroid lobe:  5.0 x 1.7 x 1.8 cm.
Left thyroid lobe:  5.0 x 1.7 x 1.6 cm.
Isthmus:  3 mm in thickness.

Focal nodules:  The gland is fairly homogeneous in echotexture.
There are small nodules bilaterally.  The largest nodule is
positioned posteriorly on the right and is echogenic in nature.  It
measures 10 x 6 x 8 mm.  There is a tiny hypoechoic nodule on the
left lobe measuring 4 mm maximally.

Lymphadenopathy:  Small lymph nodes in the lower neck are not
pathologically enlarged, measuring 9 mm maximally on the right.
These do not correspond specifically with the patient's palpable
concern.
IMPRESSION: 1.  There is no specific correlate to the patient's palpable
concern.  Nonenlarged lymph nodes are present in the lower neck.
Clinical follow-up of any palpable concern is recommended.
2.  Small thyroid nodules bilaterally are nonspecific and do not
meet size criteria for biopsy.  Ultrasound followup in 1 year is
suggested to assess stability.

## 2013-01-11 ENCOUNTER — Other Ambulatory Visit: Payer: Self-pay | Admitting: Family Medicine

## 2013-01-16 ENCOUNTER — Encounter: Payer: Self-pay | Admitting: Family Medicine

## 2013-01-16 ENCOUNTER — Other Ambulatory Visit: Payer: Self-pay | Admitting: Family Medicine

## 2013-01-16 ENCOUNTER — Other Ambulatory Visit: Payer: Self-pay

## 2013-01-16 MED ORDER — LISINOPRIL-HYDROCHLOROTHIAZIDE 20-25 MG PO TABS: ORAL_TABLET | ORAL | Status: DC

## 2013-01-16 NOTE — Telephone Encounter (Signed)
Patient has switched providers.     KP

## 2013-01-30 ENCOUNTER — Other Ambulatory Visit: Payer: Self-pay | Admitting: Family Medicine

## 2013-02-04 ENCOUNTER — Encounter: Payer: Self-pay | Admitting: Family Medicine

## 2013-02-12 ENCOUNTER — Other Ambulatory Visit: Payer: Self-pay

## 2013-02-25 ENCOUNTER — Other Ambulatory Visit: Payer: Self-pay | Admitting: Family Medicine

## 2013-02-26 NOTE — Telephone Encounter (Signed)
Last seen 07/09/12 and filled 10/16/12 #90. Please advise      KP

## 2013-03-07 ENCOUNTER — Encounter: Payer: Self-pay | Admitting: Internal Medicine

## 2013-03-07 ENCOUNTER — Ambulatory Visit (INDEPENDENT_AMBULATORY_CARE_PROVIDER_SITE_OTHER): Payer: 59 | Admitting: Internal Medicine

## 2013-03-07 VITALS — BP 129/82 | HR 63 | Ht 70.0 in | Wt 244.6 lb

## 2013-03-07 DIAGNOSIS — R079 Chest pain, unspecified: Secondary | ICD-10-CM

## 2013-03-07 DIAGNOSIS — Z8249 Family history of ischemic heart disease and other diseases of the circulatory system: Secondary | ICD-10-CM

## 2013-03-07 DIAGNOSIS — G4730 Sleep apnea, unspecified: Secondary | ICD-10-CM

## 2013-03-07 DIAGNOSIS — I471 Supraventricular tachycardia: Secondary | ICD-10-CM

## 2013-03-07 DIAGNOSIS — I498 Other specified cardiac arrhythmias: Secondary | ICD-10-CM

## 2013-03-07 DIAGNOSIS — I2510 Atherosclerotic heart disease of native coronary artery without angina pectoris: Secondary | ICD-10-CM

## 2013-03-07 NOTE — Patient Instructions (Addendum)
The current medical regimen is effective;  continue present plan and medications.  Follow up as needed 

## 2013-03-07 NOTE — Progress Notes (Signed)
Patient Care Team: Lelon Perla, DO as PCP - General   HPI  Jesse Vargas is a 57 y.o. male Seen for evaluation of long QT syndrome. His sister and his mother have ECG evidence of long QT, the latter with polymorphic ventricular tachycardia. Gene testing was negative.  The patient has a history of tachypalpitations. There are abrupt in onset and offset and lasted hours. They're associated with diaphoresis lightheadedness or chest discomfort and shortness of breath. They're frog negative and diuretic negative. They're precipitated by exertion  He has a history of mild nonobstructive coronary disease by cath 2007 and Myoview October 2012 which is normal.  Past Medical History  Diagnosis Date  . Diabetes mellitus   . Hypertension   . Chronic kidney disease     Kidney cancer 1999  . Hyperlipidemia   . Sleep apnea   . Heart disease     Past Surgical History  Procedure Laterality Date  . Uvulopalatopharyngoplasty      08/1988  . Tonsillectomy      08/1988  . Cholecystectomy      2000  . Nephrectomy      L---1999 Cancer  . Nasal sinus surgery      2005  . Elbow surgery      left  . Inner ear surgery      R ear  . Ingrown toenail      Current Outpatient Prescriptions  Medication Sig Dispense Refill  . albuterol (PROAIR HFA) 108 (90 BASE) MCG/ACT inhaler Inhale 2 puffs into the lungs 4 (four) times daily as needed for wheezing.  9 g  1  . amLODipine (NORVASC) 5 MG tablet Take 0.5 tablets (2.5 mg total) by mouth 2 (two) times daily.  90 tablet  3  . carvedilol (COREG) 25 MG tablet Take 1 tablet by mouth  twice a day  180 tablet  3  . Chlorphen-Phenyleph-ASA (ALKA-SELTZER PLUS COLD PO) Take by mouth. If needed for a cold      . CIALIS 20 MG tablet As needed      . cimetidine (TAGAMET) 200 MG tablet Take 200 mg by mouth 4 (four) times daily.      . citalopram (CELEXA) 40 MG tablet Take 1 tablet (40 mg total) by mouth daily.  90 tablet  1  . cyclobenzaprine (FLEXERIL) 10 MG  tablet Take 1 tablet by mouth at  bedtime as needed for  muscle spasm(s)  90 tablet  0  . dextromethorphan-guaiFENesin (MUCINEX DM) 30-600 MG per 12 hr tablet Take 1 tablet by mouth every 12 (twelve) hours. ONLY IF NEEDED      . fenofibrate 160 MG tablet Take 1 tablet (160 mg total) by mouth daily.  90 tablet  3  . fluticasone (FLONASE) 50 MCG/ACT nasal spray USE TWO SPRAYS IN EACH NOSTRIL EVERY DAY  48 g  3  . fluticasone (FLONASE) 50 MCG/ACT nasal spray Use 2 sprays nasally daily  48 g  1  . gabapentin (NEURONTIN) 300 MG capsule Take 1 capsule (300 mg total) by mouth 3 (three) times daily.  90 capsule  11  . glucose blood (ONE TOUCH TEST STRIPS) test strip Check Blood sugars twice a day  100 each  12  . lisinopril-hydrochlorothiazide (PRINZIDE,ZESTORETIC) 20-25 MG per tablet TAKE ONE TABLET BY MOUTH EVERY DAY  90 tablet  0  . loratadine (CLARITIN) 10 MG tablet 1 po qd prn  30 tablet  11  . meloxicam (MOBIC) 15 MG tablet Take 1 tablet (  15 mg total) by mouth daily.  90 tablet  3  . metFORMIN (GLUCOPHAGE) 1000 MG tablet 1 po bid  180 tablet  3  . methylphenidate (RITALIN) 10 MG tablet Take 1 tablet (10 mg total) by mouth 3 (three) times daily.  270 tablet  0  . oxyCODONE-acetaminophen (PERCOCET/ROXICET) 5-325 MG per tablet Take 1 tablet by mouth every 4 (four) hours as needed.      . pravastatin (PRAVACHOL) 10 MG tablet 1 tab by mouth daily--repeat labs are due now  90 tablet  0  . pregabalin (LYRICA) 50 MG capsule Take 50 mg by mouth. Take at night      . traMADol (ULTRAM) 50 MG tablet Take 1 tablet (50 mg total) by mouth 3 (three) times daily as needed for pain.  120 tablet  0   No current facility-administered medications for this visit.    Allergies  Allergen Reactions  . Sulfonamide Derivatives Hives    Review of Systems negative except from HPI and PMH  Physical Exam BP 129/82  Pulse 63  Ht 5\' 10"  (1.778 m)  Wt 244 lb 9.6 oz (110.95 kg)  BMI 35.1 kg/m2 Alert and oriented in no  acute distress morbid obesity HENT- normal Eyes- EOMI, without scleral icterus Skin- warm and dry; without rashes LN-neg Neck- supple without thyromegaly, JVP-flat, carotids brisk and full without bruits Back-without CVAT or kyphosis Lungs-clear to auscultation CV-Regular rate and rhythm, nl S1 and S2, no murmurs gallops or rubs, S4-absent Abd-soft with active bowel sounds; no midline pulsation or hepatomegaly midline hernia Pulses-intact femoral and distal MKS-without gross deformity Neuro- Ax O, CN3-12 intact, grossly normal motor and sensory function Affect engaging  ECG 63  15/09/42  Assessment and  Plan

## 2013-03-10 ENCOUNTER — Other Ambulatory Visit: Payer: Self-pay | Admitting: Family Medicine

## 2013-03-10 DIAGNOSIS — F988 Other specified behavioral and emotional disorders with onset usually occurring in childhood and adolescence: Secondary | ICD-10-CM

## 2013-03-11 NOTE — Telephone Encounter (Signed)
patient is 3 mos Overdue for an Ov requesting a 90 day supply of Ritalin. Please advise      KP

## 2013-03-11 NOTE — Telephone Encounter (Signed)
Needs ov

## 2013-03-11 NOTE — Telephone Encounter (Signed)
patient is 3 mos Overdue for an Ov requesting a 90 day supply of Ritalin. Please advise      KP 

## 2013-03-18 ENCOUNTER — Telehealth: Payer: Self-pay | Admitting: Internal Medicine

## 2013-03-18 NOTE — Telephone Encounter (Signed)
New Problem  Pt states he has is records// will drop off 9/10

## 2013-03-20 ENCOUNTER — Telehealth: Payer: Self-pay | Admitting: General Practice

## 2013-03-20 NOTE — Telephone Encounter (Signed)
Pt needs ov. 

## 2013-03-20 NOTE — Telephone Encounter (Signed)
methylphenidate (RITALIN) 10 MG tablet Last OV 07-09-12 Med last filled 10-16-12 #270  Moderate Risk  Screening Overdue

## 2013-03-20 NOTE — Telephone Encounter (Signed)
Wife has been made aware.      KP 

## 2013-03-21 ENCOUNTER — Ambulatory Visit (INDEPENDENT_AMBULATORY_CARE_PROVIDER_SITE_OTHER): Payer: 59 | Admitting: Family Medicine

## 2013-03-21 ENCOUNTER — Encounter: Payer: Self-pay | Admitting: Family Medicine

## 2013-03-21 VITALS — BP 138/84 | HR 65 | Temp 98.2°F | Wt 245.0 lb

## 2013-03-21 DIAGNOSIS — E785 Hyperlipidemia, unspecified: Secondary | ICD-10-CM

## 2013-03-21 DIAGNOSIS — F988 Other specified behavioral and emotional disorders with onset usually occurring in childhood and adolescence: Secondary | ICD-10-CM

## 2013-03-21 DIAGNOSIS — I10 Essential (primary) hypertension: Secondary | ICD-10-CM

## 2013-03-21 DIAGNOSIS — E669 Obesity, unspecified: Secondary | ICD-10-CM | POA: Insufficient documentation

## 2013-03-21 DIAGNOSIS — L600 Ingrowing nail: Secondary | ICD-10-CM

## 2013-03-21 DIAGNOSIS — E119 Type 2 diabetes mellitus without complications: Secondary | ICD-10-CM

## 2013-03-21 DIAGNOSIS — E1159 Type 2 diabetes mellitus with other circulatory complications: Secondary | ICD-10-CM

## 2013-03-21 MED ORDER — CEPHALEXIN 500 MG PO CAPS: 500.0000 mg | ORAL_CAPSULE | Freq: Two times a day (BID) | ORAL | Status: DC

## 2013-03-21 MED ORDER — METHYLPHENIDATE HCL 10 MG PO TABS: 10.0000 mg | ORAL_TABLET | Freq: Three times a day (TID) | ORAL | Status: DC

## 2013-03-21 NOTE — Patient Instructions (Addendum)
Diabetes and Standards of Medical Care  Diabetes is complicated. You may find that your diabetes team includes a dietitian, nurse, diabetes educator, eye doctor, and more. To help everyone know what is going on and to help you get the care you deserve, the following schedule of care was developed to help keep you on track. Below are the tests, exams, vaccines, medicines, education, and plans you will need. A1c test  Performed at least 2 times a year if you are meeting treatment goals.  Performed 4 times a year if therapy has changed or if you are not meeting treatment goals. Blood pressure test  Performed at every routine medical visit. The goal is less than 120/80 mmHg. Dental exam  Follow up with the dentist regularly. Eye exam  Diagnosed with type 1 diabetes as a child: Get an exam upon reaching the age of 10 years or older and having had diabetes for 3 5 years. Yearly eye exams are recommended after that initial eye exam.  Diagnosed with type 1 diabetes as an adult: Get an exam within 5 years of diagnosis and then yearly.  Diagnosed with type 2 diabetes: Get an exam as soon as possible after the diagnosis and then yearly. Foot care exam  Visual foot exams are performed at every routine medical visit. The exams check for cuts, injuries, or other problems with the feet.  A comprehensive foot exam should be done yearly. This includes visual inspection as well as assessing foot pulses and testing for loss of sensation. Kidney function test (urine microalbumin)  Performed once a year.  Type 1 diabetes: The first test is performed 5 years after diagnosis.  Type 2 diabetes: The first test is performed at the time of diagnosis.  A serum creatinine and estimated glomerular filtration rate (eGFR) test is done once a year to tell the level of chronic kidney disease (CKD), if present. Lipid profile (Cholesterol, HDL, LDL, Triglycerides)  Performed every 5 years for most people.  The  goal for LDL is less than 100 mg/dl. If at high risk, the goal is less than 70 mg/dl.  The goal for HDL is 40 mg/dl 50 mg/dl for men and 50 mg/dl 60 mg/dl for women. An HDL cholesterol of 60 mg/dL or higher gives some protection against heart disease.  The goal for triglycerides is less than 150 mg/dl. Influenza vaccine, pneumococcal vaccine, and hepatitis B vaccine  The influenza vaccine is recommended yearly.  The pneumococcal vaccine is generally given once in a lifetime. However, there are some instances when another vaccination is recommended. Check with your caregiver.  The hepatitis B vaccine is also recommended for adults with diabetes. Diabetes self-management education  Recommended at diagnosis and ongoing as needed. Treatment plan  Reviewed at every medical visit. Document Released: 04/23/2009 Document Revised: 06/12/2012 Document Reviewed: 12/27/2010 Exit[employer] Patient Information 2014 [employer].  

## 2013-03-21 NOTE — Assessment & Plan Note (Signed)
Check labs 

## 2013-03-21 NOTE — Assessment & Plan Note (Signed)
Med refilled rto 6 months

## 2013-03-21 NOTE — Assessment & Plan Note (Signed)
Antibiotics per orders Refer to podiatry if it does not improve--- pt wanted to wait on referral

## 2013-03-21 NOTE — Progress Notes (Signed)
  Subjective:    Patient ID: Jesse Vargas, male    DOB: 1955-11-05, 57 y.o.   MRN: [id number]  HPI Pt here f/u add.  He is doing well with meds and is happy with his job at [employer] in [place].  He will be switching to the [facility] office because it is closer to work for him.  HYPERTENSION Disease Monitoring Blood pressure range-not checking Chest pain- no      Dyspnea- no Medications Compliance- good Lightheadedness- no   Edema- no   DIABETES Disease Monitoring Blood Sugar ranges- good per pt Polyuria- no New Visual problems- no Medications Compliance- good Hypoglycemic symptoms- no   HYPERLIPIDEMIA Disease Monitoring See symptoms for Hypertension Medications Compliance- good RUQ pain- no  Muscle aches- no  ROS See HPI above   PMH Smoking Status noted       Review of Systems    as above Objective:   Physical Exam BP 138/84  Pulse 65  Temp(Src) 98.2 F (36.8 C) (Oral)  Wt 245 lb (111.131 kg)  BMI 35.15 kg/m2  SpO2 95% General appearance: alert, cooperative, appears stated age and no distress Head: Normocephalic, without obvious abnormality, atraumatic Eyes: negative findings: lids and lashes normal, conjunctivae and sclerae normal and pupils equal, round, reactive to light and accomodation Ears: normal TM's and external ear canals both ears Nose: Nares normal. Septum midline. Mucosa normal. No drainage or sinus tenderness. Throat: lips, mucosa, and tongue normal; teeth and gums normal Neck: no adenopathy, supple, symmetrical, trachea midline and thyroid not enlarged, symmetric, no tenderness/mass/nodules Back: symmetric, no curvature. ROM normal. No CVA tenderness. Lungs: clear to auscultation bilaterally Heart: S1, S2 normal Extremities: extremities normal, atraumatic, no cyanosis or edema-- + ingrown toenail R big toe Sensory exam of the foot is normal, tested with the monofilament. Good pulses, no lesions or ulcers, good peripheral pulses.          Assessment & Plan:

## 2013-03-21 NOTE — Assessment & Plan Note (Signed)
Check labs con't meds 

## 2013-03-21 NOTE — Assessment & Plan Note (Signed)
Stable con't meds 

## 2013-03-26 ENCOUNTER — Telehealth: Payer: Self-pay | Admitting: Internal Medicine

## 2013-03-26 NOTE — Telephone Encounter (Signed)
Pt's Wife Dropped off Records For Her Husband gave to Scheduling Dept 03/26/13/KM

## 2013-04-03 ENCOUNTER — Ambulatory Visit: Payer: 59 | Admitting: Internal Medicine

## 2013-04-10 ENCOUNTER — Encounter: Payer: Self-pay | Admitting: Family Medicine

## 2013-04-10 ENCOUNTER — Ambulatory Visit (INDEPENDENT_AMBULATORY_CARE_PROVIDER_SITE_OTHER): Payer: 59 | Admitting: Family Medicine

## 2013-04-10 VITALS — BP 160/95 | HR 80 | Temp 98.0°F | Wt 245.0 lb

## 2013-04-10 DIAGNOSIS — I2510 Atherosclerotic heart disease of native coronary artery without angina pectoris: Secondary | ICD-10-CM

## 2013-04-10 DIAGNOSIS — E785 Hyperlipidemia, unspecified: Secondary | ICD-10-CM

## 2013-04-10 DIAGNOSIS — E119 Type 2 diabetes mellitus without complications: Secondary | ICD-10-CM

## 2013-04-10 DIAGNOSIS — R5381 Other malaise: Secondary | ICD-10-CM

## 2013-04-10 DIAGNOSIS — J309 Allergic rhinitis, unspecified: Secondary | ICD-10-CM

## 2013-04-10 DIAGNOSIS — I10 Essential (primary) hypertension: Secondary | ICD-10-CM

## 2013-04-10 DIAGNOSIS — E1159 Type 2 diabetes mellitus with other circulatory complications: Secondary | ICD-10-CM

## 2013-04-10 DIAGNOSIS — J302 Other seasonal allergic rhinitis: Secondary | ICD-10-CM

## 2013-04-10 DIAGNOSIS — G4733 Obstructive sleep apnea (adult) (pediatric): Secondary | ICD-10-CM

## 2013-04-10 DIAGNOSIS — N529 Male erectile dysfunction, unspecified: Secondary | ICD-10-CM

## 2013-04-10 MED ORDER — LISINOPRIL-HYDROCHLOROTHIAZIDE 20-25 MG PO TABS: ORAL_TABLET | ORAL | Status: DC

## 2013-04-10 MED ORDER — TADALAFIL 5 MG PO TABS: ORAL_TABLET | ORAL | Status: DC

## 2013-04-10 MED ORDER — FLUTICASONE PROPIONATE 50 MCG/ACT NA SUSP: NASAL | Status: DC

## 2013-04-10 NOTE — Assessment & Plan Note (Signed)
Check labs 

## 2013-04-10 NOTE — Assessment & Plan Note (Signed)
Stable con't meds 

## 2013-04-10 NOTE — Progress Notes (Signed)
  Subjective:     Jesse Vargas is a 57 y.o. male who presents for evaluation of fatigue. Symptoms began several weeks ago. The patient feels the fatigue began with: a witnessed or suspected sleep apnea. Symptoms of his fatigue have been decreased libido, fatigue with paradoxical insomnia and general malaise. Patient describes the following psychological symptoms: none. Patient denies constipation, fever, significant change in weight, symptoms of arthritis and unusual rashes. Symptoms have waxed and waned. Symptom severity: symptoms bothersome, but easily able to carry out all usual work/school/family activities. Previous visits for this problem: none.   The following portions of the patient's history were reviewed and updated as appropriate: allergies, current medications, past family history, past medical history, past social history, past surgical history and problem list.  Review of Systems Pertinent items are noted in HPI.    Objective:    BP 160/95  Pulse 80  Temp(Src) 98 F (36.7 C) (Oral)  Wt 245 lb (111.131 kg)  BMI 35.15 kg/m2  SpO2 96% General appearance: alert, cooperative, appears stated age and no distress Head: Normocephalic, without obvious abnormality, atraumatic Neck: no adenopathy, no carotid bruit, no JVD, supple, symmetrical, trachea midline and thyroid not enlarged, symmetric, no tenderness/mass/nodules Lungs: clear to auscultation bilaterally Heart: S1, S2 normal    Assessment:    fatigue    Plan:    Discussed diagnosis with patient. See orders for lab evaluation. sleep study to reevaluate sleep apnea

## 2013-04-10 NOTE — Assessment & Plan Note (Signed)
Check labs Cont' meds 

## 2013-04-10 NOTE — Assessment & Plan Note (Signed)
Check labs con't meds 

## 2013-04-10 NOTE — Assessment & Plan Note (Signed)
Sleep studay

## 2013-04-10 NOTE — Assessment & Plan Note (Signed)
cialis 5 mg daily Check labs

## 2013-04-11 LAB — VITAMIN B12: Vitamin B-12: 349 pg/mL (ref 211–911)

## 2013-04-11 LAB — TESTOSTERONE, FREE, TOTAL, SHBG
Sex Hormone Binding: 17 nmol/L (ref 13–71)
Testosterone, Free: 43.7 pg/mL — ABNORMAL LOW (ref 47.0–244.0)
Testosterone-% Free: 2.6 % (ref 1.6–2.9)
Testosterone: 167 ng/dL — ABNORMAL LOW (ref 300–890)

## 2013-04-24 ENCOUNTER — Telehealth: Payer: Self-pay | Admitting: Internal Medicine

## 2013-04-24 NOTE — Telephone Encounter (Signed)
New problem    Patient is calling checking on status of upcoming procedure . Records drop off about a month ago.

## 2013-04-28 ENCOUNTER — Ambulatory Visit: Payer: 59 | Admitting: Family Medicine

## 2013-05-09 ENCOUNTER — Encounter: Payer: Self-pay | Admitting: Family Medicine

## 2013-05-09 MED ORDER — ESOMEPRAZOLE MAGNESIUM 20 MG PO CPDR: 20.0000 mg | DELAYED_RELEASE_CAPSULE | Freq: Two times a day (BID) | ORAL | Status: DC

## 2013-05-12 ENCOUNTER — Other Ambulatory Visit: Payer: Self-pay | Admitting: Family Medicine

## 2013-05-12 DIAGNOSIS — K219 Gastro-esophageal reflux disease without esophagitis: Secondary | ICD-10-CM

## 2013-05-12 MED ORDER — ESOMEPRAZOLE MAGNESIUM 40 MG PO CPDR: 40.0000 mg | DELAYED_RELEASE_CAPSULE | Freq: Every day | ORAL | Status: DC

## 2013-05-14 ENCOUNTER — Ambulatory Visit: Payer: 59 | Admitting: Family Medicine

## 2013-05-14 DIAGNOSIS — Z0289 Encounter for other administrative examinations: Secondary | ICD-10-CM

## 2013-05-15 ENCOUNTER — Ambulatory Visit (INDEPENDENT_AMBULATORY_CARE_PROVIDER_SITE_OTHER): Payer: 59 | Admitting: Adult Health

## 2013-05-15 ENCOUNTER — Encounter: Payer: Self-pay | Admitting: Adult Health

## 2013-05-15 ENCOUNTER — Encounter: Payer: Self-pay | Admitting: Internal Medicine

## 2013-05-15 VITALS — BP 130/70 | HR 67 | Temp 98.0°F | Resp 12 | Ht 70.5 in | Wt 253.5 lb

## 2013-05-15 DIAGNOSIS — G4730 Sleep apnea, unspecified: Secondary | ICD-10-CM

## 2013-05-15 DIAGNOSIS — M549 Dorsalgia, unspecified: Secondary | ICD-10-CM

## 2013-05-15 DIAGNOSIS — E669 Obesity, unspecified: Secondary | ICD-10-CM

## 2013-05-15 DIAGNOSIS — Z8249 Family history of ischemic heart disease and other diseases of the circulatory system: Secondary | ICD-10-CM | POA: Insufficient documentation

## 2013-05-15 DIAGNOSIS — E785 Hyperlipidemia, unspecified: Secondary | ICD-10-CM

## 2013-05-15 DIAGNOSIS — E291 Testicular hypofunction: Secondary | ICD-10-CM

## 2013-05-15 DIAGNOSIS — R7989 Other specified abnormal findings of blood chemistry: Secondary | ICD-10-CM | POA: Insufficient documentation

## 2013-05-15 DIAGNOSIS — E119 Type 2 diabetes mellitus without complications: Secondary | ICD-10-CM

## 2013-05-15 MED ORDER — PANTOPRAZOLE SODIUM 20 MG PO TBEC: 20.0000 mg | DELAYED_RELEASE_TABLET | Freq: Two times a day (BID) | ORAL | Status: DC

## 2013-05-15 NOTE — Progress Notes (Signed)
Subjective:    Patient ID: Jesse Vargas, male    DOB: May 07, 1956, 57 y.o.   MRN: [id number]  HPI  Patient is a pleasant 57 year old male who presents to clinic to establish care. He has been followed by Dr. Laury Axon; however, was looking for a practice closer to his home. Jesse Vargas has multiple medical problems including HTN, HLD, CAD, DM type II, OSA on CPAP, Chronic pain followed by the [facility] in [place], He is currently not exercising. He is concerned about his weight gain. Does not currently follow a healthy diet. His sons called him last night and expressed concerns about his health. Their concerns is that he will not live much longer unless he makes some lifestyle changes.    Past Medical History  Diagnosis Date  . Diabetes mellitus   . Hypertension   . Cancer of kidney -status post nephrectomy     Kidney cancer 1999  . Hyperlipidemia   . Sleep apnea   . SVT (supraventricular tachycardia)     Documentation pending but occurring during the stress test with Dr. Juliann Pares  . GERD (gastroesophageal reflux disease)   . Family history of long QT syndrome      Past Surgical History  Procedure Laterality Date  . Uvulopalatopharyngoplasty      08/1988  . Tonsillectomy      08/1988  . Cholecystectomy      2000  . Nephrectomy      L---1999 Cancer  . Nasal sinus surgery      2005  . Elbow surgery      left  . Inner ear surgery      R ear  . Ingrown toenail       Family History  Problem Relation Age of Onset  . Diabetes Father   . Hypertension Father   . Hyperlipidemia Father   . Colon polyps Father   . Hypertension Mother   . Diabetes Mother   . Colon cancer Neg Hx   . Stomach cancer Neg Hx      History   Social History  . Marital Status: Married    Spouse Name: Britta Mccreedy    Number of Children: N/A  . Years of Education: N/A   Occupational History  . 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     Social History Main Topics  . Smoking status: Former Smoker -- 2.00 packs/day  for 25 years    Types: Cigarettes    Quit date: 03/20/1998  . Smokeless tobacco: Never Used  . Alcohol Use: Yes     Comment: rare  . Drug Use: No  . Sexual Activity: Yes    Partners: Female, Male     Comment: swinger   Other Topics Concern  . Not on file   Social History Narrative   Exercise--- walking at work 1 mile     Current Outpatient Prescriptions on File Prior to Visit  Medication Sig Dispense Refill  . albuterol (PROAIR HFA) 108 (90 BASE) MCG/ACT inhaler Inhale 2 puffs into the lungs 4 (four) times daily as needed for wheezing.  9 g  1  . amLODipine (NORVASC) 5 MG tablet Take 0.5 tablets (2.5 mg total) by mouth 2 (two) times daily.  90 tablet  3  . carvedilol (COREG) 25 MG tablet Take 1 tablet by mouth  twice a day  180 tablet  3  . citalopram (CELEXA) 40 MG tablet Take 1 tablet by mouth  daily  90 tablet  0  . esomeprazole (  NEXIUM) 40 MG capsule Take 1 capsule (40 mg total) by mouth daily.  90 capsule  3  . fenofibrate 160 MG tablet Take 1 tablet (160 mg total) by mouth daily.  90 tablet  3  . fluticasone (FLONASE) 50 MCG/ACT nasal spray USE TWO SPRAYS IN EACH NOSTRIL EVERY DAY  48 g  3  . lisinopril-hydrochlorothiazide (PRINZIDE,ZESTORETIC) 20-25 MG per tablet TAKE ONE TABLET BY MOUTH EVERY DAY  90 tablet  0  . meloxicam (MOBIC) 15 MG tablet Take 1 tablet (15 mg total) by mouth daily.  90 tablet  3  . metFORMIN (GLUCOPHAGE) 1000 MG tablet 1 po bid  180 tablet  3  . methylphenidate (RITALIN) 10 MG tablet Take 1 tablet (10 mg total) by mouth 3 (three) times daily.  90 tablet  0  . oxyCODONE-acetaminophen (PERCOCET) 10-325 MG per tablet Take 1 tablet by mouth every 4 (four) hours as needed for pain.      . pravastatin (PRAVACHOL) 10 MG tablet Take 1 tablet by mouth  daily  90 tablet  0  . tadalafil (CIALIS) 5 MG tablet 1 po qd  30 tablet  5  . traMADol (ULTRAM) 50 MG tablet Take 1 tablet (50 mg total) by mouth 3 (three) times daily as needed for pain.  120 tablet  0  .  glucose blood (ONE TOUCH TEST STRIPS) test strip Check Blood sugars twice a day  100 each  12  . NON FORMULARY 1 mL once a week. Nugenix-- Testosterone Booster       No current facility-administered medications on file prior to visit.    Review of Systems  Constitutional: Positive for activity change. Negative for chills.  Respiratory: Positive for shortness of breath.        Shortness of breath with exertion. Patient does not exercise  Cardiovascular: Negative.   Gastrointestinal: Negative.   Endocrine: Negative.   Genitourinary: Negative.   Musculoskeletal: Positive for back pain and neck pain.       Chronic back pain. Fibromyalgia  Skin: Negative.   Neurological: Negative.   Hematological: Negative.   Psychiatric/Behavioral: Negative for behavioral problems, confusion and agitation. The patient is nervous/anxious.        Objective:   Physical Exam  Constitutional: He is oriented to person, place, and time.  Obese, 57 y/o male in NAD  HENT:  Head: Normocephalic and atraumatic.  Neck: Normal range of motion. Neck supple. No tracheal deviation present. No thyromegaly present.  Cardiovascular: Normal rate, regular rhythm and normal heart sounds.  Exam reveals no gallop.   No murmur heard. Pulmonary/Chest: Effort normal and breath sounds normal. No respiratory distress. He has no wheezes. He has no rales.  Abdominal: Soft. Bowel sounds are normal.  Musculoskeletal: Normal range of motion. He exhibits no edema and no tenderness.  Lymphadenopathy:    He has no cervical adenopathy.  Neurological: He is alert and oriented to person, place, and time.  Skin: Skin is warm and dry.  Psychiatric: He has a normal mood and affect. His behavior is normal. Judgment and thought content normal.          Assessment & Plan:

## 2013-05-15 NOTE — Patient Instructions (Signed)
  Please have your labs done at [facility].  We will contact you once the results are available.

## 2013-05-15 NOTE — Assessment & Plan Note (Signed)
Testosterone injection weekly. This is administered by his wife. Patient followed by urology.

## 2013-05-15 NOTE — Assessment & Plan Note (Signed)
Uses CPAP every night. Has not had a repeat sleep study in greater than 15 years. He reports one is scheduled at [facility] on 06/03/2013

## 2013-05-15 NOTE — Assessment & Plan Note (Signed)
The patient has a family history of long QT syndrome. It is genotype  Negative.  The issue then becomes the absence of the genetic probe he is a concealed long QT or he doesn't have it at all. In the absence of orthostatic diagnosis is important  that he avoid QT drugs. He is advised to be aware of QT drugs.org website.

## 2013-05-15 NOTE — Progress Notes (Signed)
Pre-visit discussion using our clinic review tool. No additional management support is needed unless otherwise documented below in the visit note.  

## 2013-05-15 NOTE — Assessment & Plan Note (Addendum)
Discussion about exercise and diet. Provided patient with a copy of Dr. Melina Schools diet which is based on low glycemic index. His diet will be beneficial for his diabetes as well as his coronary artery disease. Patient reports that he is going to try this diet. Needs to gradually increase physical activity. He reports walking at work; however, stressed importance of focused activity outside of work. There is a track at his place of employment. He could easily go out to the track and begin walking briskly as part of initiating his exercise program.

## 2013-05-15 NOTE — Assessment & Plan Note (Signed)
Check hemoglobin A1c.

## 2013-05-15 NOTE — Assessment & Plan Note (Signed)
History of hyperlipidemia and hypertriglyceridemia. Check lipids

## 2013-05-15 NOTE — Assessment & Plan Note (Signed)
Followed at [facility] pain clinic. This is where he gets all of his pain medication.

## 2013-06-03 ENCOUNTER — Ambulatory Visit (HOSPITAL_BASED_OUTPATIENT_CLINIC_OR_DEPARTMENT_OTHER): Payer: 59 | Attending: Family Medicine | Admitting: Radiology

## 2013-06-03 VITALS — Ht 70.0 in | Wt 245.0 lb

## 2013-06-03 DIAGNOSIS — G4733 Obstructive sleep apnea (adult) (pediatric): Secondary | ICD-10-CM

## 2013-06-15 DIAGNOSIS — G4733 Obstructive sleep apnea (adult) (pediatric): Secondary | ICD-10-CM

## 2013-06-15 NOTE — Sleep Study (Signed)
[facility]  NAME: Jesse Vargas DATE OF BIRTH:  08-28-1955 MEDICAL RECORD NUMBER [id number]  LOCATION: [facility]  PHYSICIAN: Barbaraann Share  DATE OF STUDY: 06/03/2013  SLEEP STUDY TYPE: Nocturnal Polysomnogram               REFERRING PHYSICIAN: Lelon Perla, DO  INDICATION FOR STUDY: Hypersomnia with sleep apnea  EPWORTH SLEEPINESS SCORE:  3 HEIGHT: 5\' 10"  (177.8 cm)  WEIGHT: 245 lb (111.131 kg)    Body mass index is 35.15 kg/(m^2).  NECK SIZE: 18 in.  MEDICATIONS:   SLEEP ARCHITECTURE: The patient had a total sleep time of 308 minutes, with no slow-wave sleep or REM. Sleep onset latency was mildly increased, and sleep efficiency was moderately reduced at 80%.  RESPIRATORY DATA: The patient had 289 apneas and 195 obstructive hypopneas, giving him an AHI of 95 events per hour. Events occurred in all body positions, and there was loud snoring noted throughout.  OXYGEN DATA: The patient had oxygen desaturation as low as 84% with his obstructive events  CARDIAC DATA: Occasional PAC noted, but no clinically significant arrhythmias were seen  MOVEMENT/PARASOMNIA: The patient had no significant leg jerks or other abnormal behaviors noted.  IMPRESSION/ RECOMMENDATION:   1) very severe obstructive sleep apnea/hypopnea syndrome, with an AHI of 95 events per hour and oxygen desaturation as low as 84%. Treatment for this degree of sleep apnea should focus on CPAP as well as a trial of weight loss.  2) occasional PAC noted, but no clinically significant arrhythmias were seen  Barbaraann Share Diplomate, American Board of Sleep Medicine  ELECTRONICALLY SIGNED ON:  06/15/2013, 3:17 PM [facility] PH: [phone]   FX: [phone] ACCREDITED BY THE AMERICAN ACADEMY OF SLEEP MEDICINE

## 2013-06-17 ENCOUNTER — Telehealth: Payer: Self-pay | Admitting: Adult Health

## 2013-06-17 DIAGNOSIS — F988 Other specified behavioral and emotional disorders with onset usually occurring in childhood and adolescence: Secondary | ICD-10-CM

## 2013-06-17 NOTE — Telephone Encounter (Signed)
methylphenidate (RITALIN) 10 MG tablet #30  ( send to [facility])   methylphenidate (RITALIN) 10 MG tablet #90 to ([facility])

## 2013-06-17 NOTE — Telephone Encounter (Signed)
Please confirm that Raquel has been filling his medication and need to know how he is actually taking the medication.   Also need to know if he is completely out.  If not, would hold for Raquel.  If out, let me know.

## 2013-06-18 MED ORDER — METHYLPHENIDATE HCL 10 MG PO TABS: 10.0000 mg | ORAL_TABLET | Freq: Three times a day (TID) | ORAL | Status: DC

## 2013-06-18 NOTE — Telephone Encounter (Signed)
Ok to refill,  printed rx  

## 2013-06-18 NOTE — Telephone Encounter (Signed)
Left message to return call 

## 2013-06-18 NOTE — Telephone Encounter (Signed)
Left message for pt rx ready to be picked up

## 2013-06-18 NOTE — Telephone Encounter (Signed)
Pt states he is completely out of Ritalin and would like to get a 30-day supply.

## 2013-07-04 ENCOUNTER — Telehealth: Payer: Self-pay | Admitting: *Deleted

## 2013-07-04 ENCOUNTER — Telehealth: Payer: Self-pay | Admitting: Internal Medicine

## 2013-07-04 NOTE — Telephone Encounter (Signed)
New message   Pt called requests a time line to schedule is surgery. No further details// requests a call back to discuss.

## 2013-07-04 NOTE — Telephone Encounter (Signed)
Pt calls asking about referral to Dr. Ladona Ridgel. He states that Dr. Graciela Husbands told him he would refer him to this physician after medical records received, but pt has heard from no one about this. I explained to the patient that I would address this with Dr. Graciela Husbands and get back with him on decision. Pt agreeable to plan.

## 2013-07-04 NOTE — Telephone Encounter (Signed)
Jesse Vargas, is this your patient? Received a refill request from [facility] for Lisinopril-HCTZ. Please advise

## 2013-07-07 ENCOUNTER — Encounter: Payer: Self-pay | Admitting: Adult Health

## 2013-07-07 ENCOUNTER — Other Ambulatory Visit: Payer: Self-pay | Admitting: Adult Health

## 2013-07-07 ENCOUNTER — Other Ambulatory Visit: Payer: Self-pay | Admitting: *Deleted

## 2013-07-07 DIAGNOSIS — I10 Essential (primary) hypertension: Secondary | ICD-10-CM

## 2013-07-07 MED ORDER — ALBUTEROL SULFATE HFA 108 (90 BASE) MCG/ACT IN AERS: 2.0000 | INHALATION_SPRAY | Freq: Four times a day (QID) | RESPIRATORY_TRACT | Status: DC | PRN

## 2013-07-07 MED ORDER — MELOXICAM 15 MG PO TABS: 15.0000 mg | ORAL_TABLET | Freq: Every day | ORAL | Status: DC

## 2013-07-07 MED ORDER — LISINOPRIL-HYDROCHLOROTHIAZIDE 20-25 MG PO TABS: ORAL_TABLET | ORAL | Status: DC

## 2013-07-07 NOTE — Telephone Encounter (Signed)
Refill Request  Proair HFA AER

## 2013-07-07 NOTE — Telephone Encounter (Signed)
Yes. I sent in refill. Thanks

## 2013-07-07 NOTE — Telephone Encounter (Signed)
Proair sent. Ok refill Meloxicam?

## 2013-07-09 ENCOUNTER — Encounter: Payer: Self-pay | Admitting: Family Medicine

## 2013-07-14 ENCOUNTER — Other Ambulatory Visit: Payer: Self-pay | Admitting: *Deleted

## 2013-07-14 ENCOUNTER — Other Ambulatory Visit: Payer: Self-pay | Admitting: Adult Health

## 2013-07-14 DIAGNOSIS — F988 Other specified behavioral and emotional disorders with onset usually occurring in childhood and adolescence: Secondary | ICD-10-CM

## 2013-07-14 MED ORDER — METHYLPHENIDATE HCL 10 MG PO TABS: 10.0000 mg | ORAL_TABLET | Freq: Three times a day (TID) | ORAL | Status: DC

## 2013-07-14 MED ORDER — CYCLOBENZAPRINE HCL 10 MG PO TABS: ORAL_TABLET | ORAL | Status: DC

## 2013-07-14 NOTE — Telephone Encounter (Signed)
Is this your patient, requesting refill on Flexeril

## 2013-07-14 NOTE — Telephone Encounter (Signed)
Pt left VM, asking if he can get refill on Ritalin, and if he could pick up 3 Rxs to fill over the next 3 months?

## 2013-07-14 NOTE — Telephone Encounter (Signed)
Pt notified Rxs ready for pickup, will need office visit prior to next refill

## 2013-07-14 NOTE — Telephone Encounter (Signed)
Refill request  Cyclobenzaprine

## 2013-07-14 NOTE — Telephone Encounter (Signed)
Sent to [facility] in [place]

## 2013-07-16 ENCOUNTER — Other Ambulatory Visit: Payer: Self-pay | Admitting: *Deleted

## 2013-07-16 MED ORDER — CYCLOBENZAPRINE HCL 10 MG PO TABS: ORAL_TABLET | ORAL | Status: DC

## 2013-07-22 NOTE — Telephone Encounter (Signed)
Will schedule pt to see Dr. Lovena Le for svt ablation eval. Pt agreeable to plan.

## 2013-07-25 NOTE — Telephone Encounter (Signed)
Spoke with pt, made aware he will need to cont to see dr Harrington Challenger. If dr Lovena Le does the ablation and everything is fine he will then be sent back to dr Harrington Challenger. Patient voiced understanding

## 2013-07-25 NOTE — Telephone Encounter (Signed)
New problem   Pt need to speak to Dr Alan Ripper nurse concerning if he need to keep seeing Dr Harrington Challenger since he is seeing EP physician. Please call pt he has some questions

## 2013-08-01 ENCOUNTER — Telehealth: Payer: Self-pay | Admitting: Adult Health

## 2013-08-01 NOTE — Telephone Encounter (Signed)
For cialis sorry for not putting in original note. He also went and saw DR Jeffie Pollock at [facility] and he asked them to forward records.

## 2013-08-01 NOTE — Telephone Encounter (Signed)
Patient would like to know if he can get a prescription called in for cialis [facility] in [place]

## 2013-08-01 NOTE — Telephone Encounter (Signed)
Advised pt to contact his pharmacy, he should have refills available. Advised to have pharmacy contact us if needing refill request.

## 2013-08-08 ENCOUNTER — Institutional Professional Consult (permissible substitution): Payer: 59 | Admitting: Internal Medicine

## 2013-08-19 ENCOUNTER — Encounter: Payer: Self-pay | Admitting: Internal Medicine

## 2013-08-19 ENCOUNTER — Ambulatory Visit (INDEPENDENT_AMBULATORY_CARE_PROVIDER_SITE_OTHER): Payer: 59 | Admitting: Internal Medicine

## 2013-08-19 ENCOUNTER — Encounter: Payer: Self-pay | Admitting: *Deleted

## 2013-08-19 VITALS — BP 132/84 | HR 63 | Ht 70.0 in | Wt 253.0 lb

## 2013-08-19 DIAGNOSIS — I471 Supraventricular tachycardia: Secondary | ICD-10-CM

## 2013-08-19 DIAGNOSIS — I10 Essential (primary) hypertension: Secondary | ICD-10-CM

## 2013-08-19 DIAGNOSIS — I498 Other specified cardiac arrhythmias: Secondary | ICD-10-CM

## 2013-08-19 NOTE — Patient Instructions (Signed)

## 2013-08-19 NOTE — Assessment & Plan Note (Signed)
His symptoms are reasonably well controlled but he is intolerant of the beta blocker. I have discussed the risk/benefits/goals/expectations of the procedure with the patient and he wishes to proceed.

## 2013-08-19 NOTE — Assessment & Plan Note (Signed)
He may need a different beta blocker.

## 2013-08-19 NOTE — Progress Notes (Signed)
HPI Jesse Vargas is referred today by Dr. Harrington Challenger for evaluation of tachypalpitations. He has a h/o heart racing for several years. He has been treated with beta blockers and had improvement in his SVT. Unfortunately, he has had severe fatigue on the beta blockers and sexual dysfunction. He would like to get off of his beta blockers but has had documented SVT at rates of 220/min. He gets sob and has chest pressure but no syncope. The episodes typically last for several minutes at a time.  Allergies  Allergen Reactions  . Sulfonamide Derivatives Hives     Current Outpatient Prescriptions  Medication Sig Dispense Refill  . albuterol (PROAIR HFA) 108 (90 BASE) MCG/ACT inhaler Inhale 2 puffs into the lungs 4 (four) times daily as needed for wheezing.  9 g  1  . amLODipine (NORVASC) 5 MG tablet Take 0.5 tablets (2.5 mg total) by mouth 2 (two) times daily.  90 tablet  3  . carvedilol (COREG) 25 MG tablet Take 1 tablet by mouth  twice a day  180 tablet  3  . citalopram (CELEXA) 40 MG tablet Take 1 tablet by mouth  daily  90 tablet  0  . cyclobenzaprine (FLEXERIL) 10 MG tablet Take 1 tablet at bedtime as needed for muscle spasms  30 tablet  0  . fenofibrate 160 MG tablet Take 1 tablet (160 mg total) by mouth daily.  90 tablet  3  . fluticasone (FLONASE) 50 MCG/ACT nasal spray USE TWO SPRAYS IN EACH NOSTRIL EVERY DAY  48 g  3  . lisinopril-hydrochlorothiazide (PRINZIDE,ZESTORETIC) 20-25 MG per tablet TAKE ONE TABLET BY MOUTH EVERY DAY  90 tablet  3  . meloxicam (MOBIC) 15 MG tablet Take 1 tablet (15 mg total) by mouth daily.  90 tablet  3  . metFORMIN (GLUCOPHAGE) 1000 MG tablet 1 po bid  180 tablet  3  . [START ON 09/11/2013] methylphenidate (RITALIN) 10 MG tablet Take 1 tablet (10 mg total) by mouth 3 (three) times daily.  90 tablet  0  . NON FORMULARY 1 mL once a week. Nugenix-- Testosterone Booster      . oxyCODONE-acetaminophen (PERCOCET) 10-325 MG per tablet Take 1 tablet by mouth every 4  (four) hours as needed for pain.      . pantoprazole (PROTONIX) 20 MG tablet Take 1 tablet (20 mg total) by mouth 2 (two) times daily.  90 tablet  3  . pravastatin (PRAVACHOL) 10 MG tablet Take 1 tablet by mouth  daily  90 tablet  0  . pregabalin (LYRICA) 100 MG capsule Take 100 mg by mouth daily.      . tadalafil (CIALIS) 5 MG tablet 1 po qd  30 tablet  5  . traMADol (ULTRAM) 50 MG tablet Take 1 tablet (50 mg total) by mouth 3 (three) times daily as needed for pain.  120 tablet  0   No current facility-administered medications for this visit.     Past Medical History  Diagnosis Date  . Diabetes mellitus   . Hypertension   . Cancer of kidney -status post nephrectomy     Kidney cancer 1999  . Hyperlipidemia   . Sleep apnea   . SVT (supraventricular tachycardia)     Documentation pending but occurring during the stress test with Dr. Clayborn Bigness  . GERD (gastroesophageal reflux disease)   . Family history of long QT syndrome     ROS:   All systems reviewed and negative except as noted in  the HPI.   Past Surgical History  Procedure Laterality Date  . Uvulopalatopharyngoplasty      08/1988  . Tonsillectomy      08/1988  . Cholecystectomy      2000  . Nephrectomy      L---1999 Cancer  . Nasal sinus surgery      2005  . Elbow surgery      left  . Inner ear surgery      R ear  . Ingrown toenail       Family History  Problem Relation Age of Onset  . Diabetes Father   . Hypertension Father   . Hyperlipidemia Father   . Colon polyps Father   . Hypertension Mother   . Diabetes Mother   . Colon cancer Neg Hx   . Stomach cancer Neg Hx      History   Social History  . Marital Status: Married    Spouse Name: Pamala Hurry    Number of Children: N/A  . Years of Education: N/A   Occupational History  . lab [employer]  . Smoking status: Former Smoker -- 2.00 packs/day for 25 years    Types: Cigarettes    Quit date: 03/20/1998  .  Smokeless tobacco: Never Used  . Alcohol Use: Yes     Comment: rare  . Drug Use: No  . Sexual Activity: Yes    Partners: Female, Male     Comment: swinger   Other Topics Concern  . Not on file   Social History Narrative   Exercise--- walking at work 1 mile     BP 132/84  Pulse 63  Ht 5\' 10"  (1.778 m)  Wt 253 lb (114.76 kg)  BMI 36.30 kg/m2  Physical Exam:  Well appearing NAD HEENT: Unremarkable Neck:  No JVD, no thyromegally Back:  No CVA tenderness Lungs:  Clear with no wheezes HEART:  Regular rate rhythm, no murmurs, no rubs, no clicks Abd:  soft, positive bowel sounds, no organomegally, no rebound, no guarding Ext:  2 plus pulses, no edema, no cyanosis, no clubbing Skin:  No rashes no nodules Neuro:  CN II through XII intact, motor grossly intact  EKG - NSR with a fairly short PR.   Assess/Plan:

## 2013-08-20 LAB — BASIC METABOLIC PANEL
BUN: 16 mg/dL (ref 6–23)
CO2: 29 mEq/L (ref 19–32)
CREATININE: 1 mg/dL (ref 0.4–1.5)
Calcium: 9.4 mg/dL (ref 8.4–10.5)
Chloride: 99 mEq/L (ref 96–112)
GFR: 81.29 mL/min (ref >60.00)
Glucose, Bld: 151 mg/dL — ABNORMAL HIGH (ref 70–99)
Potassium: 3.5 mEq/L (ref 3.5–5.1)
Sodium: 138 mEq/L (ref 135–145)

## 2013-08-20 LAB — CBC WITH DIFFERENTIAL/PLATELET
BASOS ABS: 0.1 10*3/uL (ref 0.0–0.1)
Basophils Relative: 0.7 % (ref 0.0–3.0)
EOS PCT: 2.2 % (ref 0.0–5.0)
Eosinophils Absolute: 0.2 10*3/uL (ref 0.0–0.7)
HCT: 45.3 % (ref 39.0–52.0)
Hemoglobin: 14.7 g/dL (ref 13.0–17.0)
Lymphocytes Relative: 38.3 % (ref 12.0–46.0)
Lymphs Abs: 3.4 10*3/uL (ref 0.7–4.0)
MCHC: 32.5 g/dL (ref 30.0–36.0)
MCV: 92.5 fl (ref 78.0–100.0)
MONO ABS: 0.6 10*3/uL (ref 0.1–1.0)
MONOS PCT: 7.3 % (ref 3.0–12.0)
NEUTROS PCT: 51.5 % (ref 43.0–77.0)
Neutro Abs: 4.5 10*3/uL (ref 1.4–7.7)
PLATELETS: 190 10*3/uL (ref 150.0–400.0)
RBC: 4.9 Mil/uL (ref 4.22–5.81)
RDW: 14 % (ref 11.5–14.6)
WBC: 8.8 10*3/uL (ref 4.5–10.5)

## 2013-08-21 ENCOUNTER — Encounter (HOSPITAL_COMMUNITY): Payer: Self-pay | Admitting: Pharmacy Technician

## 2013-08-27 ENCOUNTER — Ambulatory Visit (HOSPITAL_COMMUNITY)
Admission: RE | Admit: 2013-08-27 | Discharge: 2013-08-27 | Disposition: A | Discharge status: Home / Self Care | Payer: 59 | Source: Ambulatory Visit | Attending: Internal Medicine | Admitting: Internal Medicine

## 2013-08-27 ENCOUNTER — Encounter (HOSPITAL_COMMUNITY): Payer: Self-pay | Admitting: *Deleted

## 2013-08-27 ENCOUNTER — Encounter (HOSPITAL_COMMUNITY)
Admission: RE | Disposition: A | Discharge status: Home / Self Care | Payer: Self-pay | Source: Ambulatory Visit | Attending: Internal Medicine

## 2013-08-27 DIAGNOSIS — Z79899 Other long term (current) drug therapy: Secondary | ICD-10-CM | POA: Insufficient documentation

## 2013-08-27 DIAGNOSIS — E785 Hyperlipidemia, unspecified: Secondary | ICD-10-CM

## 2013-08-27 DIAGNOSIS — I10 Essential (primary) hypertension: Secondary | ICD-10-CM

## 2013-08-27 DIAGNOSIS — I471 Supraventricular tachycardia, unspecified: Secondary | ICD-10-CM

## 2013-08-27 DIAGNOSIS — Z8249 Family history of ischemic heart disease and other diseases of the circulatory system: Secondary | ICD-10-CM

## 2013-08-27 DIAGNOSIS — F411 Generalized anxiety disorder: Secondary | ICD-10-CM

## 2013-08-27 DIAGNOSIS — F341 Dysthymic disorder: Secondary | ICD-10-CM

## 2013-08-27 DIAGNOSIS — R7989 Other specified abnormal findings of blood chemistry: Secondary | ICD-10-CM

## 2013-08-27 DIAGNOSIS — D239 Other benign neoplasm of skin, unspecified: Secondary | ICD-10-CM

## 2013-08-27 DIAGNOSIS — C649 Malignant neoplasm of unspecified kidney, except renal pelvis: Secondary | ICD-10-CM

## 2013-08-27 DIAGNOSIS — M797 Fibromyalgia: Secondary | ICD-10-CM

## 2013-08-27 DIAGNOSIS — E119 Type 2 diabetes mellitus without complications: Secondary | ICD-10-CM

## 2013-08-27 DIAGNOSIS — M549 Dorsalgia, unspecified: Secondary | ICD-10-CM

## 2013-08-27 DIAGNOSIS — K219 Gastro-esophageal reflux disease without esophagitis: Secondary | ICD-10-CM | POA: Insufficient documentation

## 2013-08-27 DIAGNOSIS — E669 Obesity, unspecified: Secondary | ICD-10-CM

## 2013-08-27 DIAGNOSIS — N529 Male erectile dysfunction, unspecified: Secondary | ICD-10-CM

## 2013-08-27 DIAGNOSIS — F988 Other specified behavioral and emotional disorders with onset usually occurring in childhood and adolescence: Secondary | ICD-10-CM

## 2013-08-27 DIAGNOSIS — L600 Ingrowing nail: Secondary | ICD-10-CM

## 2013-08-27 DIAGNOSIS — R809 Proteinuria, unspecified: Secondary | ICD-10-CM

## 2013-08-27 DIAGNOSIS — G4730 Sleep apnea, unspecified: Secondary | ICD-10-CM

## 2013-08-27 DIAGNOSIS — I498 Other specified cardiac arrhythmias: Secondary | ICD-10-CM | POA: Insufficient documentation

## 2013-08-27 DIAGNOSIS — I2510 Atherosclerotic heart disease of native coronary artery without angina pectoris: Secondary | ICD-10-CM

## 2013-08-27 DIAGNOSIS — G4733 Obstructive sleep apnea (adult) (pediatric): Secondary | ICD-10-CM

## 2013-08-27 DIAGNOSIS — L708 Other acne: Secondary | ICD-10-CM

## 2013-08-27 HISTORY — PX: ABLATION: SHX5711

## 2013-08-27 HISTORY — PX: SUPRAVENTRICULAR TACHYCARDIA ABLATION: SHX5492

## 2013-08-27 LAB — GLUCOSE, CAPILLARY
Glucose-Capillary: 136 mg/dL — ABNORMAL HIGH (ref 70–99)
Glucose-Capillary: 106 mg/dL — ABNORMAL HIGH (ref 70–99)
Glucose-Capillary: 100 mg/dL — ABNORMAL HIGH (ref 70–99)

## 2013-08-27 SURGERY — SUPRAVENTRICULAR TACHYCARDIA ABLATION
Anesthesia: LOCAL

## 2013-08-27 MED ORDER — AMLODIPINE BESYLATE 5 MG PO TABS
5.0000 mg | ORAL_TABLET | Freq: Every day | ORAL | Status: DC
  Filled 2013-08-27: qty 1

## 2013-08-27 MED ORDER — LISINOPRIL 20 MG PO TABS
20.0000 mg | ORAL_TABLET | Freq: Every day | ORAL | Status: DC
  Administered 2013-08-27: 20 mg via ORAL
  Filled 2013-08-27: qty 1

## 2013-08-27 MED ORDER — SIMVASTATIN 5 MG PO TABS
5.0000 mg | ORAL_TABLET | Freq: Every day | ORAL | Status: DC
  Administered 2013-08-27: 5 mg via ORAL
  Filled 2013-08-27: qty 1

## 2013-08-27 MED ORDER — MIDAZOLAM HCL 5 MG/5ML IJ SOLN
INTRAMUSCULAR | Status: AC
  Filled 2013-08-27: qty 5

## 2013-08-27 MED ORDER — ALBUTEROL SULFATE (2.5 MG/3ML) 0.083% IN NEBU: 6.0000 mL | INHALATION_SOLUTION | Freq: Four times a day (QID) | RESPIRATORY_TRACT | Status: DC | PRN

## 2013-08-27 MED ORDER — SODIUM CHLORIDE 0.9 % IJ SOLN: 3.0000 mL | INTRAMUSCULAR | Status: DC | PRN

## 2013-08-27 MED ORDER — FENTANYL CITRATE 0.05 MG/ML IJ SOLN
INTRAMUSCULAR | Status: AC
  Filled 2013-08-27: qty 2

## 2013-08-27 MED ORDER — TRAMADOL HCL 50 MG PO TABS: 50.0000 mg | ORAL_TABLET | Freq: Three times a day (TID) | ORAL | Status: DC | PRN

## 2013-08-27 MED ORDER — PREGABALIN 50 MG PO CAPS: 100.0000 mg | ORAL_CAPSULE | Freq: Every day | ORAL | Status: DC

## 2013-08-27 MED ORDER — OXYCODONE-ACETAMINOPHEN 10-325 MG PO TABS
1.0000 | ORAL_TABLET | ORAL | Status: DC | PRN
  Filled 2013-08-27: qty 1

## 2013-08-27 MED ORDER — OXYCODONE HCL 5 MG PO TABS: 5.0000 mg | ORAL_TABLET | ORAL | Status: DC | PRN

## 2013-08-27 MED ORDER — LISINOPRIL-HYDROCHLOROTHIAZIDE 20-25 MG PO TABS: 1.0000 | ORAL_TABLET | Freq: Every day | ORAL | Status: DC

## 2013-08-27 MED ORDER — SODIUM CHLORIDE 0.9 % IJ SOLN: 3.0000 mL | Freq: Two times a day (BID) | INTRAMUSCULAR | Status: DC

## 2013-08-27 MED ORDER — HYDROCHLOROTHIAZIDE 25 MG PO TABS
25.0000 mg | ORAL_TABLET | Freq: Every day | ORAL | Status: DC
  Administered 2013-08-27: 25 mg via ORAL
  Filled 2013-08-27: qty 1

## 2013-08-27 MED ORDER — MELOXICAM 15 MG PO TABS
15.0000 mg | ORAL_TABLET | Freq: Every day | ORAL | Status: DC
  Filled 2013-08-27: qty 1

## 2013-08-27 MED ORDER — ISOPROTERENOL HCL 0.2 MG/ML IJ SOLN
INTRAMUSCULAR | Status: AC
  Filled 2013-08-27: qty 5

## 2013-08-27 MED ORDER — BUPIVACAINE HCL (PF) 0.25 % IJ SOLN
INTRAMUSCULAR | Status: AC
  Filled 2013-08-27: qty 60

## 2013-08-27 MED ORDER — OXYCODONE-ACETAMINOPHEN 5-325 MG PO TABS
1.0000 | ORAL_TABLET | ORAL | Status: DC | PRN
  Administered 2013-08-27: 1 via ORAL
  Filled 2013-08-27: qty 1

## 2013-08-27 MED ORDER — METFORMIN HCL 500 MG PO TABS
1000.0000 mg | ORAL_TABLET | Freq: Two times a day (BID) | ORAL | Status: DC
  Administered 2013-08-27: 1000 mg via ORAL
  Filled 2013-08-27 (×2): qty 2

## 2013-08-27 MED ORDER — ASPIRIN EC 325 MG PO TBEC: 325.0000 mg | DELAYED_RELEASE_TABLET | Freq: Every day | ORAL | Status: DC

## 2013-08-27 MED ORDER — FLUTICASONE PROPIONATE 50 MCG/ACT NA SUSP
2.0000 | Freq: Every day | NASAL | Status: DC
  Filled 2013-08-27: qty 16

## 2013-08-27 MED ORDER — PANTOPRAZOLE SODIUM 20 MG PO TBEC
20.0000 mg | DELAYED_RELEASE_TABLET | Freq: Two times a day (BID) | ORAL | Status: DC
  Administered 2013-08-27: 20 mg via ORAL
  Filled 2013-08-27 (×2): qty 1

## 2013-08-27 MED ORDER — METHYLPHENIDATE HCL 5 MG PO TABS: 10.0000 mg | ORAL_TABLET | Freq: Three times a day (TID) | ORAL | Status: DC

## 2013-08-27 MED ORDER — FENOFIBRATE 160 MG PO TABS
160.0000 mg | ORAL_TABLET | Freq: Every day | ORAL | Status: DC
  Administered 2013-08-27: 160 mg via ORAL
  Filled 2013-08-27: qty 1

## 2013-08-27 MED ORDER — SODIUM CHLORIDE 0.9 % IV SOLN: 250.0000 mL | INTRAVENOUS | Status: DC | PRN

## 2013-08-27 NOTE — Discharge Instructions (Signed)
No driving for 3 days. No lifting over 5 lbs for 1 week. No sexual activity for 1 week. You may return to work in 1 week on 09/03/2013. Keep procedure site clean & dry. If you notice increased pain, swelling, bleeding or pus, call/return!  You may shower, but no soaking baths/hot tubs/pools for 1 week.

## 2013-08-27 NOTE — Interval H&P Note (Signed)
History and Physical Interval Note: Since prior clinic visit, no change in the history, physical exam, assessment and plan. 08/27/2013 8:08 AM  Jesse Vargas  has presented today for surgery, with the diagnosis of SVT  The various methods of treatment have been discussed with the patient and family. After consideration of risks, benefits and other options for treatment, the patient has consented to  Procedure(s): SUPRAVENTRICULAR TACHYCARDIA ABLATION (N/A) as a surgical intervention .  The patient's history has been reviewed, patient examined, no change in status, stable for surgery.  I have reviewed the patient's chart and labs.  Questions were answered to the patient's satisfaction.     Mikle Bosworth.D.

## 2013-08-27 NOTE — CV Procedure (Signed)
EPS/RFA of AVNRT carried out without immediate complication. T#[id number].

## 2013-08-27 NOTE — H&P (View-Only) (Signed)
HPI [name] is referred today by Dr. Harrington Challenger for evaluation of tachypalpitations. He has a h/o heart racing for several years. He has been treated with beta blockers and had improvement in his SVT. Unfortunately, he has had severe fatigue on the beta blockers and sexual dysfunction. He would like to get off of his beta blockers but has had documented SVT at rates of 220/min. He gets sob and has chest pressure but no syncope. The episodes typically last for several minutes at a time.  Allergies  Allergen Reactions  . Sulfonamide Derivatives Hives     Current Outpatient Prescriptions  Medication Sig Dispense Refill  . albuterol (PROAIR HFA) 108 (90 BASE) MCG/ACT inhaler Inhale 2 puffs into the lungs 4 (four) times daily as needed for wheezing.  9 g  1  . amLODipine (NORVASC) 5 MG tablet Take 0.5 tablets (2.5 mg total) by mouth 2 (two) times daily.  90 tablet  3  . carvedilol (COREG) 25 MG tablet Take 1 tablet by mouth  twice a day  180 tablet  3  . citalopram (CELEXA) 40 MG tablet Take 1 tablet by mouth  daily  90 tablet  0  . cyclobenzaprine (FLEXERIL) 10 MG tablet Take 1 tablet at bedtime as needed for muscle spasms  30 tablet  0  . fenofibrate 160 MG tablet Take 1 tablet (160 mg total) by mouth daily.  90 tablet  3  . fluticasone (FLONASE) 50 MCG/ACT nasal spray USE TWO SPRAYS IN EACH NOSTRIL EVERY DAY  48 g  3  . lisinopril-hydrochlorothiazide (PRINZIDE,ZESTORETIC) 20-25 MG per tablet TAKE ONE TABLET BY MOUTH EVERY DAY  90 tablet  3  . meloxicam (MOBIC) 15 MG tablet Take 1 tablet (15 mg total) by mouth daily.  90 tablet  3  . metFORMIN (GLUCOPHAGE) 1000 MG tablet 1 po bid  180 tablet  3  . [START ON 09/11/2013] methylphenidate (RITALIN) 10 MG tablet Take 1 tablet (10 mg total) by mouth 3 (three) times daily.  90 tablet  0  . NON FORMULARY 1 mL once a week. Nugenix-- Testosterone Booster      . oxyCODONE-acetaminophen (PERCOCET) 10-325 MG per tablet Take 1 tablet by mouth every 4  (four) hours as needed for pain.      . pantoprazole (PROTONIX) 20 MG tablet Take 1 tablet (20 mg total) by mouth 2 (two) times daily.  90 tablet  3  . pravastatin (PRAVACHOL) 10 MG tablet Take 1 tablet by mouth  daily  90 tablet  0  . pregabalin (LYRICA) 100 MG capsule Take 100 mg by mouth daily.      . tadalafil (CIALIS) 5 MG tablet 1 po qd  30 tablet  5  . traMADol (ULTRAM) 50 MG tablet Take 1 tablet (50 mg total) by mouth 3 (three) times daily as needed for pain.  120 tablet  0   No current facility-administered medications for this visit.     Past Medical History  Diagnosis Date  . Diabetes mellitus   . Hypertension   . Cancer of kidney -status post nephrectomy     Kidney cancer 1999  . Hyperlipidemia   . Sleep apnea   . SVT (supraventricular tachycardia)     Documentation pending but occurring during the stress test with Dr. Clayborn Bigness  . GERD (gastroesophageal reflux disease)   . Family history of long QT syndrome     ROS:   All systems reviewed and negative except as noted in  the HPI.   Past Surgical History  Procedure Laterality Date  . Uvulopalatopharyngoplasty      08/1988  . Tonsillectomy      08/1988  . Cholecystectomy      2000  . Nephrectomy      L---1999 Cancer  . Nasal sinus surgery      2005  . Elbow surgery      left  . Inner ear surgery      R ear  . Ingrown toenail       Family History  Problem Relation Age of Onset  . Diabetes Father   . Hypertension Father   . Hyperlipidemia Father   . Colon polyps Father   . Hypertension Mother   . Diabetes Mother   . Colon cancer Neg Hx   . Stomach cancer Neg Hx      History   Social History  . Marital Status: Married    Spouse Name: Barbara    Number of Children: N/A  . Years of Education: N/A   Occupational History  . [employer]  .     Social History Main Topics  . Smoking status: Former Smoker -- 2.00 packs/day for 25 years    Types: Cigarettes    Quit date: 03/20/1998  .  Smokeless tobacco: Never Used  . Alcohol Use: Yes     Comment: rare  . Drug Use: No  . Sexual Activity: Yes    Partners: Female, Male     Comment: swinger   Other Topics Concern  . Not on file   Social History Narrative   Exercise--- walking at work 1 mile     BP 132/84  Pulse 63  Ht 5' 10" (1.778 m)  Wt 253 lb (114.76 kg)  BMI 36.30 kg/m2  Physical Exam:  Well appearing NAD HEENT: Unremarkable Neck:  No JVD, no thyromegally Back:  No CVA tenderness Lungs:  Clear with no wheezes HEART:  Regular rate rhythm, no murmurs, no rubs, no clicks Abd:  soft, positive bowel sounds, no organomegally, no rebound, no guarding Ext:  2 plus pulses, no edema, no cyanosis, no clubbing Skin:  No rashes no nodules Neuro:  CN II through XII intact, motor grossly intact  EKG - NSR with a fairly short PR.   Assess/Plan: 

## 2013-08-27 NOTE — Discharge Summary (Signed)
ELECTROPHYSIOLOGY DISCHARGE SUMMARY    Patient ID: Jesse Vargas,  MRN: [id number], DOB/AGE: Dec 24, 1955 58 y.o.  Admit date: 08/27/2013 Discharge date: 08/27/2013  Primary Care Physician: Garnet Koyanagi, DO Primary EP: Lovena Le, MD  Primary Discharge Diagnosis:  1. 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 HTN 2. DM 3. Dyslipidemia 4. OSA 5. GERD 6. Erectile dysfunction  Procedures This Admission:  1. EP study +RF ablation of AVNRT Please see dictated operative report for full details (not transcribed / available at the time of discharge)   History and Hospital Course:  Jesse Vargas is a 58 year old gentleman referred to Dr. Lovena Le for evaluation of tachypalpitations. He has had "racing" palpitations for several years. He has been treated with beta blockers with improvement in his SVT. Unfortunately, he has had severe fatigue on the beta blockers and erectile dysfunction. He would like to stop his beta blocker but has had documented SVT at rates of 220/min. His palpitations are accompanied by SOB and chest pressure but no syncope. The episodes typically last for several minutes. After seeing Dr. Lovena Le in consultation, he elected definitive treatment of his SVT and therefore presented today for EP study. 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 Please see dictated operative report for full details (not transcribed / available at the time of discharge). Mr. Haroon tolerated this procedure well without any immediate complication. He remains hemodynamically stable and afebrile. His groin site is intact without significant bleeding or hematoma. He has been given discharge instructions including wound care and activity restrictions. He will follow-up in clinic in 4 weeks. He has been seen, examined and deemed stable for discharge today by Dr. Cristopher Peru.  Discharge Vitals: Blood pressure 117/73, pulse 61, temperature 97.4 F (36.3 C),  temperature source Oral, resp. rate 18, height 5\' 10"  (1.778 m), weight 245 lb (111.131 kg), SpO2 96.00%.   Labs: Lab Results  Component Value Date   WBC 8.8 08/19/2013   HGB 14.7 08/19/2013   HCT 45.3 08/19/2013   MCV 92.5 08/19/2013   PLT 190.0 08/19/2013      Component Value Date/Time   NA 138 08/19/2013 1636   K 3.5 08/19/2013 1636   CL 99 08/19/2013 1636   CO2 29 08/19/2013 1636   GLUCOSE 151* 08/19/2013 1636   BUN 16 08/19/2013 1636   CREATININE 1.0 08/19/2013 1636   CALCIUM 9.4 08/19/2013 1636   GFRNONAA 78.84 11/15/2009 0947   GFRAA 115 07/13/2008 1515    Disposition:  The patient is being discharged in stable condition.  Follow-up: Follow-up Information   Follow up with Ileene Hutchinson, PA-C On 09/30/2013. (At 11:00 AM for hospital follow-up (Dr. Tanna Furry PA))    Specialty:  Cardiology   Contact information:   [address] [place] [address] [phone]      Discharge Medications:    Medication List    STOP taking these medications       carvedilol 25 MG tablet  Commonly known as:  COREG      TAKE these medications       albuterol 108 (90 BASE) MCG/ACT inhaler  Commonly known as:  PROAIR HFA  Inhale 2 puffs into the lungs 4 (four) times daily as needed for wheezing.     amLODipine 5 MG tablet  Commonly known as:  NORVASC  Take 5 mg by mouth daily.     aspirin 325 MG EC tablet  Take  325 mg by mouth daily.     citalopram 40 MG tablet  Commonly known as:  CELEXA  Take 1 tablet by mouth  daily     cyclobenzaprine 10 MG tablet  Commonly known as:  FLEXERIL  Take 1 tablet at bedtime as needed for muscle spasms     fenofibrate 160 MG tablet  Take 1 tablet (160 mg total) by mouth daily.     fluticasone 50 MCG/ACT nasal spray  Commonly known as:  FLONASE  Place 2 sprays into both nostrils daily.     lisinopril-hydrochlorothiazide 20-25 MG per tablet  Commonly known as:  PRINZIDE,ZESTORETIC  Take 1 tablet by mouth daily.     meloxicam 15  MG tablet  Commonly known as:  MOBIC  Take 1 tablet (15 mg total) by mouth daily.     metFORMIN 1000 MG tablet  Commonly known as:  GLUCOPHAGE  Take 1,000 mg by mouth 2 (two) times daily with a meal. 1 po bid     methylphenidate 10 MG tablet  Commonly known as:  RITALIN  Take 1 tablet (10 mg total) by mouth 3 (three) times daily.  Start taking on:  09/11/2013     NON FORMULARY  Take 1 mL by mouth once a week. Nugenix-- Testosterone Booster     oxyCODONE-acetaminophen 10-325 MG per tablet  Commonly known as:  PERCOCET  Take 1 tablet by mouth every 4 (four) hours as needed for pain.     pantoprazole 20 MG tablet  Commonly known as:  PROTONIX  Take 1 tablet (20 mg total) by mouth 2 (two) times daily.     pravastatin 10 MG tablet  Commonly known as:  PRAVACHOL  Take 1 tablet by mouth  daily     pregabalin 100 MG capsule  Commonly known as:  LYRICA  Take 100 mg by mouth daily.     tadalafil 5 MG tablet  Commonly known as:  CIALIS  Take 5 mg by mouth daily as needed for erectile dysfunction. 1 po qd     traMADol 50 MG tablet  Commonly known as:  ULTRAM  Take 1 tablet (50 mg total) by mouth 3 (three) times daily as needed for pain.       Duration of Discharge Encounter: Greater than 30 minutes including physician time.  Signed, Ileene Hutchinson, PA-C 08/27/2013, 12:23 PM  EP Attending  Agree with above. S/p successful EPS/RFA of AVNRT. [place] for discharge.  Mikle Bosworth.D.

## 2013-08-28 ENCOUNTER — Encounter (HOSPITAL_COMMUNITY): Payer: Self-pay | Admitting: *Deleted

## 2013-08-28 ENCOUNTER — Telehealth: Payer: Self-pay | Admitting: Internal Medicine

## 2013-08-28 NOTE — Op Note (Signed)
NAMETEVEN, MITTMAN NO.:  [id number]  MEDICAL RECORD NO.:  [id number]  LOCATION:  3W33C                        FACILITY:  [facility]  PHYSICIAN:  Champ Mungo. 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  INTRODUCTION:  The patient is a very pleasant 58 year old male with recurrent tachy palpitations and refractory to medical therapy with beta- blockers.  In addition, he has had severe fatigue on beta-blockers and is now referred for catheter ablation of his SVT.  He has had documented SVT at rates of 190 beats per minute.  DESCRIPTION OF PROCEDURE:  After informed consent was obtained, the patient was taken to the diagnostic EP lab in a fasting state.  After usual preparation and draping, intravenous fentanyl and midazolam were given for sedation.  A 6-French hexapolar catheter was inserted percutaneously into the right jugular vein and advanced to coronary sinus.  A 6-French quadripolar catheter was inserted percutaneously in the right femoral vein and advanced to the His bundle region.  A 6- French quadripolar catheter was inserted percutaneously in the right femoral vein and advanced to the right ventricle.  After measurement of basic intervals, rapid ventricular pacing was carried out from the right ventricle at a base drive cycle length of 600 milliseconds and stepwise decreased down to 380 milliseconds where VA Wenckebach was observed. During rapid ventricular pacing, the atrial activation sequence was midline and decremental.  Next, programed ventricular stimulation was carried out from the right ventricle at a base drive cycle length of 600 milliseconds.  The S1-S2 interval was stepwise decreased from 540 milliseconds down to 220  milliseconds where ventricular refractoriness was observed.  During programed ventricular stimulation, the atrial activation sequence remained midline and decremental.  Next, rapid atrial pacing was carried out from the atrium at a pacing cycle length of 600 milliseconds, stepwise decreased down to 310 milliseconds where AV Wenckebach was observed.  During rapid atrial pacing, the PR interval was greater than the RR interval and there was double echo beats, but there was no inducible SVT.  Next, programed atrial stimulation was carried out from the atrium at a base drive cycle length of 500 milliseconds.  The S1-S2 interval was stepwise decreased from 440 milliseconds down to 240 milliseconds where atrial refractoriness was observed.  During programed atrial stimulation, there were multiple AH jumps and echo beats and double echo beats, but no inducible SVT.  At this point, isoproterenol was infused at a rate of 2 mcg/minute. Additional rapid atrial pacing was carried out and stepwise decreased down to 300, then to 290 milliseconds, resulting in the initiation of SVT.  Mapping was carried out.  During SVT, the VA interval was essentially 0.  PVCs replaced at the time of His bundle refractoriness which did not pre-excite the atrium.  Ventricular pacing during tachycardia resulted in a VAV activation sequence.  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 Isoproterenol was discontinued.  A 7-French quadripolar ablation catheter  was then inserted percutaneously into the right femoral vein and advanced under fluoroscopic guidance into the right atrium.  Mapping of the region of Derrill Kay triangle was carried out.  Derrill Kay triangle was exceedingly small.  Six very brief RF energy applications were then applied to [place] triangle between sites 4 and 5.  During RF energy application, there was accelerated junctional rhythm.  At times, accelerated junctional rhythm was  quite fast and the catheter was removed and the radiofrequency energy discontinued.  Following RF energy application, isoproterenol was again infused at rates of 2 to 4 mcg/minute.  Additional rapid atrial pacing and programed atrial stimulation was carried out and there was no inducible SVT.  In addition, there were no AH jumps, and no echo beats noted.  PR interval was no longer greater than the RR interval.  At this point, the catheters were removed, and the patient was returned to the recovery area to have his sheath pulled.  COMPLICATIONS:  There were no immediate procedure complications.  RESULTS:  A.  Baseline ECG.  Baseline ECG demonstrates sinus rhythm with normal axis and intervals. B.  Baseline intervals.  The sinus node cycle length was 937 milliseconds.  The QRS duration was 110 milliseconds.  The HV interval was 40 milliseconds.  The AH interval was 80 milliseconds.  Following ablation, there was no significant difference in the intervals. C.  Rapid ventricular pacing.  Rapid ventricular pacing was carried out from the right ventricle at a pacing cycle length of 600 milliseconds and stepwise decreased down to 380 milliseconds with a VA Wenckebach was demonstrated.  During rapid ventricular pacing, the atrial activation sequence remained midline and decremental. D.  Programed ventricular stimulation.  Programed ventricular stimulation was carried out from the right ventricle at a base drive cycle length of 600 milliseconds.  The S1-S2 interval was stepwise decreased from 540 milliseconds down to 220 milliseconds where ventricular refractoriness was observed.  During programed ventricular stimulation, there were no inducible SVT and the atrial activation sequence remained midline and decremental. E.  Programed atrial stimulation.  Programed atrial stimulation was carried out from the atrium at a base drive cycle length of 500 milliseconds.  The S1-S2 interval was stepwise  decreased from 440 milliseconds down to 240 milliseconds where atrial refractoriness was observed.  During programed atrial stimulation, there were multiple AH jumps and echo beats noted as well as double echo beats. F.  Rapid atrial pacing.  Rapid atrial pacing was carried out from the atrium at a pacing cycle length of 600 milliseconds and stepwise decreased down to 310 milliseconds where AV Wenckebach was observed. During rapid atrial pacing, the PR interval was greater than the RR interval.  On isoproterenol, rapid atrial pacing resulted in the initiation of SVT.  Following catheter ablation, rapid atrial pacing resulted in a PR interval being less than the RR interval, and no inducible SVT. G.  Arrhythmias observed. 1. AV node reentrant tachycardia initiation is with rapid atrial     pacing on isoproterenol, the duration was sustained.  The method of     termination was with rapid atrial pacing at 240 milliseconds.     a.     Mapping.  Mapping of Derrill Kay triangle demonstrated a very, very      small Naval architect.     b.     RF energy application.  A total of six brief RF energy      applications were delivered to sites 4 through 6 and Koch  triangle.  During RF energy application, there was accelerated      junctional rhythm.  CONCLUSION:  This study demonstrates successful electrophysiologic study and RF catheter ablation of AV node reentrant tachycardia with 6 RF energy applications delivered to sites 4 through 6 and Koch triangle. Following catheter ablation, there was no residual inducible supraventricular tachycardia.     Champ Mungo. Lovena Le, MD     GWT/MEDQ  D:  08/27/2013  T:  08/28/2013  Job:  [id number]

## 2013-08-28 NOTE — Telephone Encounter (Signed)
New Problem:  Pt is calling back for clarification on when he can return to work and what his restrictions are.. If any restrictions.Marland Kitchen Marland KitchenMarland Kitchen

## 2013-08-29 NOTE — Telephone Encounter (Signed)
lmom for patient that the usual is one week post ablation to return to work.  But can call me if needed

## 2013-09-01 ENCOUNTER — Other Ambulatory Visit: Payer: Self-pay | Admitting: *Deleted

## 2013-09-01 MED ORDER — CITALOPRAM HYDROBROMIDE 40 MG PO TABS: ORAL_TABLET | ORAL | Status: DC

## 2013-09-02 ENCOUNTER — Telehealth: Payer: Self-pay | Admitting: *Deleted

## 2013-09-02 NOTE — Telephone Encounter (Signed)
I would cut coreg in 1/2  Take 12.5 bid   Follow BP

## 2013-09-02 NOTE — Telephone Encounter (Signed)
Call from patient c/o headache daily since his ablation last week.   He wonders if this is related to dc'd coreg s/p ablation.   BP readings are basically unchanged with and without coreg: 140s-150s / 80s-90s.  HR 60s-80s.  Instructed him to try tylenol for his headache, and that I do not think there is a connection between his procedure/stopping coreg and his headaches.    Told him I would forward to Dr. Harrington Challenger for recommendations.

## 2013-09-03 ENCOUNTER — Other Ambulatory Visit: Payer: Self-pay | Admitting: Family Medicine

## 2013-09-03 MED ORDER — CARVEDILOL 12.5 MG PO TABS: 12.5000 mg | ORAL_TABLET | Freq: Two times a day (BID) | ORAL | Status: DC

## 2013-09-03 NOTE — Telephone Encounter (Signed)
Spoke with pt, Aware of dr ross recommendation  

## 2013-09-03 NOTE — Telephone Encounter (Signed)
Unable to reach pt or leave a message, home phone is disconnected.

## 2013-09-05 ENCOUNTER — Telehealth: Payer: Self-pay | Admitting: Adult Health

## 2013-09-05 NOTE — Telephone Encounter (Signed)
Message to pt regarding pravachol and fenofibrate

## 2013-09-19 ENCOUNTER — Telehealth: Payer: Self-pay | Admitting: Adult Health

## 2013-09-19 NOTE — Telephone Encounter (Signed)
The patient called needing a new prescription for his ritalin . He stated that he had not filled a prescription since December 2014.

## 2013-09-19 NOTE — Telephone Encounter (Signed)
Patient called back and states he called his pharmacy and they do have his prescription for ritalin on file. He has refills on this prescription through April. He no longer needs a new script.

## 2013-09-26 ENCOUNTER — Encounter: Payer: Self-pay | Admitting: Family Medicine

## 2013-09-29 ENCOUNTER — Other Ambulatory Visit: Payer: Self-pay | Admitting: *Deleted

## 2013-09-29 ENCOUNTER — Ambulatory Visit: Payer: 59 | Admitting: Adult Health

## 2013-09-29 MED ORDER — TADALAFIL 5 MG PO TABS: 5.0000 mg | ORAL_TABLET | Freq: Every day | ORAL | Status: DC | PRN

## 2013-09-30 ENCOUNTER — Encounter: Payer: Self-pay | Admitting: Adult Health

## 2013-09-30 ENCOUNTER — Encounter: Payer: 59 | Admitting: Cardiology

## 2013-09-30 ENCOUNTER — Ambulatory Visit (INDEPENDENT_AMBULATORY_CARE_PROVIDER_SITE_OTHER): Payer: 59 | Admitting: Adult Health

## 2013-09-30 VITALS — BP 130/72 | HR 78 | Temp 98.4°F | Wt 251.0 lb

## 2013-09-30 DIAGNOSIS — Z79899 Other long term (current) drug therapy: Secondary | ICD-10-CM

## 2013-09-30 DIAGNOSIS — Z113 Encounter for screening for infections with a predominantly sexual mode of transmission: Secondary | ICD-10-CM

## 2013-09-30 MED ORDER — METFORMIN HCL 1000 MG PO TABS: ORAL_TABLET | ORAL | Status: DC

## 2013-09-30 NOTE — Progress Notes (Signed)
Patient ID: Jesse Vargas, male   DOB: [birth year], 58 y.o.   MRN: [id number]    Subjective:    Patient ID: Jesse Vargas, male    DOB: 27-May-1956, 58 y.o.   MRN: [id number]  HPI  Pt is a 57 y/o male who presents to clinic for refills of metformin and to discuss STD screening. He reports that his wife tested positive for herpes. He explains that they have an open marriage and both have other partners.    Past Medical History  Diagnosis Date  . Diabetes mellitus   . Hypertension   . Cancer of kidney -status post nephrectomy     Kidney cancer 1999  . Hyperlipidemia   . Sleep apnea   . SVT (supraventricular tachycardia)     Documentation pending but occurring during the stress test with Dr. 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 GERD (gastroesophageal reflux disease)   . Family history of long QT syndrome     Current Outpatient Prescriptions on File Prior to Visit  Medication Sig Dispense Refill  . albuterol (PROAIR HFA) 108 (90 BASE) MCG/ACT inhaler Inhale 2 puffs into the lungs 4 (four) times daily as needed for wheezing.  9 g  1  . amLODipine (NORVASC) 5 MG tablet Take 5 mg by mouth daily.      Marland Kitchen aspirin 325 MG EC tablet Take 325 mg by mouth daily.      . carvedilol (COREG) 12.5 MG tablet Take 1 tablet (12.5 mg total) by mouth 2 (two) times daily.  60 tablet  12  . citalopram (CELEXA) 40 MG tablet Take 1 tablet by mouth  daily  90 tablet  0  . cyclobenzaprine (FLEXERIL) 10 MG tablet Take 1 tablet at bedtime as needed for muscle spasms  30 tablet  0  . fenofibrate 160 MG tablet Take 1 tablet (160 mg total) by mouth daily.  90 tablet  3  . fluticasone (FLONASE) 50 MCG/ACT nasal spray Place 2 sprays into both nostrils daily.      Marland Kitchen lisinopril-hydrochlorothiazide (PRINZIDE,ZESTORETIC) 20-25 MG per tablet Take 1 tablet by mouth daily.      . meloxicam (MOBIC) 15 MG tablet Take 1 tablet (15 mg total) by mouth daily.  90 tablet  3  . methylphenidate (RITALIN) 10 MG  tablet Take 1 tablet (10 mg total) by mouth 3 (three) times daily.  90 tablet  0  . NON FORMULARY Take 1 mL by mouth once a week. Nugenix-- Testosterone Booster      . oxyCODONE-acetaminophen (PERCOCET) 10-325 MG per tablet Take 1 tablet by mouth every 4 (four) hours as needed for pain.      . pantoprazole (PROTONIX) 20 MG tablet Take 1 tablet (20 mg total) by mouth 2 (two) times daily.  90 tablet  3  . pravastatin (PRAVACHOL) 10 MG tablet Take 1 tablet by mouth  daily  90 tablet  3  . tadalafil (CIALIS) 5 MG tablet Take 1 tablet (5 mg total) by mouth daily as needed for erectile dysfunction. 1 po qd  10 tablet  0  . traMADol (ULTRAM) 50 MG tablet Take 1 tablet (50 mg total) by mouth 3 (three) times daily as needed for pain.  120 tablet  0   No current facility-administered medications on file prior to visit.     Review of Systems  Constitutional: Negative.   Respiratory: Negative.   Cardiovascular: Negative.   Genitourinary: Negative.  Negative for dysuria, hematuria and genital  sores.  Neurological: Negative.   Psychiatric/Behavioral: Negative.   All other systems reviewed and are negative.       Objective:  BP 130/72  Pulse 78  Temp(Src) 98.4 F (36.9 C) (Oral)  Wt 251 lb (113.853 kg)  SpO2 96%   Physical Exam  Constitutional: He is oriented to person, place, and time.  Overweight 58 y/o male in NAD  HENT:  Head: Normocephalic and atraumatic.  Cardiovascular: Normal rate and regular rhythm.   Pulmonary/Chest: Effort normal. No respiratory distress.  Musculoskeletal: Normal range of motion.  Neurological: He is alert and oriented to person, place, and time.  Psychiatric: He has a normal mood and affect. His behavior is normal. Judgment and thought content normal.       Assessment & Plan:   1. Screen for STD (sexually transmitted disease) Pt requesting testing for herpes. Given his sexual practices recommend that we do a more comprehensive STD workup including  gonorrhea/chlamydia, syphilis, herpes, HIV. He is in agreement. Pt will have these done at [facility].  2. Medication Management Refill for metformin sent to pharmacy. Check A1c. Continue to follow.

## 2013-09-30 NOTE — Progress Notes (Signed)
Pre visit review using our clinic review tool, if applicable. No additional management support is needed unless otherwise documented below in the visit note. 

## 2013-10-07 ENCOUNTER — Encounter: Payer: 59 | Admitting: Internal Medicine

## 2013-10-08 ENCOUNTER — Other Ambulatory Visit: Payer: Self-pay | Admitting: Adult Health

## 2013-10-08 MED ORDER — METFORMIN HCL 1000 MG PO TABS: ORAL_TABLET | ORAL | Status: DC

## 2013-10-12 ENCOUNTER — Other Ambulatory Visit: Payer: Self-pay | Admitting: Internal Medicine

## 2013-10-15 ENCOUNTER — Telehealth: Payer: Self-pay | Admitting: Adult Health

## 2013-10-15 NOTE — Telephone Encounter (Signed)
A1c results 6.7%

## 2013-10-16 ENCOUNTER — Other Ambulatory Visit: Payer: Self-pay

## 2013-10-21 ENCOUNTER — Other Ambulatory Visit: Payer: Self-pay | Admitting: Adult Health

## 2013-10-21 ENCOUNTER — Telehealth: Payer: Self-pay | Admitting: Adult Health

## 2013-10-21 DIAGNOSIS — F988 Other specified behavioral and emotional disorders with onset usually occurring in childhood and adolescence: Secondary | ICD-10-CM

## 2013-10-21 MED ORDER — METHYLPHENIDATE HCL 10 MG PO TABS: 10.0000 mg | ORAL_TABLET | Freq: Three times a day (TID) | ORAL | Status: DC

## 2013-10-21 NOTE — Telephone Encounter (Signed)
I recently saw the patient so I will go ahead and print prescriptions. He will need to pick them up.

## 2013-10-21 NOTE — Telephone Encounter (Signed)
Pt asking if he can have Ritalin prescriptions x 3 months postdated.  Pt would like to send to pharmacy to have on file.  Ritalin 10 mg 3x daily #90.

## 2013-10-21 NOTE — Progress Notes (Signed)
Ritalin prescriptions x 3 months  April, May, June

## 2013-10-22 NOTE — Telephone Encounter (Signed)
Rx is up front ready for pick up.  

## 2013-10-31 ENCOUNTER — Telehealth: Payer: Self-pay | Admitting: Adult Health

## 2013-10-31 NOTE — Telephone Encounter (Signed)
10/31/13 called for labs

## 2013-10-31 NOTE — Telephone Encounter (Signed)
Labs given to Raquel

## 2013-10-31 NOTE — Telephone Encounter (Signed)
Called [facility] for copy of results, dated 10/13/13

## 2013-10-31 NOTE — Telephone Encounter (Signed)
The patient is wanting his STD lab results. They were done at [facility].

## 2013-11-02 ENCOUNTER — Telehealth: Payer: Self-pay | Admitting: Adult Health

## 2013-11-02 NOTE — Telephone Encounter (Signed)
A1c results

## 2013-11-02 NOTE — Telephone Encounter (Signed)
Sent pt message regarding STD testing

## 2013-11-18 ENCOUNTER — Ambulatory Visit: Payer: 59 | Admitting: Adult Health

## 2013-11-19 ENCOUNTER — Telehealth: Payer: Self-pay | Admitting: Adult Health

## 2013-11-19 NOTE — Telephone Encounter (Signed)
methylphenidate (RITALIN) 10 MG tablet     The patient is wanting a 90 Day supply of this medication.  He is wanting 30 days at a time with the different dates on the prescription as to what time they can be refilled.

## 2013-11-19 NOTE — Telephone Encounter (Signed)
Called patient. No answer. Voicemail left with call back number.

## 2013-11-20 ENCOUNTER — Encounter: Payer: Self-pay | Admitting: Adult Health

## 2013-11-26 ENCOUNTER — Ambulatory Visit (INDEPENDENT_AMBULATORY_CARE_PROVIDER_SITE_OTHER): Payer: 59 | Admitting: Adult Health

## 2013-11-26 ENCOUNTER — Encounter: Payer: Self-pay | Admitting: Adult Health

## 2013-11-26 VITALS — BP 144/82 | HR 61 | Temp 97.9°F | Resp 14 | Ht 70.0 in | Wt 248.0 lb

## 2013-11-26 DIAGNOSIS — F988 Other specified behavioral and emotional disorders with onset usually occurring in childhood and adolescence: Secondary | ICD-10-CM

## 2013-11-26 DIAGNOSIS — Z113 Encounter for screening for infections with a predominantly sexual mode of transmission: Secondary | ICD-10-CM

## 2013-11-26 DIAGNOSIS — Z0000 Encounter for general adult medical examination without abnormal findings: Secondary | ICD-10-CM

## 2013-11-26 MED ORDER — FENOFIBRATE 160 MG PO TABS: 160.0000 mg | ORAL_TABLET | Freq: Every day | ORAL | Status: DC

## 2013-11-26 MED ORDER — GABAPENTIN 300 MG PO CAPS: 300.0000 mg | ORAL_CAPSULE | Freq: Three times a day (TID) | ORAL | Status: DC

## 2013-11-26 MED ORDER — CITALOPRAM HYDROBROMIDE 40 MG PO TABS: ORAL_TABLET | ORAL | Status: DC

## 2013-11-26 NOTE — Progress Notes (Signed)
Patient ID: Jesse Vargas, male   DOB: 06-26-1956, 58 y.o.   MRN: [id number]    Subjective:    Patient ID: Jesse Vargas, male    DOB: 1955-11-27, 58 y.o.   MRN: [id number]  HPI  Pt is a 58 y/o male who presents to clinic for his annual physical exam. Needs refills on medications. He recently requested STD screening all of which were done at [facility]. He reports that they did not test him for herpes. He is most interested in being tested for this.  History of low testosterone followed by Urology every 6 months.  Reports that he sent 2 prescriptions for Ritalin to [facility]. He filled one locally. [facility] is claiming they did not receive 2 prescriptions. He will need to contact [facility] to try to work this out with them. I have suggested that he hold on to each prescription and fill this medication locally.    Past Medical History  Diagnosis Date  . Diabetes mellitus   . Hypertension   . Cancer of kidney -status post nephrectomy     Kidney cancer 1999  . Hyperlipidemia   . Sleep apnea   . SVT (supraventricular tachycardia)     Documentation pending but occurring during the stress test with Dr. 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 GERD (gastroesophageal reflux disease)   . Family history of long QT syndrome      Past Surgical History  Procedure Laterality Date  . Uvulopalatopharyngoplasty      08/1988  . Tonsillectomy      08/1988  . Cholecystectomy      2000  . Nephrectomy      L---1999 Cancer  . Nasal sinus surgery      2005  . Elbow surgery      left  . Inner ear surgery      R ear  . Ablation  08/27/13    AVNRT ablatin by Dr Lovena Le     Family History  Problem Relation Age of Onset  . Diabetes Father   . Hypertension Father   . Hyperlipidemia Father   . Colon polyps Father   . Hypertension Mother   . Diabetes Mother   . Colon cancer Neg Hx   . Stomach cancer Neg Hx      History   Social History  . Marital Status: Married    Spouse  Name: Pamala Hurry    Number of Children: N/A  . Years of Education: N/A   Occupational History  . 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 Smoking status: Former Smoker -- 2.00 packs/day for 25 years    Types: Cigarettes    Quit date: 03/20/1998  . Smokeless tobacco: Never Used  . Alcohol Use: Yes     Comment: rare  . Drug Use: No  . Sexual Activity: Yes    Partners: Female, Male     Comment: swinger   Other Topics Concern  . Not on file   Social History Narrative   Exercise--- walking at work 1 mile      Current Outpatient Prescriptions on File Prior to Visit  Medication Sig Dispense Refill  . albuterol (PROAIR HFA) 108 (90 BASE) MCG/ACT inhaler Inhale 2 puffs into the lungs 4 (four) times daily as needed for wheezing.  9 g  1  . amLODipine (NORVASC) 5 MG tablet Take 1/2 tablet by mouth  two times daily  90 tablet  0  .  aspirin 325 MG EC tablet Take 325 mg by mouth daily.      . carvedilol (COREG) 12.5 MG tablet Take 1 tablet (12.5 mg total) by mouth 2 (two) times daily.  60 tablet  12  . cyclobenzaprine (FLEXERIL) 10 MG tablet Take 1 tablet at bedtime as needed for muscle spasms  30 tablet  0  . fluticasone (FLONASE) 50 MCG/ACT nasal spray Place 2 sprays into both nostrils daily.      Marland Kitchen lisinopril-hydrochlorothiazide (PRINZIDE,ZESTORETIC) 20-25 MG per tablet Take 1 tablet by mouth daily.      . meloxicam (MOBIC) 15 MG tablet Take 1 tablet (15 mg total) by mouth daily.  90 tablet  3  . metFORMIN (GLUCOPHAGE) 1000 MG tablet Take 1500 mg with breakfast and 1000 mg with dinner. Do not exceed 2500 mg per day  225 tablet  3  . methylphenidate (RITALIN) 10 MG tablet Take 1 tablet (10 mg total) by mouth 3 (three) times daily.  90 tablet  0  . NON FORMULARY Take 1 mL by mouth once a week. Nugenix-- Testosterone Booster      . oxyCODONE-acetaminophen (PERCOCET) 10-325 MG per tablet Take 1 tablet by mouth every 4 (four) hours as needed for pain.      . pantoprazole  (PROTONIX) 20 MG tablet Take 1 tablet (20 mg total) by mouth 2 (two) times daily.  90 tablet  3  . pravastatin (PRAVACHOL) 10 MG tablet Take 1 tablet by mouth  daily  90 tablet  3  . tadalafil (CIALIS) 5 MG tablet Take 1 tablet (5 mg total) by mouth daily as needed for erectile dysfunction. 1 po qd  10 tablet  0  . traMADol (ULTRAM) 50 MG tablet Take 1 tablet (50 mg total) by mouth 3 (three) times daily as needed for pain.  120 tablet  0   No current facility-administered medications on file prior to visit.     Review of Systems  Constitutional: Negative.   HENT: Negative.   Eyes: Negative.   Respiratory: Negative.   Cardiovascular: Negative.   Gastrointestinal: Negative.   Endocrine: Negative.   Genitourinary: Negative.   Musculoskeletal: Negative.   Skin: Negative.   Allergic/Immunologic: Negative.   Neurological: Negative.   Hematological: Negative.   Psychiatric/Behavioral: Negative.        Objective:  BP 144/82  Pulse 61  Temp(Src) 97.9 F (36.6 C) (Oral)  Resp 14  Ht 5\' 10"  (1.778 m)  Wt 248 lb (112.492 kg)  BMI 35.58 kg/m2  SpO2 97%   Physical Exam  Constitutional: He is oriented to person, place, and time. No distress.  Overweight 58 y/o male  HENT:  Head: Normocephalic and atraumatic.  Right Ear: External ear normal.  Left Ear: External ear normal.  Nose: Nose normal.  Mouth/Throat: Oropharynx is clear and moist.  Eyes: Conjunctivae and EOM are normal. Pupils are equal, round, and reactive to light.  Neck: Normal range of motion. Neck supple. No tracheal deviation present. No thyromegaly present.  Cardiovascular: Normal rate, regular rhythm, normal heart sounds and intact distal pulses.  Exam reveals no gallop and no friction rub.   No murmur heard. Pulmonary/Chest: Effort normal and breath sounds normal. No respiratory distress. He has no wheezes. He has no rales.  Abdominal: Soft. Bowel sounds are normal. He exhibits no distension and no mass. There is  no tenderness. There is no rebound and no guarding.  Genitourinary:  Exam defered. Followed by urology.  Musculoskeletal: Normal range of motion. He  exhibits no edema and no tenderness.  Lymphadenopathy:    He has no cervical adenopathy.  Neurological: He is alert and oriented to person, place, and time. He has normal reflexes. No cranial nerve deficit. Coordination normal.  Skin: Skin is warm and dry.  Psychiatric: He has a normal mood and affect. His behavior is normal. Judgment and thought content normal.      Assessment & Plan:   1. Routine physical examination Normal physical exam. Labs ordered and he will have these done at [facility]. Medications refilled. Followed by urology, cardiology  2. Screen for STD (sexually transmitted disease) Herpes screen. Will have this done at [place]  3. Attention deficit disorder Pt was provided with 3 scripts for Ritalin. He reports that his pharmacy ([facility]) has misplaced one of the scripts. Recommend that he fill the ritalin with local pharmacy. Recommend that he contact [facility] and try to figure out what they did with his prescription.

## 2013-11-26 NOTE — Progress Notes (Signed)
Pre visit review using our clinic review tool, if applicable. No additional management support is needed unless otherwise documented below in the visit note. 

## 2013-11-26 NOTE — Patient Instructions (Signed)
  You had your annual physical exam today.  Refills of medications sent to [facility]  Please have labs done at your earliest convenience. I am checking your cholesterol so you will need to be fasting.  Recommend healthy diet and increase physical activity to help drop some weight.

## 2013-12-03 ENCOUNTER — Telehealth: Payer: Self-pay | Admitting: Adult Health

## 2013-12-03 ENCOUNTER — Other Ambulatory Visit: Payer: Self-pay | Admitting: Adult Health

## 2013-12-03 MED ORDER — PRAVASTATIN SODIUM 20 MG PO TABS: 20.0000 mg | ORAL_TABLET | Freq: Every day | ORAL | Status: DC

## 2013-12-03 NOTE — Telephone Encounter (Signed)
Labs

## 2013-12-05 ENCOUNTER — Telehealth: Payer: Self-pay | Admitting: Adult Health

## 2013-12-05 NOTE — Telephone Encounter (Signed)
Pt has not read his MyChart message with instructions. Tonika to call pt.

## 2013-12-05 NOTE — Telephone Encounter (Signed)
Called patient related to unread Mychart message. Notified him of Raquel's comments and change in medication dose. Patient verbalized understanding.

## 2013-12-12 ENCOUNTER — Other Ambulatory Visit: Payer: Self-pay | Admitting: *Deleted

## 2013-12-12 NOTE — Telephone Encounter (Signed)
Patient requests a 30 day refill on lisinopril/hctz which is normally refilled by his pcp, but they are closed today. He has ordered it through his mail-order pharmacy, but he will be out before that arrives. Ok to refill? Please advise. Thanks, MI

## 2013-12-16 ENCOUNTER — Other Ambulatory Visit: Payer: Self-pay

## 2013-12-16 ENCOUNTER — Telehealth: Payer: Self-pay | Admitting: Adult Health

## 2013-12-16 MED ORDER — LISINOPRIL-HYDROCHLOROTHIAZIDE 20-25 MG PO TABS: 1.0000 | ORAL_TABLET | Freq: Every day | ORAL | Status: DC

## 2013-12-16 NOTE — Telephone Encounter (Signed)
Needing a 30 day supply of lisinopril-hydrochlorothiazide (PRINZIDE,ZESTORETIC) 20-25 MG per tablet    called into [facility] [address]. The patient is completley out of his medication his blood pressure is 190/100. He has not taken his blood pressure medication since Friday 6.5.15. Please call patient when it has been sent. His mail order has not arrived.  lisinopril-hydrochlorothiazide (PRINZIDE,ZESTORETIC) 20-25 MG per tablet

## 2013-12-16 NOTE — Telephone Encounter (Signed)
Medication sent to pharmacy and patient was notified.

## 2013-12-17 ENCOUNTER — Telehealth: Payer: Self-pay | Admitting: Internal Medicine

## 2013-12-17 NOTE — Telephone Encounter (Signed)
Received call from patient.He stated B/P has been elevated.Stated he ran out of B/P medications but restarted B/P yesterday.Stated he is taking amlodipine 5 mg daily,carvedilol 12.5 mg twice a day,lisinopril/hctz 20/25 mg 1 or 2 daily.Stated his B/P has been elevated before he ran out of medications.Pulse ranging 66 to 77.Message sent to Dr.Ross for advice.

## 2013-12-17 NOTE — Telephone Encounter (Signed)
New message   Patient calling with blood pressure going up / down.  Keep getting headache.   Patient stated he run out of medication over the weekend.   Patient stated his PCP was not in the office on Friday.   Today 162/88  6/9 167/86 @ 12:54  6/9 171/87 @ 9:03 am  6/9 189/92 @ 8:07 am  6/9 190/100 @ 7:23 am  6/1 149/87 5/28 162/91 5/26 138/81

## 2013-12-17 NOTE — Telephone Encounter (Signed)
Returned call to patient no answer.LMTC. 

## 2013-12-18 ENCOUNTER — Encounter (HOSPITAL_COMMUNITY): Payer: Self-pay | Admitting: Emergency Medicine

## 2013-12-18 ENCOUNTER — Inpatient Hospital Stay (HOSPITAL_COMMUNITY)
Admission: EM | Admit: 2013-12-18 | Discharge: 2013-12-20 | DRG: 287 | Disposition: A | Discharge status: Home / Self Care | Payer: 59 | Attending: Internal Medicine | Admitting: Internal Medicine

## 2013-12-18 ENCOUNTER — Emergency Department (HOSPITAL_COMMUNITY): Payer: 59

## 2013-12-18 DIAGNOSIS — E119 Type 2 diabetes mellitus without complications: Secondary | ICD-10-CM | POA: Diagnosis present

## 2013-12-18 DIAGNOSIS — M797 Fibromyalgia: Secondary | ICD-10-CM

## 2013-12-18 DIAGNOSIS — Z905 Acquired absence of kidney: Secondary | ICD-10-CM

## 2013-12-18 DIAGNOSIS — Z85528 Personal history of other malignant neoplasm of kidney: Secondary | ICD-10-CM

## 2013-12-18 DIAGNOSIS — R5381 Other malaise: Secondary | ICD-10-CM

## 2013-12-18 DIAGNOSIS — G4730 Sleep apnea, unspecified: Secondary | ICD-10-CM | POA: Diagnosis present

## 2013-12-18 DIAGNOSIS — I2584 Coronary atherosclerosis due to calcified coronary lesion: Secondary | ICD-10-CM | POA: Diagnosis present

## 2013-12-18 DIAGNOSIS — R531 Weakness: Secondary | ICD-10-CM | POA: Insufficient documentation

## 2013-12-18 DIAGNOSIS — E669 Obesity, unspecified: Secondary | ICD-10-CM

## 2013-12-18 DIAGNOSIS — I208 Other forms of angina pectoris: Secondary | ICD-10-CM

## 2013-12-18 DIAGNOSIS — K219 Gastro-esophageal reflux disease without esophagitis: Secondary | ICD-10-CM | POA: Diagnosis present

## 2013-12-18 DIAGNOSIS — Z833 Family history of diabetes mellitus: Secondary | ICD-10-CM

## 2013-12-18 DIAGNOSIS — I10 Essential (primary) hypertension: Secondary | ICD-10-CM

## 2013-12-18 DIAGNOSIS — G4733 Obstructive sleep apnea (adult) (pediatric): Secondary | ICD-10-CM

## 2013-12-18 DIAGNOSIS — Z8249 Family history of ischemic heart disease and other diseases of the circulatory system: Secondary | ICD-10-CM

## 2013-12-18 DIAGNOSIS — Z6835 Body mass index (BMI) 35.0-35.9, adult: Secondary | ICD-10-CM

## 2013-12-18 DIAGNOSIS — E1121 Type 2 diabetes mellitus with diabetic nephropathy: Secondary | ICD-10-CM | POA: Diagnosis present

## 2013-12-18 DIAGNOSIS — Z87891 Personal history of nicotine dependence: Secondary | ICD-10-CM

## 2013-12-18 DIAGNOSIS — R079 Chest pain, unspecified: Secondary | ICD-10-CM

## 2013-12-18 DIAGNOSIS — F411 Generalized anxiety disorder: Secondary | ICD-10-CM

## 2013-12-18 DIAGNOSIS — I2510 Atherosclerotic heart disease of native coronary artery without angina pectoris: Principal | ICD-10-CM

## 2013-12-18 DIAGNOSIS — I471 Supraventricular tachycardia, unspecified: Secondary | ICD-10-CM | POA: Diagnosis present

## 2013-12-18 DIAGNOSIS — R5383 Other fatigue: Secondary | ICD-10-CM

## 2013-12-18 DIAGNOSIS — E785 Hyperlipidemia, unspecified: Secondary | ICD-10-CM | POA: Diagnosis present

## 2013-12-18 DIAGNOSIS — I2089 Other forms of angina pectoris: Secondary | ICD-10-CM

## 2013-12-18 DIAGNOSIS — I209 Angina pectoris, unspecified: Secondary | ICD-10-CM

## 2013-12-18 HISTORY — DX: Personal history of other medical treatment: Z92.89

## 2013-12-18 HISTORY — DX: Atherosclerotic heart disease of native coronary artery without angina pectoris: I25.10

## 2013-12-18 LAB — CBC
HCT: 45.8 % (ref 39.0–52.0)
Hemoglobin: 15.9 g/dL (ref 13.0–17.0)
MCH: 30.7 pg (ref 26.0–34.0)
MCHC: 34.7 g/dL (ref 30.0–36.0)
MCV: 88.4 fL (ref 78.0–100.0)
PLATELETS: 175 10*3/uL (ref 150–400)
RBC: 5.18 MIL/uL (ref 4.22–5.81)
RDW: 13.6 % (ref 11.5–15.5)
WBC: 9.5 10*3/uL (ref 4.0–10.5)

## 2013-12-18 LAB — I-STAT TROPONIN, ED
TROPONIN I, POC: 0.02 ng/mL (ref 0.00–0.08)
Troponin i, poc: 0.01 ng/mL (ref 0.00–0.08)

## 2013-12-18 LAB — BASIC METABOLIC PANEL
BUN: 14 mg/dL (ref 6–23)
CALCIUM: 10.3 mg/dL (ref 8.4–10.5)
CO2: 28 meq/L (ref 19–32)
CREATININE: 0.84 mg/dL (ref 0.50–1.35)
Chloride: 98 mEq/L (ref 96–112)
Glucose, Bld: 100 mg/dL — ABNORMAL HIGH (ref 70–99)
Potassium: 3.8 mEq/L (ref 3.7–5.3)
SODIUM: 141 meq/L (ref 137–147)

## 2013-12-18 LAB — HEPARIN LEVEL (UNFRACTIONATED)

## 2013-12-18 LAB — PRO B NATRIURETIC PEPTIDE: PRO B NATRI PEPTIDE: 99.3 pg/mL (ref 0–125)

## 2013-12-18 LAB — MRSA PCR SCREENING: MRSA by PCR: NEGATIVE

## 2013-12-18 LAB — TSH: TSH: 2.24 u[IU]/mL (ref 0.350–4.500)

## 2013-12-18 LAB — TROPONIN I

## 2013-12-18 MED ORDER — HEPARIN BOLUS VIA INFUSION
4000.0000 [IU] | Freq: Once | INTRAVENOUS | Status: AC
  Administered 2013-12-18: 4000 [IU] via INTRAVENOUS
  Filled 2013-12-18: qty 4000

## 2013-12-18 MED ORDER — SODIUM CHLORIDE 0.9 % IJ SOLN: 3.0000 mL | Freq: Two times a day (BID) | INTRAMUSCULAR | Status: DC

## 2013-12-18 MED ORDER — SODIUM CHLORIDE 0.9 % IV SOLN: 250.0000 mL | INTRAVENOUS | Status: DC | PRN

## 2013-12-18 MED ORDER — GABAPENTIN 300 MG PO CAPS
300.0000 mg | ORAL_CAPSULE | Freq: Three times a day (TID) | ORAL | Status: DC
  Administered 2013-12-18 – 2013-12-20 (×4): 300 mg via ORAL
  Filled 2013-12-18 (×8): qty 1

## 2013-12-18 MED ORDER — FENOFIBRATE 160 MG PO TABS
160.0000 mg | ORAL_TABLET | Freq: Every day | ORAL | Status: DC
  Administered 2013-12-19 – 2013-12-20 (×2): 160 mg via ORAL
  Filled 2013-12-18 (×3): qty 1

## 2013-12-18 MED ORDER — ONDANSETRON HCL 4 MG/2ML IJ SOLN
4.0000 mg | Freq: Once | INTRAMUSCULAR | Status: AC
  Administered 2013-12-18: 4 mg via INTRAVENOUS
  Filled 2013-12-18: qty 2

## 2013-12-18 MED ORDER — ASPIRIN EC 81 MG PO TBEC
81.0000 mg | DELAYED_RELEASE_TABLET | Freq: Every day | ORAL | Status: DC
  Administered 2013-12-18 – 2013-12-20 (×3): 81 mg via ORAL
  Filled 2013-12-18 (×3): qty 1

## 2013-12-18 MED ORDER — NITROGLYCERIN 0.4 MG SL SUBL: 0.4000 mg | SUBLINGUAL_TABLET | SUBLINGUAL | Status: DC | PRN

## 2013-12-18 MED ORDER — METHYLPHENIDATE HCL 5 MG PO TABS: 10.0000 mg | ORAL_TABLET | Freq: Three times a day (TID) | ORAL | Status: DC

## 2013-12-18 MED ORDER — TRAMADOL HCL 50 MG PO TABS
50.0000 mg | ORAL_TABLET | Freq: Two times a day (BID) | ORAL | Status: DC | PRN
  Administered 2013-12-19: 50 mg via ORAL
  Filled 2013-12-18: qty 1

## 2013-12-18 MED ORDER — SODIUM CHLORIDE 0.9 % IJ SOLN: 3.0000 mL | INTRAMUSCULAR | Status: DC | PRN

## 2013-12-18 MED ORDER — SODIUM CHLORIDE 0.9 % IV SOLN
1.0000 mL/kg/h | INTRAVENOUS | Status: DC
  Administered 2013-12-19: 1 mL/kg/h via INTRAVENOUS

## 2013-12-18 MED ORDER — HEPARIN (PORCINE) IN NACL 100-0.45 UNIT/ML-% IJ SOLN
1850.0000 [IU]/h | INTRAMUSCULAR | Status: DC
  Administered 2013-12-18: 1250 [IU]/h via INTRAVENOUS
  Administered 2013-12-19: 1550 [IU]/h via INTRAVENOUS
  Filled 2013-12-18 (×4): qty 250

## 2013-12-18 MED ORDER — MORPHINE SULFATE 4 MG/ML IJ SOLN
4.0000 mg | Freq: Once | INTRAMUSCULAR | Status: AC
  Administered 2013-12-18: 4 mg via INTRAVENOUS
  Filled 2013-12-18: qty 1

## 2013-12-18 MED ORDER — ALBUTEROL SULFATE (2.5 MG/3ML) 0.083% IN NEBU: 3.0000 mL | INHALATION_SOLUTION | Freq: Four times a day (QID) | RESPIRATORY_TRACT | Status: DC | PRN

## 2013-12-18 MED ORDER — SIMVASTATIN 10 MG PO TABS
10.0000 mg | ORAL_TABLET | Freq: Every day | ORAL | Status: DC
  Administered 2013-12-18: 10 mg via ORAL
  Filled 2013-12-18 (×2): qty 1

## 2013-12-18 MED ORDER — HEPARIN BOLUS VIA INFUSION
2500.0000 [IU] | Freq: Once | INTRAVENOUS | Status: AC
  Administered 2013-12-18: 2500 [IU] via INTRAVENOUS
  Filled 2013-12-18: qty 2500

## 2013-12-18 MED ORDER — OXYCODONE HCL 5 MG PO TABS
5.0000 mg | ORAL_TABLET | Freq: Four times a day (QID) | ORAL | Status: DC | PRN
Start: 2013-12-18 — End: 2013-12-20
  Administered 2013-12-18 – 2013-12-19 (×4): 5 mg via ORAL
  Filled 2013-12-18 (×4): qty 1

## 2013-12-18 MED ORDER — NITROGLYCERIN IN D5W 200-5 MCG/ML-% IV SOLN
2.0000 ug/min | INTRAVENOUS | Status: DC
  Administered 2013-12-18: 10 ug/min via INTRAVENOUS
  Filled 2013-12-18: qty 250

## 2013-12-18 MED ORDER — CITALOPRAM HYDROBROMIDE 40 MG PO TABS
40.0000 mg | ORAL_TABLET | Freq: Every day | ORAL | Status: DC
  Administered 2013-12-18 – 2013-12-20 (×3): 40 mg via ORAL
  Filled 2013-12-18 (×3): qty 1

## 2013-12-18 MED ORDER — INSULIN ASPART 100 UNIT/ML ~~LOC~~ SOLN
0.0000 [IU] | Freq: Three times a day (TID) | SUBCUTANEOUS | Status: DC
  Administered 2013-12-19: 2 [IU] via SUBCUTANEOUS

## 2013-12-18 MED ORDER — CARVEDILOL 12.5 MG PO TABS
12.5000 mg | ORAL_TABLET | Freq: Two times a day (BID) | ORAL | Status: DC
  Administered 2013-12-18 – 2013-12-20 (×3): 12.5 mg via ORAL
  Filled 2013-12-18 (×5): qty 1

## 2013-12-18 MED ORDER — METHYLPHENIDATE HCL 10 MG PO TABS: 10.0000 mg | ORAL_TABLET | Freq: Three times a day (TID) | ORAL | Status: DC

## 2013-12-18 MED ORDER — PANTOPRAZOLE SODIUM 20 MG PO TBEC
20.0000 mg | DELAYED_RELEASE_TABLET | Freq: Two times a day (BID) | ORAL | Status: DC
  Administered 2013-12-18 – 2013-12-20 (×4): 20 mg via ORAL
  Filled 2013-12-18 (×6): qty 1

## 2013-12-18 MED ORDER — ASPIRIN 81 MG PO CHEW
81.0000 mg | CHEWABLE_TABLET | ORAL | Status: AC
  Administered 2013-12-19: 81 mg via ORAL
  Filled 2013-12-18: qty 1

## 2013-12-18 MED ORDER — OXYCODONE-ACETAMINOPHEN 5-325 MG PO TABS
1.0000 | ORAL_TABLET | Freq: Four times a day (QID) | ORAL | Status: DC | PRN
  Administered 2013-12-18 – 2013-12-19 (×3): 1 via ORAL
  Filled 2013-12-18 (×3): qty 1

## 2013-12-18 NOTE — ED Provider Notes (Signed)
Medical screening examination/treatment/procedure(s) were performed by non-physician practitioner and as supervising physician I was immediately available for consultation/collaboration.   EKG Interpretation   Date/Time:  Thursday December 18 2013 12:34:23 EDT Ventricular Rate:  67 PR Interval:  146 QRS Duration: 88 QT Interval:  404 QTC Calculation: 426 R Axis:   118 Text Interpretation:  Normal sinus rhythm Right axis deviation Right  ventricular hypertrophy Abnormal ECG Confirmed by WARD,  DO, KRISTEN  ([id number]) on 12/18/2013 12:52:08 PM        [place], DO 12/18/13 8299

## 2013-12-18 NOTE — Telephone Encounter (Signed)
Pt has tightness in his chest. I told pt he should probably go the the ER or an Urgent care to be seen today. Pt will let us know what he decides to do.

## 2013-12-18 NOTE — Telephone Encounter (Signed)
Error wife has already talked with someone & they are on their way to the ED Horton Chin RN

## 2013-12-18 NOTE — ED Notes (Signed)
Wife in bed with pt, instructed to please use chairs in the rm.

## 2013-12-18 NOTE — ED Notes (Signed)
Spoke with PA, would like to hold Heparin until after Cardiology sees pt.

## 2013-12-18 NOTE — ED Notes (Signed)
Cardiologist in to assess pt.  Wife remains at bedside.

## 2013-12-18 NOTE — ED Provider Notes (Signed)
CSN: [id number]     Arrival date & time 12/18/13  1227 History   First MD Initiated Contact with Patient 12/18/13 1244     Chief Complaint  Patient presents with  . Chest Pain  . Headache  . Hypertension     (Consider location/radiation/quality/duration/timing/severity/associated sxs/prior Treatment) HPI Cardiology: Dr. Harrington Challenger and Dr. Lovena Le  Patient to the ER with complaints of exertional angina and uncontrolled BP over the last two months but worse yesterday and today. He had a A-V node ablation in February of 2015. He reports that since then he has not felt completely normal and felt that his blood pressure has not been well controlled. Over the past 2 days he reports his blood pressure going higher than 086 systolic, having exertional angina, and feeling generally fatigued all the time. He also complains of having a recent weight gain of a few lbs which he most notices in his abdomen. Last cardiac cath was in 2004. He has had a stress test within the past couple of years but is unsure of what the results were.    Past Medical History  Diagnosis Date  . Diabetes mellitus   . Hypertension   . Cancer of kidney -status post nephrectomy     Kidney cancer 1999  . Hyperlipidemia   . Sleep apnea   . SVT (supraventricular tachycardia)     Documentation pending but occurring during the stress test with Dr. 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 GERD (gastroesophageal reflux disease)   . Family history of long QT syndrome    Past Surgical History  Procedure Laterality Date  . Uvulopalatopharyngoplasty      08/1988  . Tonsillectomy      08/1988  . Cholecystectomy      2000  . Nephrectomy      L---1999 Cancer  . Nasal sinus surgery      2005  . Elbow surgery      left  . Inner ear surgery      R ear  . Ablation  08/27/13    AVNRT ablatin by Dr Lovena Le   Family History  Problem Relation Age of Onset  . Diabetes Father   . Hypertension Father   . Hyperlipidemia Father    . Colon polyps Father   . Hypertension Mother   . Diabetes Mother   . Colon cancer Neg Hx   . Stomach cancer Neg Hx    History  Substance Use Topics  . Smoking status: Former Smoker -- 2.00 packs/day for 25 years    Types: Cigarettes    Quit date: 03/20/1998  . Smokeless tobacco: Never Used  . Alcohol Use: Yes     Comment: rare    Review of Systems   Review of Systems  Gen: no weight loss, fevers, chills, night sweats  Eyes: no discharge or drainage, no occular pain or visual changes  Nose: no epistaxis or rhinorrhea  Mouth: no dental pain, no sore throat  Neck: no neck pain  Lungs:No wheezing, coughing or hemoptysis + SOb with exertion CV: +chest pain, No palpitations, dependent edema or orthopnea  Abd: no abdominal pain, nausea, vomiting, diarrhea +distention GU: no dysuria or gross hematuria  MSK:  No muscle weakness or pain  Neuro: no headache, no focal neurologic deficits  Skin: no rash or wounds Psyche: no complaints    Allergies  Sulfonamide derivatives and Vicodin  Home Medications   Prior to Admission medications   Medication Sig Start Date End  Date Taking? Authorizing Provider  albuterol (PROAIR HFA) 108 (90 BASE) MCG/ACT inhaler Inhale 2 puffs into the lungs 4 (four) times daily as needed for wheezing. 07/07/13  Yes Raquel Rey, NP  aspirin EC 81 MG tablet Take 81 mg by mouth daily.   Yes Historical Provider, MD  carvedilol (COREG) 12.5 MG tablet Take 1 tablet (12.5 mg total) by mouth 2 (two) times daily. 09/03/13  Yes Fay Records, MD  citalopram (CELEXA) 40 MG tablet Take 1 tablet by mouth  daily 11/26/13  Yes Raquel Rey, NP  cyclobenzaprine (FLEXERIL) 10 MG tablet Take 1 tablet at bedtime as needed for muscle spasms 07/16/13  Yes Alferd Apa Lowne, DO  fenofibrate 160 MG tablet Take 1 tablet (160 mg total) by mouth daily. 11/26/13  Yes Raquel Rey, NP  fluticasone (FLONASE) 50 MCG/ACT nasal spray Place 2 sprays into both nostrils daily.   Yes Historical  Provider, MD  gabapentin (NEURONTIN) 300 MG capsule Take 1 capsule (300 mg total) by mouth 3 (three) times daily. 11/26/13  Yes Raquel Rey, NP  lisinopril-hydrochlorothiazide (PRINZIDE,ZESTORETIC) 20-25 MG per tablet Take 1 tablet by mouth daily. 12/16/13  Yes Raquel Rey, NP  meloxicam (MOBIC) 15 MG tablet Take 1 tablet (15 mg total) by mouth daily. 07/07/13  Yes Raquel Rey, NP  metFORMIN (GLUCOPHAGE) 1000 MG tablet Take 1500 mg with breakfast and 1000 mg with dinner. Do not exceed 2500 mg per day 10/08/13  Yes Raquel Rey, NP  methylphenidate (RITALIN) 10 MG tablet Take 1 tablet (10 mg total) by mouth 3 (three) times daily. 10/21/13 10/21/14 Yes Raquel Rey, NP  NON FORMULARY Take 1 mL by mouth once a week. Nugenix-- Testosterone Booster   Yes Historical Provider, MD  oxyCODONE-acetaminophen (PERCOCET) 10-325 MG per tablet Take 1 tablet by mouth every 4 (four) hours as needed for pain.   Yes Historical Provider, MD  pantoprazole (PROTONIX) 20 MG tablet Take 1 tablet (20 mg total) by mouth 2 (two) times daily. 05/15/13  Yes Raquel Rey, NP  pravastatin (PRAVACHOL) 20 MG tablet Take 1 tablet (20 mg total) by mouth daily. 12/03/13  Yes Raquel Rey, NP  tadalafil (CIALIS) 5 MG tablet Take 1 tablet (5 mg total) by mouth daily as needed for erectile dysfunction. 1 po qd 09/29/13  Yes Raquel Rey, NP  traMADol (ULTRAM) 50 MG tablet Take 1 tablet (50 mg total) by mouth 3 (three) times daily as needed for pain. 07/30/12  Yes [name], DO   BP 154/93  Pulse 66  Temp(Src) 98.4 F (36.9 C) (Oral)  Resp 14  Ht 5\' 10"  (1.778 m)  Wt 248 lb 8 oz (112.719 kg)  BMI 35.66 kg/m2  SpO2 96% Physical Exam  Nursing note and vitals reviewed. Constitutional: He appears well-developed and well-nourished. No distress.  HENT:  Head: Normocephalic and atraumatic.  Eyes: Pupils are equal, round, and reactive to light.  Neck: Normal range of motion. Neck supple.  Cardiovascular: Normal rate and regular rhythm.    Pulmonary/Chest: Effort normal and breath sounds normal. No accessory muscle usage. No respiratory distress.  Abdominal: Soft. Bowel sounds are normal.  I do not appreciate any distention or fluid wave to the patients abdomen. It is soft and non tender.  Musculoskeletal:  No lower extremity swelling  Neurological: He is alert.  Skin: Skin is warm and dry.    ED Course  Procedures (including critical care time) Labs Review Labs Reviewed  BASIC METABOLIC PANEL - Abnormal; Notable for the following:  Glucose, Bld 100 (*)    All other components within normal limits  CBC  PRO B NATRIURETIC PEPTIDE  I-STAT TROPOININ, ED    Imaging Review Dg Chest 2 View  12/18/2013   CLINICAL DATA:  Chest pain with history of hypertension  EXAM: CHEST  2 VIEW  COMPARISON:  PA and lateral chest x-ray of July 19, 2011  FINDINGS: The lungs are adequately inflated. There is no focal infiltrate nor parenchymal mass. The heart and mediastinal structures are normal. The bony structures exhibit mild degenerative disc change at multiple thoracic levels.  IMPRESSION: There is no acute cardiopulmonary abnormality.   Electronically Signed   By: David  Martinique   On: 12/18/2013 13:40     EKG Interpretation   Date/Time:  Thursday December 18 2013 12:34:23 EDT Ventricular Rate:  67 PR Interval:  146 QRS Duration: 88 QT Interval:  404 QTC Calculation: 426 R Axis:   118 Text Interpretation:  Normal sinus rhythm Right axis deviation Right  ventricular hypertrophy Abnormal ECG Confirmed by WARD,  DO, KRISTEN  ([id number]) on 12/18/2013 12:52:08 PM      MDM   Final diagnoses:  Exertional angina    Patients pain relieves with rest, therefore he is currently pain free since he is resting. He was given 4 mg IV Morphine and Zofran, reports taking two aspirin at home.  1:52 pm Discussed case with Dr. Leonides Schanz - His BNP and chest xray are not consistent with heart failure. His first troponin is negative. CBC and BMP are  unremarkable. I am concerned about patients unstable angina and feel that he needs to be admitted for further work-up and intervention. 1:59 pm I spoke with Cardiology PA who will update her team and they will come see patient. I had ordered a Heparin drip but asked Pharmacy to hold onto it until the patient has been seen by cardiology.  Linus Mako, PA-C 12/18/13 1432

## 2013-12-18 NOTE — ED Notes (Signed)
Patient with reported hypertension for 2 weeks. Patient reports headaches as well.  He states he has had intermittent chest pain for 2 months but worse x 2 days.  Patient has hx of stent in jan of this year.  Dr Harrington Challenger is his cardiologist.  Patient also reports he has had weight gain of 8 pounds recently in mid abdomen.  Patient takes aspirin bid, 81mg .

## 2013-12-18 NOTE — Progress Notes (Signed)
ANTICOAGULATION CONSULT NOTE - Follow Up Consult  Pharmacy Consult for Heparin Indication: chest pain/ACS  Allergies  Allergen Reactions  . Sulfonamide Derivatives Hives  . Vicodin [Hydrocodone-Acetaminophen] Rash    Patient Measurements: Height: 5\' 10"  (177.8 cm) Weight: 244 lb 7.8 oz (110.9 kg) IBW/kg (Calculated) : 73 Heparin Dosing Weight: 86 kg  Vital Signs: Temp: 98.3 F (36.8 C) (06/11 2038) Temp src: Oral (06/11 1845) BP: 163/101 mmHg (06/11 1900) Pulse Rate: 67 (06/11 1845)  Labs:  Recent Labs  12/18/13 1241 12/18/13 2115  HGB 15.9  --   HCT 45.8  --   PLT 175  --   HEPARINUNFRC  --  <0.10*  CREATININE 0.84  --     Estimated Creatinine Clearance: 122.5 ml/min (by C-G formula based on Cr of 0.84).   Assessment: 58 yo man with c/o exertional angina and uncontrolled BP over last 2 months. Initial heparin level <0.1 undetectable.  Goal of Therapy:  Heparin level 0.3-0.7 units/ml Monitor platelets by anticoagulation protocol: Yes   Plan:  Heparin 2500 unit IV bolus Increasing heparin infusion rate to 1550 units/hr  Jesse Vargas, PharmD, BCPS Clinical Staff Pharmacist Pager [phone]  Jesse Vargas 12/18/2013,9:59 PM

## 2013-12-18 NOTE — Telephone Encounter (Signed)
F/u   Pt's wife stated she need to talk to someone asap because of pt's bp. Pt's wife stated she was suppose to have gotten a call back yesterday but didn't and she was very upset because of it. Please call pt.

## 2013-12-18 NOTE — Telephone Encounter (Signed)
Patient was last in clinic in Winter 2014 Admitted for ablation this spring. Make appt for him to be seen in clinic.

## 2013-12-18 NOTE — Progress Notes (Signed)
ANTICOAGULATION CONSULT NOTE - Initial Consult  Pharmacy Consult for heparin Indication: USAP  Allergies  Allergen Reactions  . Sulfonamide Derivatives Hives  . Vicodin [Hydrocodone-Acetaminophen] Rash    Patient Measurements: Height: 5\' 10"  (177.8 cm) Weight: 248 lb 8 oz (112.719 kg) IBW/kg (Calculated) : 73 Heparin Dosing Weight: 86  Vital Signs: Temp: 98.4 F (36.9 C) (06/11 1254) Temp src: Oral (06/11 1254) BP: 154/93 mmHg (06/11 1254) Pulse Rate: 66 (06/11 1238)  Labs:  Recent Labs  12/18/13 1241  HGB 15.9  HCT 45.8  PLT 175  CREATININE 0.84    Estimated Creatinine Clearance: 123.5 ml/min (by C-G formula based on Cr of 0.84).   Medical History: Past Medical History  Diagnosis Date  . Diabetes mellitus   . Hypertension   . Cancer of kidney -status post nephrectomy     Kidney cancer 1999  . Hyperlipidemia   . Sleep apnea   . SVT (supraventricular tachycardia)     Documentation pending but occurring during the stress test with Dr. 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 GERD (gastroesophageal reflux disease)   . Family history of long QT syndrome     Medications:  See med history  Assessment: 58 yo man with c/o exertional angina and uncontrolled BP over last 2 months to start IV heparin for USAP.  His baseline CBC is normal Goal of Therapy:  Heparin level 0.3-0.7 units/ml Monitor platelets by anticoagulation protocol: Yes   Plan:  Heparin bolus 4000 units and drip at 1250 units/hr Check heparin level 6 hours after start Daily HL and CBC while on heparin  Thanks for allowing pharmacy to be a part of this patient's care.  Excell Seltzer, PharmD Clinical Pharmacist, [phone] 12/18/2013,2:10 PM

## 2013-12-18 NOTE — ED Notes (Signed)
Dinner tray ordered for pt

## 2013-12-18 NOTE — Telephone Encounter (Signed)
Spoke with pt and his BP was 200/120 when he got up and it has decreased to 199/100. Pt has taken BP medications and he is trying to get BP to go down. He states his wife is a Marine scientist and he will probably go to the ER/urgent care if BP doesn't go down. Pt tentively has an appt with Dr. Harrington Challenger tomorrow here is our office.

## 2013-12-18 NOTE — Progress Notes (Addendum)
Unit CM UR Completed by [facility] ED CM  W. Maliyah Willets RN  

## 2013-12-18 NOTE — Progress Notes (Signed)
Admission from the ED awake and alert. Denied any chest pain nor shortness of breath.

## 2013-12-18 NOTE — H&P (Signed)
Cardiologist:  Jesse Vargas is an 58 y.o. male.   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  He had a cath in 2007 which showed nonobstructive CAD-25% mid LAD, 50% distal LAD and 25% prox RCA.  His last echo was May 01, 2011-EF 60-65%.  He presents with chest pain which began in the last 3-4 days.  It feels like "rubber bands" squeezing around his chest with radiation to his back. It is worse when walking and better when not.  He gets very SOB with exertion and has for the last months.  He feels very tired.  He also reports his abdomen feels tight and he has gained ~9 pounds recently.  He wears a CPAP but sometimes feel wakes up feeling like he can not breath.  The patient currently denies nausea, vomiting, fever, orthopnea, dizziness, cough, congestion, hematochezia, melena, lower extremity edema, claudication.     Past Medical History  Diagnosis Date  . Diabetes mellitus   . Hypertension   . Cancer of kidney -status post nephrectomy     Kidney cancer 1999  . Hyperlipidemia   . Sleep apnea   . SVT (supraventricular tachycardia)     Documentation pending but occurring during the stress test with Dr. 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 GERD (gastroesophageal reflux disease)   . Family history of long QT syndrome     Past Surgical History  Procedure Laterality Date  . Uvulopalatopharyngoplasty      08/1988  . Tonsillectomy      08/1988  . Cholecystectomy      2000  . Nephrectomy      L---1999 Cancer  . Nasal sinus surgery      2005  . Elbow surgery      left  . Inner ear surgery      R ear  . Ablation  08/27/13    AVNRT ablatin by Dr Lovena Le    Family History  Problem Relation Age of Onset  . Diabetes Father   . Hypertension Father   . Hyperlipidemia Father   .  Colon polyps Father   . Hypertension Mother   . Diabetes Mother   . Colon cancer Neg Hx   . Stomach cancer Neg Hx    Social History:  reports that he quit smoking about 15 years ago. His smoking use included Cigarettes. He has a 50 pack-year smoking history. He has never used smokeless tobacco. He reports that he drinks alcohol. He reports that he does not use illicit drugs.  Allergies:  Allergies  Allergen Reactions  . Sulfonamide Derivatives Hives  . Vicodin [Hydrocodone-Acetaminophen] Rash     (Not in a hospital admission)  Results for orders placed during the hospital encounter of 12/18/13 (from the past 48 hour(s))  CBC     Status: None   Collection Time    12/18/13 12:41 PM      Result Value Ref Range   WBC 9.5  4.0 - 10.5 K/uL   RBC 5.18  4.22 - 5.81 MIL/uL   Hemoglobin 15.9  13.0 - 17.0 g/dL   HCT 45.8  39.0 - 52.0 %   MCV 88.4  78.0 - 100.0 fL   MCH 30.7  26.0 - 34.0 pg   MCHC 34.7  30.0 -  36.0 g/dL   RDW 13.6  11.5 - 15.5 %   Platelets 175  150 - 400 K/uL  BASIC METABOLIC PANEL     Status: Abnormal   Collection Time    12/18/13 12:41 PM      Result Value Ref Range   Sodium 141  137 - 147 mEq/L   Potassium 3.8  3.7 - 5.3 mEq/L   Chloride 98  96 - 112 mEq/L   CO2 28  19 - 32 mEq/L   Glucose, Bld 100 (*) 70 - 99 mg/dL   BUN 14  6 - 23 mg/dL   Creatinine, Ser 0.84  0.50 - 1.35 mg/dL   Calcium 10.3  8.4 - 10.5 mg/dL   GFR calc non Af Amer >90  >90 mL/min   GFR calc Af Amer >90  >90 mL/min   Comment: (NOTE)     The eGFR has been calculated using the CKD EPI equation.     This calculation has not been validated in all clinical situations.     eGFR's persistently <90 mL/min signify possible Chronic Kidney     Disease.  PRO B NATRIURETIC PEPTIDE     Status: None   Collection Time    12/18/13 12:41 PM      Result Value Ref Range   Pro B Natriuretic peptide (BNP) 99.3  0 - 125 pg/mL  I-STAT TROPOININ, ED     Status: None   Collection Time    12/18/13 12:56 PM        Result Value Ref Range   Troponin i, poc 0.02  0.00 - 0.08 ng/mL   Comment 3            Comment: Due to the release kinetics of cTnI,     a negative result within the first hours     of the onset of symptoms does not rule out     myocardial infarction with certainty.     If myocardial infarction is still suspected,     repeat the test at appropriate intervals.   Dg Chest 2 View  12/18/2013   CLINICAL DATA:  Chest pain with history of hypertension  EXAM: CHEST  2 VIEW  COMPARISON:  PA and lateral chest x-ray of July 19, 2011  FINDINGS: The lungs are adequately inflated. There is no focal infiltrate nor parenchymal mass. The heart and mediastinal structures are normal. The bony structures exhibit mild degenerative disc change at multiple thoracic levels.  IMPRESSION: There is no acute cardiopulmonary abnormality.   Electronically Signed   By: David  Martinique   On: 12/18/2013 13:40    Review of Systems  Constitutional: Positive for malaise/fatigue. Negative for fever and diaphoresis.  HENT: Negative for congestion and sore throat.   Respiratory: Positive for shortness of breath. Negative for cough and wheezing.   Cardiovascular: Positive for chest pain and PND. Negative for orthopnea and leg swelling.  Gastrointestinal: Positive for abdominal pain ("tightness"). Negative for nausea, vomiting, blood in stool and melena.  Genitourinary: Negative for hematuria.  Musculoskeletal: Positive for back pain (Between shoulder blades).  Neurological: Negative for dizziness.  All other systems reviewed and are negative.   Blood pressure 154/93, pulse 66, temperature 98.4 F (36.9 C), temperature source Oral, resp. rate 14, height 5' 10"  (1.778 m), weight 248 lb 8 oz (112.719 kg), SpO2 96.00%. Physical Exam  Nursing note and vitals reviewed. Constitutional: He is oriented to person, place, and time. He appears well-developed. No distress.  Overweight.  HENT:  Head: Normocephalic and  atraumatic.  Mouth/Throat: Oropharynx is clear and moist. No oropharyngeal exudate.  Eyes: EOM are normal. Pupils are equal, round, and reactive to light. No scleral icterus.  Neck: Normal range of motion. Neck supple. No JVD present.  Cardiovascular: Normal rate, regular rhythm, S1 normal and S2 normal.   Murmur heard.  Systolic murmur is present with a grade of 1/6  Pulses:      Radial pulses are 2+ on the right side, and 2+ on the left side.       Dorsalis pedis pulses are 2+ on the right side, and 2+ on the left side.  MM at LSB and Apex  Respiratory: Effort normal and breath sounds normal. No respiratory distress. He has no wheezes. He has no rales.  GI: Soft. Bowel sounds are normal. He exhibits distension. There is tenderness (LLQ).  Musculoskeletal: He exhibits no edema.  Neurological: He is alert and oriented to person, place, and time. He exhibits normal muscle tone.  Skin: Skin is warm and dry.  Psychiatric: He has a normal mood and affect.     Assessment/Plan Principal Problem:   Chest pain Symptoms as described are concerning for angina.  EKG does not show any acute changes.  Troponin negative.  CBC,BMET unremarkable.  BNP WNL.  CXR clear.  We will admit to stepdown and cycle troponin.  Cath tomorrow.  Echocardiogram.  He still reports CP.  Will add IV NTG and heparin.   Active Problems:   Fatigue Recent weight gain and fatigue could be thyroid related.  Will check a TSH.  He does not appear in acute CHF.   DIABETES MELLITUS, TYPE II  Hold metformin.   SS insulin.    HYPERLIPIDEMIA  Continue fenofibrate/statin and recheck lipids in AM.    OBSTRUCTIVE SLEEP APNEA  CPAP   HYPERTENSION  Continue lisinopril, HCTZ, Coreg.  Will adjust as need to control BP.     CAD (coronary artery disease)  cath in 2007 which showed nonobstructive CAD-25% mid LAD, 50% distal LAD and 25% prox RCA.   Obesity (BMI 30-39.9)   HAGER, BRYAN, PA-C 12/18/2013, 2:09 PM   Patient seen and  examined. Agree with assessment and plan. Very pleasant  58 yo WM with mild-mod CAD noted in 2007, longstanding h/o OSA on a very old CPAP unit, h/o AVN ablation with DM, s/p nephrectomy in 1999 for renal ca,mixed hyperlipidemia with TG > 600 in past, prior 50 pack year history of tobacco and recent labile BP.  He has noted significant fatigue and exertional sob with chest tightness. No acute ECG changes. I had a long discussion with patient and wife. With progressive symptoms and risk factors I have recommended definitive cardiac catheterization with limited contrast. Will hold lisinopril and metformin for study. Also had long discussion concerning his CPAP. He has an old nondownloadable unit. He would qualify for a new unit but would need re-evaluation which can be arranged as outpatient to assess pressure requirement.   Troy Sine, MD, [facility] 12/18/2013 3:26 PM

## 2013-12-19 ENCOUNTER — Encounter (HOSPITAL_COMMUNITY)
Admission: EM | Disposition: A | Discharge status: Home / Self Care | Payer: Self-pay | Source: Home / Self Care | Attending: Internal Medicine

## 2013-12-19 DIAGNOSIS — F411 Generalized anxiety disorder: Secondary | ICD-10-CM

## 2013-12-19 DIAGNOSIS — IMO0001 Reserved for inherently not codable concepts without codable children: Secondary | ICD-10-CM

## 2013-12-19 DIAGNOSIS — I517 Cardiomegaly: Secondary | ICD-10-CM

## 2013-12-19 DIAGNOSIS — I2510 Atherosclerotic heart disease of native coronary artery without angina pectoris: Principal | ICD-10-CM

## 2013-12-19 HISTORY — PX: LEFT HEART CATHETERIZATION WITH CORONARY ANGIOGRAM: SHX5451

## 2013-12-19 LAB — CBC
HCT: 45.3 % (ref 39.0–52.0)
HCT: 44.5 % (ref 39.0–52.0)
HEMOGLOBIN: 15.4 g/dL (ref 13.0–17.0)
Hemoglobin: 15.5 g/dL (ref 13.0–17.0)
MCH: 30.4 pg (ref 26.0–34.0)
MCH: 31 pg (ref 26.0–34.0)
MCHC: 34.2 g/dL (ref 30.0–36.0)
MCHC: 34.6 g/dL (ref 30.0–36.0)
MCV: 88.8 fL (ref 78.0–100.0)
MCV: 89.7 fL (ref 78.0–100.0)
Platelets: 158 10*3/uL (ref 150–400)
Platelets: 148 10*3/uL — ABNORMAL LOW (ref 150–400)
RBC: 5.1 MIL/uL (ref 4.22–5.81)
RBC: 4.96 MIL/uL (ref 4.22–5.81)
RDW: 13.8 % (ref 11.5–15.5)
RDW: 14.1 % (ref 11.5–15.5)
WBC: 10.7 10*3/uL — ABNORMAL HIGH (ref 4.0–10.5)
WBC: 9.7 10*3/uL (ref 4.0–10.5)

## 2013-12-19 LAB — GLUCOSE, CAPILLARY
GLUCOSE-CAPILLARY: 107 mg/dL — AB (ref 70–99)
GLUCOSE-CAPILLARY: 85 mg/dL (ref 70–99)
Glucose-Capillary: 121 mg/dL — ABNORMAL HIGH (ref 70–99)
Glucose-Capillary: 145 mg/dL — ABNORMAL HIGH (ref 70–99)

## 2013-12-19 LAB — BASIC METABOLIC PANEL
BUN: 13 mg/dL (ref 6–23)
CALCIUM: 9.5 mg/dL (ref 8.4–10.5)
CO2: 30 meq/L (ref 19–32)
CREATININE: 0.97 mg/dL (ref 0.50–1.35)
Chloride: 94 mEq/L — ABNORMAL LOW (ref 96–112)
GFR calc Af Amer: >90 mL/min (ref >90)
GFR calc non Af Amer: >90 mL/min (ref >90)
GLUCOSE: 122 mg/dL — AB (ref 70–99)
Potassium: 3.7 mEq/L (ref 3.7–5.3)
Sodium: 138 mEq/L (ref 137–147)

## 2013-12-19 LAB — LIPID PANEL
CHOL/HDL RATIO: 6.4 ratio
Cholesterol: 148 mg/dL (ref 0–200)
HDL: 23 mg/dL — AB (ref >39)
LDL CALC: UNDETERMINED mg/dL (ref 0–99)
Triglycerides: 434 mg/dL — ABNORMAL HIGH (ref <150)
VLDL: UNDETERMINED mg/dL (ref 0–40)

## 2013-12-19 LAB — CREATININE, SERUM
CREATININE: 0.89 mg/dL (ref 0.50–1.35)
GFR calc Af Amer: >90 mL/min (ref >90)

## 2013-12-19 LAB — HEMOGLOBIN A1C
Hgb A1c MFr Bld: 6.5 % — ABNORMAL HIGH (ref <5.7)
MEAN PLASMA GLUCOSE: 140 mg/dL — AB (ref <117)

## 2013-12-19 LAB — HEPARIN LEVEL (UNFRACTIONATED): Heparin Unfractionated: <0.1 IU/mL — ABNORMAL LOW (ref 0.30–0.70)

## 2013-12-19 LAB — PROTIME-INR
INR: 1.02 (ref 0.00–1.49)
Prothrombin Time: 13.2 seconds (ref 11.6–15.2)

## 2013-12-19 LAB — TROPONIN I: Troponin I: <0.3 ng/mL (ref <0.30)

## 2013-12-19 SURGERY — LEFT HEART CATHETERIZATION WITH CORONARY ANGIOGRAM
Anesthesia: LOCAL

## 2013-12-19 MED ORDER — VERAPAMIL HCL 2.5 MG/ML IV SOLN
INTRAVENOUS | Status: AC
  Filled 2013-12-19: qty 2

## 2013-12-19 MED ORDER — HEPARIN SODIUM (PORCINE) 1000 UNIT/ML IJ SOLN
INTRAMUSCULAR | Status: AC
  Filled 2013-12-19: qty 1

## 2013-12-19 MED ORDER — METHYLPHENIDATE HCL 5 MG PO TABS: 10.0000 mg | ORAL_TABLET | Freq: Three times a day (TID) | ORAL | Status: DC

## 2013-12-19 MED ORDER — FENTANYL CITRATE 0.05 MG/ML IJ SOLN
INTRAMUSCULAR | Status: AC
Start: 2013-12-19 — End: 2013-12-19
  Filled 2013-12-19: qty 2

## 2013-12-19 MED ORDER — MIDAZOLAM HCL 2 MG/2ML IJ SOLN
INTRAMUSCULAR | Status: AC
  Filled 2013-12-19: qty 2

## 2013-12-19 MED ORDER — ATORVASTATIN CALCIUM 40 MG PO TABS
40.0000 mg | ORAL_TABLET | Freq: Every day | ORAL | Status: DC
  Administered 2013-12-19: 40 mg via ORAL
  Filled 2013-12-19 (×2): qty 1

## 2013-12-19 MED ORDER — SODIUM CHLORIDE 0.9 % IV SOLN: INTRAVENOUS | Status: AC

## 2013-12-19 MED ORDER — HEPARIN SODIUM (PORCINE) 5000 UNIT/ML IJ SOLN
5000.0000 [IU] | Freq: Three times a day (TID) | INTRAMUSCULAR | Status: DC
  Administered 2013-12-20: 10:00:00 5000 [IU] via SUBCUTANEOUS
  Filled 2013-12-19 (×4): qty 1

## 2013-12-19 MED ORDER — METHYLPHENIDATE HCL 10 MG PO TABS: 10.0000 mg | ORAL_TABLET | Freq: Three times a day (TID) | ORAL | Status: DC

## 2013-12-19 MED ORDER — LIDOCAINE HCL (PF) 1 % IJ SOLN
INTRAMUSCULAR | Status: AC
  Filled 2013-12-19: qty 30

## 2013-12-19 MED ORDER — NITROGLYCERIN 0.2 MG/ML ON CALL CATH LAB
INTRAVENOUS | Status: AC
  Filled 2013-12-19: qty 1

## 2013-12-19 MED ORDER — HEPARIN BOLUS VIA INFUSION
2500.0000 [IU] | Freq: Once | INTRAVENOUS | Status: DC
  Filled 2013-12-19: qty 2500

## 2013-12-19 MED ORDER — HEPARIN (PORCINE) IN NACL 2-0.9 UNIT/ML-% IJ SOLN
INTRAMUSCULAR | Status: AC
  Filled 2013-12-19: qty 1000

## 2013-12-19 MED ORDER — METHYLPHENIDATE HCL 5 MG PO TABS
10.0000 mg | ORAL_TABLET | Freq: Three times a day (TID) | ORAL | Status: DC
  Administered 2013-12-19 – 2013-12-20 (×2): 10 mg via ORAL
  Filled 2013-12-19 (×2): qty 2

## 2013-12-19 NOTE — Progress Notes (Addendum)
[place] for Heparin Indication: chest pain/ACS  Allergies  Allergen Reactions  . Sulfonamide Derivatives Hives  . Vicodin [Hydrocodone-Acetaminophen] Rash    Patient Measurements: Height: 5\' 10"  (177.8 cm) Weight: 244 lb 7.8 oz (110.9 kg) IBW/kg (Calculated) : 73 Heparin Dosing Weight: 86 kg  Vital Signs: Temp: 97.6 F (36.4 C) (06/12 0800) Temp src: Oral (06/12 0800) BP: 105/31 mmHg (06/12 0800) Pulse Rate: 63 (06/12 0800)  Labs:  Recent Labs  12/18/13 1241 12/18/13 2115 12/19/13 0400 12/19/13 0715  HGB 15.9  --  15.5  --   HCT 45.8  --  45.3  --   PLT 175  --  158  --   LABPROT  --   --   --  13.2  INR  --   --   --  1.02  HEPARINUNFRC  --  <0.10*  --  <0.10*  CREATININE 0.84  --  0.97  --   TROPONINI  --  <0.30 <0.30  --     Estimated Creatinine Clearance: 106.1 ml/min (by C-G formula based on Cr of 0.97).   Assessment: 58 yo man with c/o exertional angina and uncontrolled BP over last 2 months. Follow up heparin level still <0.1 undetectable. No IV issues or bleeding issues per nursing. CBC stable.  Goal of Therapy:  Heparin level 0.3-0.7 units/ml Monitor platelets by anticoagulation protocol: Yes   Plan:  Increasing heparin infusion rate to 1850 units/hr Recheck heparin level this evening if cath is postponed  [name] Hearing PharmD., BCPS Clinical Pharmacist Pager [phone] 12/19/2013 8:41 AM

## 2013-12-19 NOTE — Progress Notes (Signed)
Placed patient on CPAP for the night. Cpap set at 11cm with SpO2 at 97%

## 2013-12-19 NOTE — Progress Notes (Signed)
12/19/13 0045  BiPAP/CPAP/SIPAP  BiPAP/CPAP/SIPAP Pt Type Adult  Mask Type Nasal mask  Mask Size Medium  Respiratory Rate 16 breaths/min  IPAP 11 cmH20  EPAP 11 cmH2O  Flow Rate 3 lpm  BiPAP/CPAP/SIPAP CPAP  Patient Home Equipment No  Auto Titrate No  BiPAP/CPAP /SiPAP Vitals  Resp 16  SpO2 96 %  Patient placed on CPAP per home settings with 3L O2 bleed in. He wears a Medium nasal mask and is tolerating very well at this time.

## 2013-12-19 NOTE — CV Procedure (Signed)
     Left Heart Catheterization with Coronary Angiography  Report  Jesse Vargas  58 y.o.  male 03-03-1956  Procedure Date: 12/19/2013 Referring Physician: Ellouise Newer, M.D. Primary Cardiologist: Ellouise Newer, M.D.  INDICATIONS: Exertional fatigue, continuous chest pain, prior history of noncritical coronary disease and dyspnea on exertion. The studies being done to rule out progression of coronary disease accounting for the patient's chest discomfort at rest.  PROCEDURE: 1. Left heart catheterization; 2. Coronary angiography; 3. Left ventriculography  CONSENT:  The risks, benefits, and details of the procedure were explained in detail to the patient. Risks including death, stroke, heart attack, kidney injury, allergy, limb ischemia, bleeding and radiation injury were discussed.  The patient verbalized understanding and wanted to proceed.  Informed written consent was obtained.  PROCEDURE TECHNIQUE:  After Xylocaine anesthesia a 5 French Slender sheath was placed in the right radial artery with an angiocath and the modified Seldinger technique.  Coronary angiography was done using a 5 F JR 4 and JL 3.5 catheter.  Left ventriculography was done using the JR 4 catheter and hand injection.   Digital images were reviewed and no significant obstructions were felt to be present. Particular attention was paid to the proximal to mid LAD.   CONTRAST:  Total of 120 cc.  COMPLICATIONS:  None   HEMODYNAMICS:  Aortic pressure 123/80 mmHg; LV pressure 127/17 mmHg; LVEDP 20 mm mercury   ANGIOGRAPHIC DATA:   The left main coronary artery is widely patent.  The left anterior descending artery is dominant and reaches the left ventricular apex with a moderate distribution in the inferoapical region. The entire mid LAD between the first and second diagonal branches it is diffusely diseased with up to 50-60 % stenosis in multiple spots. No high-grade obstruction is seen. This region contains mild coronary  calcification.  The left circumflex artery is widely patent with no significant obstruction. A large first obtuse marginal is followed by a smaller second obtuse marginal and a trivial third marginal branch is noted distally.  The right coronary artery is contains proximal and mid irregularities but no high-grade obstruction. The right coronary is dominant.Marland Kitchen   LEFT VENTRICULOGRAM:  Left ventricular angiogram was done in the 30 RAO projection and revealed normal sized LV cavity with normal contractility and an estimated ejection fraction of 55%.   IMPRESSIONS:  1. Moderate mid LAD disease with a diffusely diseased segment containing 40-60% narrowing. No high-grade obstruction is noted 2. Widely patent circumflex and right coronary 3. Normal left ventricular systolic function with elevated end-diastolic pressure consistent with diastolic dysfunction.   RECOMMENDATION:  Aggressive risk factor modification (blood pressure, lipids, blood sugars), weight loss, management of sleep apnea, and continuous low-dose aspirin therapy. The patient is eligible for discharge this evening or in a.m.Marland Kitchen

## 2013-12-19 NOTE — H&P (View-Only) (Signed)
DAILY PROGRESS NOTE  Subjective:  No chest pain today - still c/o of back and neck pain. Headaches and fatigue recently. Troponin negative x 3 overnight. BP is better controlled. Slept well with CPAP overnight.  Objective:  Temp:  [97.6 F (36.4 C)-98.7 F (37.1 C)] 97.6 F (36.4 C) (06/12 0800) Pulse Rate:  [63-72] 63 (06/12 0800) Resp:  [9-28] 9 (06/12 0800) BP: (105-166)/(31-107) 105/31 mmHg (06/12 0800) SpO2:  [93 %-98 %] 98 % (06/12 0800) Weight:  [244 lb 7.8 oz (110.9 kg)-248 lb 8 oz (112.719 kg)] 244 lb 7.8 oz (110.9 kg) (06/11 1940) Weight change:   Intake/Output from previous day: 06/11 0701 - 06/12 0700 In: 12.5 [I.V.:12.5] Out: 350 [Urine:350]  Intake/Output from this shift:    Medications: Current Facility-Administered Medications  Medication Dose Route Frequency Provider Last Rate Last Dose  . 0.9 %  sodium chloride infusion  250 mL Intravenous PRN Tarri Fuller, PA-C      . albuterol (PROVENTIL) (2.5 MG/3ML) 0.083% nebulizer solution 3 mL  3 mL Inhalation QID PRN Tarri Fuller, PA-C      . aspirin EC tablet 81 mg  81 mg Oral Daily Tarri Fuller, PA-C   81 mg at 12/19/13 0912  . carvedilol (COREG) tablet 12.5 mg  12.5 mg Oral BID Tarri Fuller, PA-C   12.5 mg at 12/18/13 2218  . citalopram (CELEXA) tablet 40 mg  40 mg Oral Daily Tarri Fuller, PA-C   40 mg at 12/19/13 0912  . fenofibrate tablet 160 mg  160 mg Oral Daily Tarri Fuller, PA-C   160 mg at 12/19/13 0912  . gabapentin (NEURONTIN) capsule 300 mg  300 mg Oral TID Tarri Fuller, PA-C   300 mg at 12/19/13 0912  . heparin ADULT infusion 100 units/mL (25000 units/250 mL)  1,850 Units/hr Intravenous Continuous Georgina Peer, RPH 18.5 mL/hr at 12/19/13 0843 1,850 Units/hr at 12/19/13 0843  . insulin aspart (novoLOG) injection 0-15 Units  0-15 Units Subcutaneous TID WC Tarri Fuller, PA-C   2 Units at 12/19/13 0934  . methylphenidate (RITALIN) tablet 10 mg  10 mg Oral TID WC Fay Records, MD      . nitroGLYCERIN  (NITROSTAT) SL tablet 0.4 mg  0.4 mg Sublingual Q5 Min x 3 PRN Tarri Fuller, PA-C      . nitroGLYCERIN 0.2 mg/mL in dextrose 5 % infusion  2-200 mcg/min Intravenous Titrated Tarri Fuller, PA-C 1.5 mL/hr at 12/19/13 0800 5 mcg/min at 12/19/13 0800  . oxyCODONE-acetaminophen (PERCOCET/ROXICET) 5-325 MG per tablet 1 tablet  1 tablet Oral Q6H PRN Jules Husbands, MD   1 tablet at 12/19/13 0416   And  . oxyCODONE (Oxy IR/ROXICODONE) immediate release tablet 5 mg  5 mg Oral Q6H PRN Jules Husbands, MD   5 mg at 12/19/13 0416  . pantoprazole (PROTONIX) EC tablet 20 mg  20 mg Oral BID Tarri Fuller, PA-C   20 mg at 12/19/13 0912  . simvastatin (ZOCOR) tablet 10 mg  10 mg Oral q1800 Tarri Fuller, PA-C   10 mg at 12/18/13 2218  . sodium chloride 0.9 % injection 3 mL  3 mL Intravenous Q12H Tarri Fuller, PA-C      . sodium chloride 0.9 % injection 3 mL  3 mL Intravenous PRN Tarri Fuller, PA-C      . traMADol Veatrice Bourbon) tablet 50 mg  50 mg Oral Q12H PRN Tarri Fuller, PA-C   50 mg at 12/19/13 0042    Physical Exam: General appearance: alert and  no distress Neck: no carotid bruit and no JVD Lungs: clear to auscultation bilaterally Heart: regular rate and rhythm, S1, S2 normal, no murmur, click, rub or gallop Abdomen: soft, non-tender; bowel sounds normal; no masses,  no organomegaly Extremities: extremities normal, atraumatic, no cyanosis or edema Pulses: 2+ and symmetric Skin: Skin color, texture, turgor normal. No rashes or lesions Neurologic: Grossly normal Psych: Mood, affect normal  Lab Results: Results for orders placed during the hospital encounter of 12/18/13 (from the past 48 hour(s))  CBC     Status: None   Collection Time    12/18/13 12:41 PM      Result Value Ref Range   WBC 9.5  4.0 - 10.5 K/uL   RBC 5.18  4.22 - 5.81 MIL/uL   Hemoglobin 15.9  13.0 - 17.0 g/dL   HCT 45.8  39.0 - 52.0 %   MCV 88.4  78.0 - 100.0 fL   MCH 30.7  26.0 - 34.0 pg   MCHC 34.7  30.0 - 36.0 g/dL   RDW 13.6  11.5 - 15.5 %    Platelets 175  150 - 400 K/uL  BASIC METABOLIC PANEL     Status: Abnormal   Collection Time    12/18/13 12:41 PM      Result Value Ref Range   Sodium 141  137 - 147 mEq/L   Potassium 3.8  3.7 - 5.3 mEq/L   Chloride 98  96 - 112 mEq/L   CO2 28  19 - 32 mEq/L   Glucose, Bld 100 (*) 70 - 99 mg/dL   BUN 14  6 - 23 mg/dL   Creatinine, Ser 0.84  0.50 - 1.35 mg/dL   Calcium 10.3  8.4 - 10.5 mg/dL   GFR calc non Af Amer >90  >90 mL/min   GFR calc Af Amer >90  >90 mL/min   Comment: (NOTE)     The eGFR has been calculated using the CKD EPI equation.     This calculation has not been validated in all clinical situations.     eGFR's persistently <90 mL/min signify possible Chronic Kidney     Disease.  PRO B NATRIURETIC PEPTIDE     Status: None   Collection Time    12/18/13 12:41 PM      Result Value Ref Range   Pro B Natriuretic peptide (BNP) 99.3  0 - 125 pg/mL  I-STAT TROPOININ, ED     Status: None   Collection Time    12/18/13 12:56 PM      Result Value Ref Range   Troponin i, poc 0.02  0.00 - 0.08 ng/mL   Comment 3            Comment: Due to the release kinetics of cTnI,     a negative result within the first hours     of the onset of symptoms does not rule out     myocardial infarction with certainty.     If myocardial infarction is still suspected,     repeat the test at appropriate intervals.  Randolm Idol, ED     Status: None   Collection Time    12/18/13  3:11 PM      Result Value Ref Range   Troponin i, poc 0.01  0.00 - 0.08 ng/mL   Comment 3            Comment: Due to the release kinetics of cTnI,     a negative result within the first  hours     of the onset of symptoms does not rule out     myocardial infarction with certainty.     If myocardial infarction is still suspected,     repeat the test at appropriate intervals.  MRSA PCR SCREENING     Status: None   Collection Time    12/18/13  6:47 PM      Result Value Ref Range   MRSA by PCR NEGATIVE  NEGATIVE    Comment:            The GeneXpert MRSA Assay (FDA     approved for NASAL specimens     only), is one component of a     comprehensive MRSA colonization     surveillance program. It is not     intended to diagnose MRSA     infection nor to guide or     monitor treatment for     MRSA infections.  HEPARIN LEVEL (UNFRACTIONATED)     Status: Abnormal   Collection Time    12/18/13  9:15 PM      Result Value Ref Range   Heparin Unfractionated <0.10 (*) 0.30 - 0.70 IU/mL   Comment:            IF HEPARIN RESULTS ARE BELOW     EXPECTED VALUES, AND PATIENT     DOSAGE HAS BEEN CONFIRMED,     SUGGEST FOLLOW UP TESTING     OF ANTITHROMBIN III LEVELS.  TROPONIN I     Status: None   Collection Time    12/18/13  9:15 PM      Result Value Ref Range   Troponin I <0.30  <0.30 ng/mL   Comment:            Due to the release kinetics of cTnI,     a negative result within the first hours     of the onset of symptoms does not rule out     myocardial infarction with certainty.     If myocardial infarction is still suspected,     repeat the test at appropriate intervals.  TSH     Status: None   Collection Time    12/18/13  9:15 PM      Result Value Ref Range   TSH 2.240  0.350 - 4.500 uIU/mL  HEMOGLOBIN A1C     Status: Abnormal   Collection Time    12/18/13  9:15 PM      Result Value Ref Range   Hemoglobin A1C 6.5 (*) <5.7 %   Comment: (NOTE)                                                                               According to the ADA Clinical Practice Recommendations for 2011, when     HbA1c is used as a screening test:      >=6.5%   Diagnostic of Diabetes Mellitus               (if abnormal result is confirmed)     5.7-6.4%   Increased risk of developing Diabetes Mellitus     References:Diagnosis and Classification of Diabetes Mellitus,Diabetes  [id number],[id number](GYKZL 1):S62-S69 and Standards of Medical Care in             Diabetes - 2011,Diabetes [id number] (Suppl 1):S11-S61.    Mean Plasma Glucose 140 (*) <117 mg/dL   Comment: Performed at [facility]  CBC     Status: Abnormal   Collection Time    12/19/13  4:00 AM      Result Value Ref Range   WBC 10.7 (*) 4.0 - 10.5 K/uL   RBC 5.10  4.22 - 5.81 MIL/uL   Hemoglobin 15.5  13.0 - 17.0 g/dL   HCT 45.3  39.0 - 52.0 %   MCV 88.8  78.0 - 100.0 fL   MCH 30.4  26.0 - 34.0 pg   MCHC 34.2  30.0 - 36.0 g/dL   RDW 13.8  11.5 - 15.5 %   Platelets 158  150 - 400 K/uL  TROPONIN I     Status: None   Collection Time    12/19/13  4:00 AM      Result Value Ref Range   Troponin I <0.30  <0.30 ng/mL   Comment:            Due to the release kinetics of cTnI,     a negative result within the first hours     of the onset of symptoms does not rule out     myocardial infarction with certainty.     If myocardial infarction is still suspected,     repeat the test at appropriate intervals.  LIPID PANEL     Status: Abnormal   Collection Time    12/19/13  4:00 AM      Result Value Ref Range   Cholesterol 148  0 - 200 mg/dL   Triglycerides 434 (*) <150 mg/dL   HDL 23 (*) >39 mg/dL   Total CHOL/HDL Ratio 6.4     VLDL UNABLE TO CALCULATE IF TRIGLYCERIDE OVER 400 mg/dL  0 - 40 mg/dL   LDL Cholesterol UNABLE TO CALCULATE IF TRIGLYCERIDE OVER 400 mg/dL  0 - 99 mg/dL   Comment:            Total Cholesterol/HDL:CHD Risk     Coronary Heart Disease Risk Table                         Men   Women      1/2 Average Risk   3.4   3.3      Average Risk       5.0   4.4      2 X Average Risk   9.6   7.1      3 X Average Risk  23.4   11.0                Use the calculated Patient Ratio     above and the CHD Risk Table     to determine the patient's CHD Risk.                ATP III CLASSIFICATION (LDL):      <100     mg/dL   Optimal      100-129  mg/dL   Near or Above                        Optimal      130-159  mg/dL   Borderline      160-189  mg/dL  High      >190     mg/dL   Very High  BASIC METABOLIC PANEL     Status:  Abnormal   Collection Time    12/19/13  4:00 AM      Result Value Ref Range   Sodium 138  137 - 147 mEq/L   Potassium 3.7  3.7 - 5.3 mEq/L   Chloride 94 (*) 96 - 112 mEq/L   CO2 30  19 - 32 mEq/L   Glucose, Bld 122 (*) 70 - 99 mg/dL   BUN 13  6 - 23 mg/dL   Creatinine, Ser 0.97  0.50 - 1.35 mg/dL   Calcium 9.5  8.4 - 10.5 mg/dL   GFR calc non Af Amer >90  >90 mL/min   GFR calc Af Amer >90  >90 mL/min   Comment: (NOTE)     The eGFR has been calculated using the CKD EPI equation.     This calculation has not been validated in all clinical situations.     eGFR's persistently <90 mL/min signify possible Chronic Kidney     Disease.  PROTIME-INR     Status: None   Collection Time    12/19/13  7:15 AM      Result Value Ref Range   Prothrombin Time 13.2  11.6 - 15.2 seconds   INR 1.02  0.00 - 1.49  HEPARIN LEVEL (UNFRACTIONATED)     Status: Abnormal   Collection Time    12/19/13  7:15 AM      Result Value Ref Range   Heparin Unfractionated <0.10 (*) 0.30 - 0.70 IU/mL   Comment:            IF HEPARIN RESULTS ARE BELOW     EXPECTED VALUES, AND PATIENT     DOSAGE HAS BEEN CONFIRMED,     SUGGEST FOLLOW UP TESTING     OF ANTITHROMBIN III LEVELS.  GLUCOSE, CAPILLARY     Status: Abnormal   Collection Time    12/19/13  9:21 AM      Result Value Ref Range   Glucose-Capillary 121 (*) 70 - 99 mg/dL    Imaging: Dg Chest 2 View  12/18/2013   CLINICAL DATA:  Chest pain with history of hypertension  EXAM: CHEST  2 VIEW  COMPARISON:  PA and lateral chest x-ray of July 19, 2011  FINDINGS: The lungs are adequately inflated. There is no focal infiltrate nor parenchymal mass. The heart and mediastinal structures are normal. The bony structures exhibit mild degenerative disc change at multiple thoracic levels.  IMPRESSION: There is no acute cardiopulmonary abnormality.   Electronically Signed   By: David  Martinique   On: 12/18/2013 13:40    Assessment:  1. Principal Problem: 2.   Chest pain with  moderate risk of acute coronary syndrome 3. Active Problems: 4.   DIABETES MELLITUS, TYPE II 5.   HYPERLIPIDEMIA 6.   OBSTRUCTIVE SLEEP APNEA 7.   HYPERTENSION 8.   CAD (coronary artery disease) 9.   Obesity (BMI 30-39.9) 10.   Paroxysmal supraventricular tachycardia 11.   Fatigue 12.   Chest pain 13.   Plan:  1. Plan for LHC today. On heparin and nitroglycerin. No further chest pain. BP improved. Triglycerides remain high - he is on fenofibrate and simvastatin 10 mg daily. Will switch to atorvastatin and increase dose to 40 mg daily.  Time Spent Directly with Patient:  15 minutes  Length of Stay:  LOS: 1 day   Nadean Corwin.  Debara Pickett, MD, [facility] Attending Cardiologist [place] 12/19/2013, 9:53 AM

## 2013-12-19 NOTE — Progress Notes (Signed)
  Echocardiogram 2D Echocardiogram has been performed.  Diamond Nickel 12/19/2013, 9:56 AM

## 2013-12-19 NOTE — Progress Notes (Signed)
UR completed. Patient changed to inpatient- requiring IV heparin gtt  

## 2013-12-19 NOTE — Interval H&P Note (Signed)
Cath Lab Visit (complete for each Cath Lab visit)  Clinical Evaluation Leading to the Procedure:   ACS: yes  Non-ACS:    Anginal Classification: CCS IV  Anti-ischemic medical therapy: Minimal Therapy (1 class of medications)  Non-Invasive Test Results: No non-invasive testing performed  Prior CABG: No previous CABG      History and Physical Interval Note:  12/19/2013 4:04 PM  Jesse Vargas  has presented today for surgery, with the diagnosis of cp  The various methods of treatment have been discussed with the patient and family. After consideration of risks, benefits and other options for treatment, the patient has consented to  Procedure(s): LEFT HEART CATHETERIZATION WITH CORONARY ANGIOGRAM (N/A) as a surgical intervention .  The patient's history has been reviewed, patient examined, no change in status, stable for surgery.  I have reviewed the patient's chart and labs.  Questions were answered to the patient's satisfaction.     Sinclair Grooms

## 2013-12-19 NOTE — Progress Notes (Signed)
TR BAND REMOVAL  LOCATION:    right radial  DEFLATED PER PROTOCOL:    yes  TIME BAND OFF / DRESSING APPLIED:    19:30   SITE UPON ARRIVAL:    Level 0  SITE AFTER BAND REMOVAL:    Level 0  REVERSE ALLEN'S TEST:     positive  CIRCULATION SENSATION AND MOVEMENT:    Within Normal Limits   yes  COMMENTS:

## 2013-12-19 NOTE — Progress Notes (Signed)
DAILY PROGRESS NOTE  Subjective:  No chest pain today - still c/o of back and neck pain. Headaches and fatigue recently. Troponin negative x 3 overnight. BP is better controlled. Slept well with CPAP overnight.  Objective:  Temp:  [97.6 F (36.4 C)-98.7 F (37.1 C)] 97.6 F (36.4 C) (06/12 0800) Pulse Rate:  [63-72] 63 (06/12 0800) Resp:  [9-28] 9 (06/12 0800) BP: (105-166)/(31-107) 105/31 mmHg (06/12 0800) SpO2:  [93 %-98 %] 98 % (06/12 0800) Weight:  [244 lb 7.8 oz (110.9 kg)-248 lb 8 oz (112.719 kg)] 244 lb 7.8 oz (110.9 kg) (06/11 1940) Weight change:   Intake/Output from previous day: 06/11 0701 - 06/12 0700 In: 12.5 [I.V.:12.5] Out: 350 [Urine:350]  Intake/Output from this shift:    Medications: Current Facility-Administered Medications  Medication Dose Route Frequency Provider Last Rate Last Dose  . 0.9 %  sodium chloride infusion  250 mL Intravenous PRN Tarri Fuller, PA-C      . albuterol (PROVENTIL) (2.5 MG/3ML) 0.083% nebulizer solution 3 mL  3 mL Inhalation QID PRN Tarri Fuller, PA-C      . aspirin EC tablet 81 mg  81 mg Oral Daily Tarri Fuller, PA-C   81 mg at 12/19/13 0912  . carvedilol (COREG) tablet 12.5 mg  12.5 mg Oral BID Tarri Fuller, PA-C   12.5 mg at 12/18/13 2218  . citalopram (CELEXA) tablet 40 mg  40 mg Oral Daily Tarri Fuller, PA-C   40 mg at 12/19/13 0912  . fenofibrate tablet 160 mg  160 mg Oral Daily Tarri Fuller, PA-C   160 mg at 12/19/13 0912  . gabapentin (NEURONTIN) capsule 300 mg  300 mg Oral TID Tarri Fuller, PA-C   300 mg at 12/19/13 0912  . heparin ADULT infusion 100 units/mL (25000 units/250 mL)  1,850 Units/hr Intravenous Continuous Georgina Peer, RPH 18.5 mL/hr at 12/19/13 0843 1,850 Units/hr at 12/19/13 0843  . insulin aspart (novoLOG) injection 0-15 Units  0-15 Units Subcutaneous TID WC Tarri Fuller, PA-C   2 Units at 12/19/13 0934  . methylphenidate (RITALIN) tablet 10 mg  10 mg Oral TID WC Fay Records, MD      . nitroGLYCERIN  (NITROSTAT) SL tablet 0.4 mg  0.4 mg Sublingual Q5 Min x 3 PRN Tarri Fuller, PA-C      . nitroGLYCERIN 0.2 mg/mL in dextrose 5 % infusion  2-200 mcg/min Intravenous Titrated Tarri Fuller, PA-C 1.5 mL/hr at 12/19/13 0800 5 mcg/min at 12/19/13 0800  . oxyCODONE-acetaminophen (PERCOCET/ROXICET) 5-325 MG per tablet 1 tablet  1 tablet Oral Q6H PRN Jules Husbands, MD   1 tablet at 12/19/13 0416   And  . oxyCODONE (Oxy IR/ROXICODONE) immediate release tablet 5 mg  5 mg Oral Q6H PRN Jules Husbands, MD   5 mg at 12/19/13 0416  . pantoprazole (PROTONIX) EC tablet 20 mg  20 mg Oral BID Tarri Fuller, PA-C   20 mg at 12/19/13 0912  . simvastatin (ZOCOR) tablet 10 mg  10 mg Oral q1800 Tarri Fuller, PA-C   10 mg at 12/18/13 2218  . sodium chloride 0.9 % injection 3 mL  3 mL Intravenous Q12H Tarri Fuller, PA-C      . sodium chloride 0.9 % injection 3 mL  3 mL Intravenous PRN Tarri Fuller, PA-C      . traMADol Veatrice Bourbon) tablet 50 mg  50 mg Oral Q12H PRN Tarri Fuller, PA-C   50 mg at 12/19/13 0042    Physical Exam: General appearance: alert and  no distress Neck: no carotid bruit and no JVD Lungs: clear to auscultation bilaterally Heart: regular rate and rhythm, S1, S2 normal, no murmur, click, rub or gallop Abdomen: soft, non-tender; bowel sounds normal; no masses,  no organomegaly Extremities: extremities normal, atraumatic, no cyanosis or edema Pulses: 2+ and symmetric Skin: Skin color, texture, turgor normal. No rashes or lesions Neurologic: Grossly normal Psych: Mood, affect normal  Lab Results: Results for orders placed during the hospital encounter of 12/18/13 (from the past 48 hour(s))  CBC     Status: None   Collection Time    12/18/13 12:41 PM      Result Value Ref Range   WBC 9.5  4.0 - 10.5 K/uL   RBC 5.18  4.22 - 5.81 MIL/uL   Hemoglobin 15.9  13.0 - 17.0 g/dL   HCT 45.8  39.0 - 52.0 %   MCV 88.4  78.0 - 100.0 fL   MCH 30.7  26.0 - 34.0 pg   MCHC 34.7  30.0 - 36.0 g/dL   RDW 13.6  11.5 - 15.5 %    Platelets 175  150 - 400 K/uL  BASIC METABOLIC PANEL     Status: Abnormal   Collection Time    12/18/13 12:41 PM      Result Value Ref Range   Sodium 141  137 - 147 mEq/L   Potassium 3.8  3.7 - 5.3 mEq/L   Chloride 98  96 - 112 mEq/L   CO2 28  19 - 32 mEq/L   Glucose, Bld 100 (*) 70 - 99 mg/dL   BUN 14  6 - 23 mg/dL   Creatinine, Ser 0.84  0.50 - 1.35 mg/dL   Calcium 10.3  8.4 - 10.5 mg/dL   GFR calc non Af Amer >90  >90 mL/min   GFR calc Af Amer >90  >90 mL/min   Comment: (NOTE)     The eGFR has been calculated using the CKD EPI equation.     This calculation has not been validated in all clinical situations.     eGFR's persistently <90 mL/min signify possible Chronic Kidney     Disease.  PRO B NATRIURETIC PEPTIDE     Status: None   Collection Time    12/18/13 12:41 PM      Result Value Ref Range   Pro B Natriuretic peptide (BNP) 99.3  0 - 125 pg/mL  I-STAT TROPOININ, ED     Status: None   Collection Time    12/18/13 12:56 PM      Result Value Ref Range   Troponin i, poc 0.02  0.00 - 0.08 ng/mL   Comment 3            Comment: Due to the release kinetics of cTnI,     a negative result within the first hours     of the onset of symptoms does not rule out     myocardial infarction with certainty.     If myocardial infarction is still suspected,     repeat the test at appropriate intervals.  Randolm Idol, ED     Status: None   Collection Time    12/18/13  3:11 PM      Result Value Ref Range   Troponin i, poc 0.01  0.00 - 0.08 ng/mL   Comment 3            Comment: Due to the release kinetics of cTnI,     a negative result within the first  hours     of the onset of symptoms does not rule out     myocardial infarction with certainty.     If myocardial infarction is still suspected,     repeat the test at appropriate intervals.  MRSA PCR SCREENING     Status: None   Collection Time    12/18/13  6:47 PM      Result Value Ref Range   MRSA by PCR NEGATIVE  NEGATIVE    Comment:            The GeneXpert MRSA Assay (FDA     approved for NASAL specimens     only), is one component of a     comprehensive MRSA colonization     surveillance program. It is not     intended to diagnose MRSA     infection nor to guide or     monitor treatment for     MRSA infections.  HEPARIN LEVEL (UNFRACTIONATED)     Status: Abnormal   Collection Time    12/18/13  9:15 PM      Result Value Ref Range   Heparin Unfractionated <0.10 (*) 0.30 - 0.70 IU/mL   Comment:            IF HEPARIN RESULTS ARE BELOW     EXPECTED VALUES, AND PATIENT     DOSAGE HAS BEEN CONFIRMED,     SUGGEST FOLLOW UP TESTING     OF ANTITHROMBIN III LEVELS.  TROPONIN I     Status: None   Collection Time    12/18/13  9:15 PM      Result Value Ref Range   Troponin I <0.30  <0.30 ng/mL   Comment:            Due to the release kinetics of cTnI,     a negative result within the first hours     of the onset of symptoms does not rule out     myocardial infarction with certainty.     If myocardial infarction is still suspected,     repeat the test at appropriate intervals.  TSH     Status: None   Collection Time    12/18/13  9:15 PM      Result Value Ref Range   TSH 2.240  0.350 - 4.500 uIU/mL  HEMOGLOBIN A1C     Status: Abnormal   Collection Time    12/18/13  9:15 PM      Result Value Ref Range   Hemoglobin A1C 6.5 (*) <5.7 %   Comment: (NOTE)                                                                               According to the ADA Clinical Practice Recommendations for 2011, when     HbA1c is used as a screening test:      >=6.5%   Diagnostic of Diabetes Mellitus               (if abnormal result is confirmed)     5.7-6.4%   Increased risk of developing Diabetes Mellitus     References:Diagnosis and Classification of Diabetes Mellitus,Diabetes  BSJG,2836,62(HUTML 1):S62-S69 and Standards of Medical Care in             Diabetes - 2011,Diabetes YYTK,3546,56 (Suppl 1):S11-S61.    Mean Plasma Glucose 140 (*) <117 mg/dL   Comment: Performed at [facility]  CBC     Status: Abnormal   Collection Time    12/19/13  4:00 AM      Result Value Ref Range   WBC 10.7 (*) 4.0 - 10.5 K/uL   RBC 5.10  4.22 - 5.81 MIL/uL   Hemoglobin 15.5  13.0 - 17.0 g/dL   HCT 45.3  39.0 - 52.0 %   MCV 88.8  78.0 - 100.0 fL   MCH 30.4  26.0 - 34.0 pg   MCHC 34.2  30.0 - 36.0 g/dL   RDW 13.8  11.5 - 15.5 %   Platelets 158  150 - 400 K/uL  TROPONIN I     Status: None   Collection Time    12/19/13  4:00 AM      Result Value Ref Range   Troponin I <0.30  <0.30 ng/mL   Comment:            Due to the release kinetics of cTnI,     a negative result within the first hours     of the onset of symptoms does not rule out     myocardial infarction with certainty.     If myocardial infarction is still suspected,     repeat the test at appropriate intervals.  LIPID PANEL     Status: Abnormal   Collection Time    12/19/13  4:00 AM      Result Value Ref Range   Cholesterol 148  0 - 200 mg/dL   Triglycerides 434 (*) <150 mg/dL   HDL 23 (*) >39 mg/dL   Total CHOL/HDL Ratio 6.4     VLDL UNABLE TO CALCULATE IF TRIGLYCERIDE OVER 400 mg/dL  0 - 40 mg/dL   LDL Cholesterol UNABLE TO CALCULATE IF TRIGLYCERIDE OVER 400 mg/dL  0 - 99 mg/dL   Comment:            Total Cholesterol/HDL:CHD Risk     Coronary Heart Disease Risk Table                         Men   Women      1/2 Average Risk   3.4   3.3      Average Risk       5.0   4.4      2 X Average Risk   9.6   7.1      3 X Average Risk  23.4   11.0                Use the calculated Patient Ratio     above and the CHD Risk Table     to determine the patient's CHD Risk.                ATP III CLASSIFICATION (LDL):      <100     mg/dL   Optimal      100-129  mg/dL   Near or Above                        Optimal      130-159  mg/dL   Borderline      160-189  mg/dL  High      >190     mg/dL   Very High  BASIC METABOLIC PANEL     Status:  Abnormal   Collection Time    12/19/13  4:00 AM      Result Value Ref Range   Sodium 138  137 - 147 mEq/L   Potassium 3.7  3.7 - 5.3 mEq/L   Chloride 94 (*) 96 - 112 mEq/L   CO2 30  19 - 32 mEq/L   Glucose, Bld 122 (*) 70 - 99 mg/dL   BUN 13  6 - 23 mg/dL   Creatinine, Ser 0.97  0.50 - 1.35 mg/dL   Calcium 9.5  8.4 - 10.5 mg/dL   GFR calc non Af Amer >90  >90 mL/min   GFR calc Af Amer >90  >90 mL/min   Comment: (NOTE)     The eGFR has been calculated using the CKD EPI equation.     This calculation has not been validated in all clinical situations.     eGFR's persistently <90 mL/min signify possible Chronic Kidney     Disease.  PROTIME-INR     Status: None   Collection Time    12/19/13  7:15 AM      Result Value Ref Range   Prothrombin Time 13.2  11.6 - 15.2 seconds   INR 1.02  0.00 - 1.49  HEPARIN LEVEL (UNFRACTIONATED)     Status: Abnormal   Collection Time    12/19/13  7:15 AM      Result Value Ref Range   Heparin Unfractionated <0.10 (*) 0.30 - 0.70 IU/mL   Comment:            IF HEPARIN RESULTS ARE BELOW     EXPECTED VALUES, AND PATIENT     DOSAGE HAS BEEN CONFIRMED,     SUGGEST FOLLOW UP TESTING     OF ANTITHROMBIN III LEVELS.  GLUCOSE, CAPILLARY     Status: Abnormal   Collection Time    12/19/13  9:21 AM      Result Value Ref Range   Glucose-Capillary 121 (*) 70 - 99 mg/dL    Imaging: Dg Chest 2 View  12/18/2013   CLINICAL DATA:  Chest pain with history of hypertension  EXAM: CHEST  2 VIEW  COMPARISON:  PA and lateral chest x-ray of July 19, 2011  FINDINGS: The lungs are adequately inflated. There is no focal infiltrate nor parenchymal mass. The heart and mediastinal structures are normal. The bony structures exhibit mild degenerative disc change at multiple thoracic levels.  IMPRESSION: There is no acute cardiopulmonary abnormality.   Electronically Signed   By: David  Martinique   On: 12/18/2013 13:40    Assessment:  1. Principal Problem: 2.   Chest pain with  moderate risk of acute coronary syndrome 3. Active Problems: 4.   DIABETES MELLITUS, TYPE II 5.   HYPERLIPIDEMIA 6.   OBSTRUCTIVE SLEEP APNEA 7.   HYPERTENSION 8.   CAD (coronary artery disease) 9.   Obesity (BMI 30-39.9) 10.   Paroxysmal supraventricular tachycardia 11.   Fatigue 12.   Chest pain 13.   Plan:  1. Plan for LHC today. On heparin and nitroglycerin. No further chest pain. BP improved. Triglycerides remain high - he is on fenofibrate and simvastatin 10 mg daily. Will switch to atorvastatin and increase dose to 40 mg daily.  Time Spent Directly with Patient:  15 minutes  Length of Stay:  LOS: 1 day   Nadean Corwin.  Debara Pickett, MD, [facility] Attending Cardiologist [place] C 12/19/2013, 9:53 AM

## 2013-12-20 ENCOUNTER — Encounter (HOSPITAL_COMMUNITY): Payer: Self-pay | Admitting: Physician Assistant

## 2013-12-20 DIAGNOSIS — R079 Chest pain, unspecified: Secondary | ICD-10-CM

## 2013-12-20 DIAGNOSIS — I10 Essential (primary) hypertension: Secondary | ICD-10-CM

## 2013-12-20 LAB — CBC
HCT: 45.8 % (ref 39.0–52.0)
Hemoglobin: 15.4 g/dL (ref 13.0–17.0)
MCH: 30.6 pg (ref 26.0–34.0)
MCHC: 33.6 g/dL (ref 30.0–36.0)
MCV: 90.9 fL (ref 78.0–100.0)
PLATELETS: 152 10*3/uL (ref 150–400)
RBC: 5.04 MIL/uL (ref 4.22–5.81)
RDW: 14.1 % (ref 11.5–15.5)
WBC: 9.1 10*3/uL (ref 4.0–10.5)

## 2013-12-20 LAB — GLUCOSE, CAPILLARY: GLUCOSE-CAPILLARY: 116 mg/dL — AB (ref 70–99)

## 2013-12-20 MED ORDER — HYDRALAZINE HCL 25 MG PO TABS
25.0000 mg | ORAL_TABLET | ORAL | Status: AC
  Administered 2013-12-20: 25 mg via ORAL
  Filled 2013-12-20: qty 1

## 2013-12-20 MED ORDER — LISINOPRIL-HYDROCHLOROTHIAZIDE 20-25 MG PO TABS: 1.0000 | ORAL_TABLET | Freq: Two times a day (BID) | ORAL | Status: DC

## 2013-12-20 MED ORDER — LISINOPRIL 20 MG PO TABS
20.0000 mg | ORAL_TABLET | Freq: Every day | ORAL | Status: DC
  Administered 2013-12-20: 20 mg via ORAL
  Filled 2013-12-20: qty 1

## 2013-12-20 MED ORDER — METFORMIN HCL 1000 MG PO TABS: ORAL_TABLET | ORAL | Status: DC

## 2013-12-20 MED ORDER — ATORVASTATIN CALCIUM 40 MG PO TABS: 40.0000 mg | ORAL_TABLET | Freq: Every day | ORAL | Status: DC

## 2013-12-20 MED ORDER — NITROGLYCERIN 0.4 MG/SPRAY TL SOLN: 1.0000 | Status: DC | PRN

## 2013-12-20 NOTE — Progress Notes (Signed)
Cross Cover Note  Paged about persistent hypertension, SBPs between 160s and 190s.   Lisinopril had been held for cath and this is likely a contributor.   Will give 25mg  PO hydral now and restart lisinopril in the AM at home 20mg  dose.

## 2013-12-20 NOTE — Discharge Summary (Signed)
Discharge Summary   Patient ID: Jesse Vargas, MRN: [id number], DOB/AGE: Feb 20, 1956 58 y.o.  Admit date: 12/18/2013 Discharge date: 12/20/2013   Primary Care Physician:  Rey,Raquel   Primary Cardiologist:  Dr. Dorris Carnes  Primary Electrophysiologist:  Dr. Cristopher Peru   Reason for Admission:  Chest Pain   Primary Discharge Diagnoses:  Principal Problem:   Chest pain with moderate risk of acute coronary syndrome Active Problems:   DIABETES MELLITUS, TYPE II   HYPERLIPIDEMIA   OBSTRUCTIVE SLEEP APNEA   HYPERTENSION   CAD (coronary artery disease) - non-obstructive by [facility] 12/19/13 - med Rx   Obesity (BMI 30-39.9)   Paroxysmal supraventricular tachycardia     Wt Readings from Last 3 Encounters:  12/20/13 244 lb 7.8 oz (110.9 kg)  12/20/13 244 lb 7.8 oz (110.9 kg)  11/26/13 248 lb (112.492 kg)    Secondary Discharge Diagnoses:   Past Medical History  Diagnosis Date  . Diabetes mellitus   . Hypertension   . Cancer of kidney -status post nephrectomy     Kidney cancer 1999  . Hyperlipidemia   . Sleep apnea   . SVT (supraventricular tachycardia)     Documentation pending but occurring during the stress test with Dr. 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 GERD (gastroesophageal reflux disease)   . Family history of long QT syndrome   . CAD (coronary artery disease)     a. LHC (12/19/13):  mid LAD 50-60%, EF 55%, patent CFX and RCA - med Rx.  Marland Kitchen Hx of echocardiogram     a.  Echo (12/2013):  mod LVH, EF 55-60%, no RWMA, mild LAE      Allergies:    Allergies  Allergen Reactions  . Sulfonamide Derivatives Hives  . Vicodin [Hydrocodone-Acetaminophen] Rash      Procedures Performed This Admission:    Cardiac Catheterization (12/19/13): The left main coronary artery is widely patent.  The left anterior descending artery is dominant and reaches the left ventricular apex with a moderate distribution in the inferoapical region. The entire mid LAD between the  first and second diagonal branches it is diffusely diseased with up to 50-60 % stenosis in multiple spots. No high-grade obstruction is seen. This region contains mild coronary calcification.  The left circumflex artery is widely patent with no significant obstruction. A large first obtuse marginal is followed by a smaller second obtuse marginal and a trivial third marginal branch is noted distally.  The right coronary artery is contains proximal and mid irregularities but no high-grade obstruction. The right coronary is dominant.Marland Kitchen  LEFT VENTRICULOGRAM: Left ventricular angiogram was done in the 30 RAO projection and revealed normal sized LV cavity with normal contractility and an estimated ejection fraction of 55%.  IMPRESSIONS: 1. Moderate mid LAD disease with a diffusely diseased segment containing 40-60% narrowing. No high-grade obstruction is noted  2. Widely patent circumflex and right coronary  3. Normal left ventricular systolic function with elevated end-diastolic pressure consistent with diastolic dysfunction.     Hospital Course:  Jesse Vargas is a 58 y.o. male with a hx of non-obstructive CAD, HTN, HL, DM, SVT s/p ablation, renal CA s/p nephrectomy.   He presented to [facility] on the day of admission with exertional dyspnea and chest tightness felt to be consistent with angina.  He was admitted and placed on IV NTG and IV Heparin. CEs remained normal.  Medications were adjusted for better BP control.  Simvastatin was changed to Atorvastatin 40 mg.  Echo demonstrated normal LVF with normal wall motion and mod LVH.  LHC was recommended.  This demonstrated mod mid LAD stenosis (40-60%) and patent RCA and CFX.  Med Rx was recommended.  He was seen by Dr. Rozann Lesches this AM.  He is doing well without chest pain.  Lisinopril is adjusted to Lisinopril/HCTZ 20/25 mg bid at d/c for better BP control.  He is felt stable for d/c to home.     Discharge Vitals:   Blood pressure 174/96, pulse  76, temperature 97.2 F (36.2 C), temperature source Oral, resp. rate 18, height 5\' 10"  (1.778 m), weight 244 lb 7.8 oz (110.9 kg), SpO2 96.00%.   Labs:   Recent Labs  12/19/13 0400 12/19/13 1829 12/20/13 0318  WBC 10.7* 9.7 9.1  HGB 15.5 15.4 15.4  HCT 45.3 44.5 45.8  MCV 88.8 89.7 90.9  PLT 158 148* 152     Recent Labs  12/18/13 1241 12/19/13 0400 12/19/13 1829  NA 141 138  --   K 3.8 3.7  --   CL 98 94*  --   CO2 28 30  --   BUN 14 13  --   CREATININE 0.84 0.97 0.89  CALCIUM 10.3 9.5  --      Recent Labs  12/18/13 2115 12/19/13 0400  TROPONINI <0.30 <0.30    Lab Results  Component Value Date   CHOL 148 12/19/2013   HDL 23* 12/19/2013   LDLCALC UNABLE TO CALCULATE IF TRIGLYCERIDE OVER 400 mg/dL 12/19/2013   TRIG 434* 12/19/2013     Lab Results  Component Value Date   TSH 2.240 12/18/2013     Recent Labs  12/19/13 0715  INR 1.02     Diagnostic Procedures and Studies:  Dg Chest 2 View   12/18/2013    IMPRESSION: There is no acute cardiopulmonary abnormality.   Electronically Signed   By: David  Martinique   On: 12/18/2013 13:40     2D Echocardiogram (12/19/13): Study Conclusions  - Left ventricle: The cavity size was normal. Wall thickness was increased in a pattern of moderate LVH. Systolic function was normal. The estimated ejection fraction was in the range of 55% to 60%. Wall motion was normal; there were no regional wall motion abnormalities. - Mitral valve: Calcified annulus. - Left atrium: The atrium was mildly dilated.    Disposition:   Pt is being discharged home today in good condition.  Follow-up Plans & Appointments      Follow-up Information   Follow up with Dorris Carnes, MD In 2 weeks. (office will call for appointment)    Specialty:  Cardiology   Contact information:   [address] [place] [address] [phone]       Discharge Medications    Medication List    STOP taking these medications        pravastatin 20 MG tablet  Commonly known as:  PRAVACHOL      TAKE these medications       albuterol 108 (90 BASE) MCG/ACT inhaler  Commonly known as:  PROAIR HFA  Inhale 2 puffs into the lungs 4 (four) times daily as needed for wheezing.     aspirin EC 81 MG tablet  Take 81 mg by mouth daily.     atorvastatin 40 MG tablet  Commonly known as:  LIPITOR  Take 1 tablet (40 mg total) by mouth daily at 6 PM.     carvedilol 12.5 MG tablet  Commonly known as:  COREG  Take 1 tablet (12.5 mg total) by mouth 2 (two) times daily.     citalopram 40 MG tablet  Commonly known as:  CELEXA  Take 1 tablet by mouth  daily     cyclobenzaprine 10 MG tablet  Commonly known as:  FLEXERIL  Take 1 tablet at bedtime as needed for muscle spasms     fenofibrate 160 MG tablet  Take 1 tablet (160 mg total) by mouth daily.     fluticasone 50 MCG/ACT nasal spray  Commonly known as:  FLONASE  Place 2 sprays into both nostrils daily.     gabapentin 300 MG capsule  Commonly known as:  NEURONTIN  Take 1 capsule (300 mg total) by mouth 3 (three) times daily.     lisinopril-hydrochlorothiazide 20-25 MG per tablet  Commonly known as:  PRINZIDE,ZESTORETIC  Take 1 tablet by mouth 2 (two) times daily.     meloxicam 15 MG tablet  Commonly known as:  MOBIC  Take 1 tablet (15 mg total) by mouth daily.     metFORMIN 1000 MG tablet  Commonly known as:  GLUCOPHAGE  Take 1500 mg with breakfast and 1000 mg with dinner. Do not exceed 2500 mg per day  Start taking on:  12/22/2013     methylphenidate 10 MG tablet  Commonly known as:  RITALIN  Take 1 tablet (10 mg total) by mouth 3 (three) times daily.     nitroGLYCERIN 0.4 MG/SPRAY spray  Commonly known as:  NITROLINGUAL  Place 1 spray under the tongue every 5 (five) minutes x 3 doses as needed for chest pain.     NON FORMULARY  Take 1 mL by mouth once a week. Nugenix-- Testosterone Booster     oxyCODONE-acetaminophen 10-325 MG per tablet    Commonly known as:  PERCOCET  Take 1 tablet by mouth every 4 (four) hours as needed for pain.     pantoprazole 20 MG tablet  Commonly known as:  PROTONIX  Take 1 tablet (20 mg total) by mouth 2 (two) times daily.     tadalafil 5 MG tablet  Commonly known as:  CIALIS  Take 1 tablet (5 mg total) by mouth daily as needed for erectile dysfunction. 1 po qd     traMADol 50 MG tablet  Commonly known as:  ULTRAM  Take 1 tablet (50 mg total) by mouth 3 (three) times daily as needed for pain.         Outstanding Labs/Studies  1. BMET in 1-2 weeks. 2. Lipids and LFTs in 6-8 weeks.  Duration of Discharge Encounter: Greater than 30 minutes including physician and PA time.  Signed, Richardson Dopp, PA-C   12/20/2013 9:35 AM

## 2013-12-20 NOTE — Progress Notes (Signed)
Primary cardiologist: Dr. Dorris Carnes  Subjective:   No chest pain or shortness of breath.   Objective:   Temp:  [97.2 F (36.2 C)-99.1 F (37.3 C)] 97.2 F (36.2 C) (06/13 0412) Pulse Rate:  [59-76] 76 (06/13 0803) Resp:  [10-18] 18 (06/13 0803) BP: (110-195)/(76-111) 174/96 mmHg (06/13 0803) SpO2:  [93 %-98 %] 96 % (06/13 0803) Weight:  [244 lb 7.8 oz (110.9 kg)] 244 lb 7.8 oz (110.9 kg) (06/13 0013) Last BM Date: 12/19/13  Filed Weights   12/18/13 1845 12/18/13 1940 12/20/13 0013  Weight: 244 lb 7.8 oz (110.9 kg) 244 lb 7.8 oz (110.9 kg) 244 lb 7.8 oz (110.9 kg)    Intake/Output Summary (Last 24 hours) at 12/20/13 0806 Last data filed at 12/20/13 0804  Gross per 24 hour  Intake  912.5 ml  Output   1700 ml  Net -787.5 ml    Telemetry: Sinus rhythm.  Exam:  General: Appears comfortable.  Lungs: Clear, nonlabored.  Cardiac: RRR, no gallop.  Extremities: Stable right radial access site.   Lab Results:  Basic Metabolic Panel:  Recent Labs Lab 12/18/13 1241 12/19/13 0400 12/19/13 1829  NA 141 138  --   K 3.8 3.7  --   CL 98 94*  --   CO2 28 30  --   GLUCOSE 100* 122*  --   BUN 14 13  --   CREATININE 0.84 0.97 0.89  CALCIUM 10.3 9.5  --     CBC:  Recent Labs Lab 12/19/13 0400 12/19/13 1829 12/20/13 0318  WBC 10.7* 9.7 9.1  HGB 15.5 15.4 15.4  HCT 45.3 44.5 45.8  MCV 88.8 89.7 90.9  PLT 158 148* 152    Cardiac Enzymes:  Recent Labs Lab 12/18/13 2115 12/19/13 0400  TROPONINI <0.30 <0.30    BNP:  Recent Labs  12/18/13 1241  PROBNP 99.3    Echocardiogram (12/19/13) Study Conclusions  - Left ventricle: The cavity size was normal. Wall thickness was increased in a pattern of moderate LVH. Systolic function was normal. The estimated ejection fraction was in the range of 55% to 60%. Wall motion was normal; there were no regional wall motion abnormalities. - Mitral valve: Calcified annulus. - Left atrium: The atrium was  mildly dilated.    Medications:   Scheduled Medications: . aspirin EC  81 mg Oral Daily  . atorvastatin  40 mg Oral q1800  . carvedilol  12.5 mg Oral BID  . citalopram  40 mg Oral Daily  . fenofibrate  160 mg Oral Daily  . gabapentin  300 mg Oral TID  . heparin  5,000 Units Subcutaneous 3 times per day  . insulin aspart  0-15 Units Subcutaneous TID WC  . lisinopril  20 mg Oral Daily  . methylphenidate  10 mg Oral TID WC  . pantoprazole  20 mg Oral BID      PRN Medications:  albuterol, nitroGLYCERIN, oxyCODONE, oxyCODONE-acetaminophen, traMADol   Assessment:   1. Chest pain syndrome, negative cardiac markers for ACS.  2. CAD, 40-60% diffuse mid LAD stenosis, no obvious culprit lesions for revascularization cardiac catheterization June 12. Plan is for medical therapy.  3. Hypertension, blood pressure has not been optimal. Was off lisinopril around the time of his cardiac catheterization. He also tells me that blood pressure has been elevated at home.  4. Hyperlipidemia, on statin therapy.  5. History of SVT.   Plan/Discussion:    Patient for discharge home today. Plan is to continue medical  therapy. Would change lisinopril to lisinopril HCTZ 20/12.5 mg twice a day. Otherwise no change to current regimen. He needs followup in the next 2 weeks with Dr. Harrington Challenger or APP, would check BMET.   Satira Sark, M.D., F.A.C.C.

## 2013-12-20 NOTE — Discharge Summary (Signed)
Please see rounding note as well.

## 2013-12-20 NOTE — Discharge Instructions (Signed)
DO NOT TAKE METFORMIN UNTIL 12/22/13 (Monday).

## 2013-12-26 ENCOUNTER — Telehealth: Payer: Self-pay | Admitting: *Deleted

## 2013-12-26 NOTE — Telephone Encounter (Signed)
Received blood pressure readings from pt, since discharge from the hosp his bp ranges from 190-135/102-67 with pulse 60-80. He is very concerned. He is currently taking carvedilol 12.5 mg bid and lisinopril/hctz 20-25 mg three times daily. He has no edema, SOB or chest pain. Will forward for dr Harrington Challenger review.

## 2013-12-26 NOTE — Telephone Encounter (Signed)
I would recomm that he increase coreg to 25 bid.

## 2013-12-29 MED ORDER — CARVEDILOL 25 MG PO TABS: 25.0000 mg | ORAL_TABLET | Freq: Two times a day (BID) | ORAL | Status: DC

## 2013-12-29 NOTE — Telephone Encounter (Signed)
Spoke with pt, Aware of dr Harrington Challenger recommendation

## 2014-01-05 ENCOUNTER — Encounter: Payer: Self-pay | Admitting: Adult Health

## 2014-01-19 ENCOUNTER — Ambulatory Visit (INDEPENDENT_AMBULATORY_CARE_PROVIDER_SITE_OTHER): Payer: 59 | Admitting: Internal Medicine

## 2014-01-19 ENCOUNTER — Telehealth: Payer: Self-pay | Admitting: Adult Health

## 2014-01-19 ENCOUNTER — Encounter: Payer: Self-pay | Admitting: Internal Medicine

## 2014-01-19 VITALS — BP 141/74 | HR 62 | Ht 70.0 in | Wt 242.0 lb

## 2014-01-19 DIAGNOSIS — Z79899 Other long term (current) drug therapy: Secondary | ICD-10-CM

## 2014-01-19 MED ORDER — LISINOPRIL-HYDROCHLOROTHIAZIDE 20-25 MG PO TABS: 1.0000 | ORAL_TABLET | Freq: Two times a day (BID) | ORAL | Status: DC

## 2014-01-19 MED ORDER — CARVEDILOL 25 MG PO TABS: 25.0000 mg | ORAL_TABLET | Freq: Two times a day (BID) | ORAL | Status: DC

## 2014-01-19 NOTE — Progress Notes (Signed)
HPI HPI  Patient is a 58 yo with a istory of mild CAD by cath in 2007. Also a history of HTN, DM , HL, sleep apnea. I saw him in clinic in Oct 2012. Myoview done after seen. This was normal. I saw the patient in clinic in Feb 2014  He has been seen by G taylor since.  He denies CP  Breathing is OK    Allergies  Allergen Reactions  . Sulfonamide Derivatives Hives  . Vicodin [Hydrocodone-Acetaminophen] Rash    Current Outpatient Prescriptions  Medication Sig Dispense Refill  . albuterol (PROAIR HFA) 108 (90 BASE) MCG/ACT inhaler Inhale 2 puffs into the lungs 4 (four) times daily as needed for wheezing.  9 g  1  . aspirin EC 81 MG tablet Take 81 mg by mouth daily.      Marland Kitchen atorvastatin (LIPITOR) 40 MG tablet Take 1 tablet (40 mg total) by mouth daily at 6 PM.  30 tablet  5  . carvedilol (COREG) 25 MG tablet Take 1 tablet (25 mg total) by mouth 2 (two) times daily.  60 tablet  3  . citalopram (CELEXA) 40 MG tablet Take 1 tablet by mouth  daily  90 tablet  3  . cyclobenzaprine (FLEXERIL) 10 MG tablet Take 1 tablet at bedtime as needed for muscle spasms  30 tablet  0  . fenofibrate 160 MG tablet Take 1 tablet (160 mg total) by mouth daily.  90 tablet  3  . fluticasone (FLONASE) 50 MCG/ACT nasal spray Place 2 sprays into both nostrils daily.      Marland Kitchen gabapentin (NEURONTIN) 300 MG capsule Take 1 capsule (300 mg total) by mouth 3 (three) times daily.  270 capsule  3  . lisinopril-hydrochlorothiazide (PRINZIDE,ZESTORETIC) 20-25 MG per tablet Take 1 tablet by mouth 2 (two) times daily.  60 tablet  3  . meloxicam (MOBIC) 15 MG tablet Take 1 tablet (15 mg total) by mouth daily.  90 tablet  3  . metFORMIN (GLUCOPHAGE) 1000 MG tablet Take 1500 mg with breakfast and 1000 mg with dinner. Do not exceed 2500 mg per day  225 tablet  3  . nitroGLYCERIN (NITROLINGUAL) 0.4 MG/SPRAY spray Place 1 spray under the tongue every 5 (five) minutes x 3 doses as needed for chest pain.  12 g  12  . NON FORMULARY Take 1 mL by  mouth once a week. Nugenix-- Testosterone Booster      . oxyCODONE-acetaminophen (PERCOCET) 10-325 MG per tablet Take 1 tablet by mouth every 4 (four) hours as needed for pain.      . pantoprazole (PROTONIX) 20 MG tablet Take 1 tablet (20 mg total) by mouth 2 (two) times daily.  90 tablet  3  . tadalafil (CIALIS) 5 MG tablet Take 1 tablet (5 mg total) by mouth daily as needed for erectile dysfunction. 1 po qd  10 tablet  0  . methylphenidate (RITALIN) 10 MG tablet Take 1 tablet (10 mg total) by mouth 3 (three) times daily.  90 tablet  0  . traMADol (ULTRAM) 50 MG tablet Take 1 tablet (50 mg total) by mouth 3 (three) times daily as needed.  90 tablet  0   No current facility-administered medications for this visit.    Past Medical History  Diagnosis Date  . Diabetes mellitus   . Hypertension   . Cancer of kidney -status post nephrectomy     Kidney cancer 1999  . Hyperlipidemia   . Sleep apnea   . SVT (supraventricular  tachycardia)     Documentation pending but occurring during the stress test with Dr. 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 GERD (gastroesophageal reflux disease)   . Family history of long QT syndrome   . CAD (coronary artery disease)     a. LHC (12/19/13):  mid LAD 50-60%, EF 55%, patent CFX and RCA - med Rx.  Marland Kitchen Hx of echocardiogram     a.  Echo (12/2013):  mod LVH, EF 55-60%, no RWMA, mild LAE    Past Surgical History  Procedure Laterality Date  . Uvulopalatopharyngoplasty      08/1988  . Tonsillectomy      08/1988  . Cholecystectomy      2000  . Nephrectomy      L---1999 Cancer  . Nasal sinus surgery      2005  . Elbow surgery      left  . Inner ear surgery      R ear  . Ablation  08/27/13    AVNRT ablatin by Dr Lovena Le    Family History  Problem Relation Age of Onset  . Diabetes Father   . Hypertension Father   . Hyperlipidemia Father   . Colon polyps Father   . Hypertension Mother   . Diabetes Mother   . Colon cancer Neg Hx   .  Stomach cancer Neg Hx     History   Social History  . Marital Status: Married    Spouse Name: Pamala Hurry    Number of Children: N/A  . Years of Education: N/A   Occupational History  . [employer]  . Smoking status: Former Smoker -- 2.00 packs/day for 25 years    Types: Cigarettes    Quit date: 03/20/1998  . Smokeless tobacco: Never Used  . Alcohol Use: Yes     Comment: rare  . Drug Use: No  . Sexual Activity: Yes    Partners: Female, Male     Comment: swinger   Other Topics Concern  . Not on file   Social History Narrative   Exercise--- walking at work 1 mile    Review of Systems:  All systems reviewed.  They are negative to the above problem except as previously stated.  Vital Signs: BP 141/74  Pulse 62  Ht 5\' 10"  (1.778 m)  Wt 242 lb (109.77 kg)  BMI 34.72 kg/m2  Physical Exam Patient is in NAD HEENT:  Normocephalic, atraumatic. EOMI, PERRLA.  Neck: JVP is normal.  No bruits.  Lungs: clear to auscultation. No rales no wheezes.  Heart: Regular rate and rhythm. Normal S1, S2. No S3.   No significant murmurs. PMI not displaced.  Abdomen:  Supple, nontender. Normal bowel sounds. No masses. No hepatomegaly.  Extremities:   Good distal pulses throughout. No lower extremity edema.  Musculoskeletal :moving all extremities.  Neuro:   alert and oriented x3.  CN II-XII grossly intact.   Assessment and Plan: 1.  CAD  Continue medical Rx  2.  HTN  Adequate control

## 2014-01-19 NOTE — Telephone Encounter (Signed)
Last refill 6.14.15, last OV 5.20.15.  Please advise refill

## 2014-01-19 NOTE — Telephone Encounter (Signed)
methylphenidate (RITALIN) 10 MG tablet

## 2014-01-19 NOTE — Telephone Encounter (Signed)
Left message at [facility] and on VM of appoint. 7.14.15.

## 2014-01-19 NOTE — Telephone Encounter (Signed)
Ritalin requires appt

## 2014-01-19 NOTE — Patient Instructions (Addendum)
Will obtain labs today and call you with the results (bmet)  Your physician recommends that you continue on your current medications as directed. Please refer to the Current Medication list given to you today.

## 2014-01-20 ENCOUNTER — Encounter: Payer: Self-pay | Admitting: Adult Health

## 2014-01-20 ENCOUNTER — Ambulatory Visit (INDEPENDENT_AMBULATORY_CARE_PROVIDER_SITE_OTHER): Payer: 59 | Admitting: Adult Health

## 2014-01-20 VITALS — BP 152/70 | HR 77 | Resp 16 | Ht 70.0 in | Wt 244.8 lb

## 2014-01-20 DIAGNOSIS — F988 Other specified behavioral and emotional disorders with onset usually occurring in childhood and adolescence: Secondary | ICD-10-CM

## 2014-01-20 LAB — BASIC METABOLIC PANEL
BUN: 24 mg/dL — ABNORMAL HIGH (ref 6–23)
CO2: 26 mEq/L (ref 19–32)
Calcium: 9.8 mg/dL (ref 8.4–10.5)
Chloride: 101 mEq/L (ref 96–112)
Creatinine, Ser: 1.2 mg/dL (ref 0.4–1.5)
GFR: 66.53 mL/min (ref >60.00)
GLUCOSE: 113 mg/dL — AB (ref 70–99)
POTASSIUM: 3.8 meq/L (ref 3.5–5.1)
Sodium: 140 mEq/L (ref 135–145)

## 2014-01-20 MED ORDER — METHYLPHENIDATE HCL 10 MG PO TABS: 10.0000 mg | ORAL_TABLET | Freq: Three times a day (TID) | ORAL | Status: DC

## 2014-01-20 MED ORDER — TRAMADOL HCL 50 MG PO TABS: 50.0000 mg | ORAL_TABLET | Freq: Three times a day (TID) | ORAL | Status: DC | PRN

## 2014-01-20 NOTE — Progress Notes (Signed)
Pre visit review using our clinic review tool, if applicable. No additional management support is needed unless otherwise documented below in the visit note. 

## 2014-01-20 NOTE — Progress Notes (Signed)
Patient ID: Jesse Vargas, male   DOB: 02/24/1956, 58 y.o.   MRN: [id number]   Subjective:    Patient ID: Jesse Vargas, male    DOB: [birth year], 58 y.o.   MRN: [id number]  HPI 58 y/o male who presents to clinic for ADD follow up. On Ritalin. No side effects reported. Compliance with meds reported.  Past Medical History  Diagnosis Date  . Diabetes mellitus   . Hypertension   . Cancer of kidney -status post nephrectomy     Kidney cancer 1999  . Hyperlipidemia   . Sleep apnea   . SVT (supraventricular tachycardia)     Documentation pending but occurring during the stress test with Dr. 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 GERD (gastroesophageal reflux disease)   . Family history of long QT syndrome   . CAD (coronary artery disease)     a. LHC (12/19/13):  mid LAD 50-60%, EF 55%, patent CFX and RCA - med Rx.  Marland Kitchen Hx of echocardiogram     a.  Echo (12/2013):  mod LVH, EF 55-60%, no RWMA, mild LAE    Current Outpatient Prescriptions on File Prior to Visit  Medication Sig Dispense Refill  . albuterol (PROAIR HFA) 108 (90 BASE) MCG/ACT inhaler Inhale 2 puffs into the lungs 4 (four) times daily as needed for wheezing.  9 g  1  . aspirin EC 81 MG tablet Take 81 mg by mouth daily.      Marland Kitchen atorvastatin (LIPITOR) 40 MG tablet Take 1 tablet (40 mg total) by mouth daily at 6 PM.  30 tablet  5  . carvedilol (COREG) 25 MG tablet Take 1 tablet (25 mg total) by mouth 2 (two) times daily.  60 tablet  3  . citalopram (CELEXA) 40 MG tablet Take 1 tablet by mouth  daily  90 tablet  3  . cyclobenzaprine (FLEXERIL) 10 MG tablet Take 1 tablet at bedtime as needed for muscle spasms  30 tablet  0  . fenofibrate 160 MG tablet Take 1 tablet (160 mg total) by mouth daily.  90 tablet  3  . fluticasone (FLONASE) 50 MCG/ACT nasal spray Place 2 sprays into both nostrils daily.      Marland Kitchen gabapentin (NEURONTIN) 300 MG capsule Take 1 capsule (300 mg total) by mouth 3 (three) times daily.  270 capsule  3  .  lisinopril-hydrochlorothiazide (PRINZIDE,ZESTORETIC) 20-25 MG per tablet Take 1 tablet by mouth 2 (two) times daily.  60 tablet  3  . meloxicam (MOBIC) 15 MG tablet Take 1 tablet (15 mg total) by mouth daily.  90 tablet  3  . metFORMIN (GLUCOPHAGE) 1000 MG tablet Take 1500 mg with breakfast and 1000 mg with dinner. Do not exceed 2500 mg per day  225 tablet  3  . methylphenidate (RITALIN) 10 MG tablet Take 1 tablet (10 mg total) by mouth 3 (three) times daily.  90 tablet  0  . nitroGLYCERIN (NITROLINGUAL) 0.4 MG/SPRAY spray Place 1 spray under the tongue every 5 (five) minutes x 3 doses as needed for chest pain.  12 g  12  . NON FORMULARY Take 1 mL by mouth once a week. Nugenix-- Testosterone Booster      . oxyCODONE-acetaminophen (PERCOCET) 10-325 MG per tablet Take 1 tablet by mouth every 4 (four) hours as needed for pain.      . pantoprazole (PROTONIX) 20 MG tablet Take 1 tablet (20 mg total) by mouth 2 (two) times daily.  90 tablet  3  . tadalafil (CIALIS) 5 MG tablet Take 1 tablet (5 mg total) by mouth daily as needed for erectile dysfunction. 1 po qd  10 tablet  0  . traMADol (ULTRAM) 50 MG tablet Take 1 tablet (50 mg total) by mouth 3 (three) times daily as needed for pain.  120 tablet  0   No current facility-administered medications on file prior to visit.     Review of Systems  Constitutional: Negative.   Respiratory: Negative.  Negative for chest tightness and shortness of breath.   Cardiovascular: Negative.  Negative for chest pain, palpitations and leg swelling.  Gastrointestinal: Negative.   Genitourinary: Negative.   Musculoskeletal: Negative.   Neurological: Negative for dizziness and light-headedness.  Psychiatric/Behavioral: Negative.  Negative for sleep disturbance. The patient is not nervous/anxious.   All other systems reviewed and are negative.      Objective:  Ht 5\' 10"  (1.778 m)   Physical Exam  Constitutional: He is oriented to person, place, and time. He  appears well-developed and well-nourished. No distress.  HENT:  Head: Normocephalic and atraumatic.  Eyes: Conjunctivae and EOM are normal.  Neck: Neck supple.  Cardiovascular: Normal rate and regular rhythm.   Pulmonary/Chest: Effort normal. No respiratory distress.  Musculoskeletal: Normal range of motion.  Neurological: He is alert and oriented to person, place, and time. Coordination normal.  Skin: Skin is warm and dry.  Psychiatric: He has a normal mood and affect. His behavior is normal. Judgment and thought content normal.      Assessment & Plan:   1. Adult Attention Deficit Disorder Compliance with medication. Refills provided. Prescriptions x 3 months given. He will follow up in 3 months

## 2014-01-28 ENCOUNTER — Other Ambulatory Visit: Payer: Self-pay | Admitting: Adult Health

## 2014-02-11 ENCOUNTER — Other Ambulatory Visit: Payer: 59

## 2014-02-16 ENCOUNTER — Ambulatory Visit: Payer: 59 | Admitting: Internal Medicine

## 2014-02-23 ENCOUNTER — Other Ambulatory Visit: Payer: Self-pay | Admitting: Adult Health

## 2014-02-23 MED ORDER — TRAMADOL HCL 50 MG PO TABS: 50.0000 mg | ORAL_TABLET | Freq: Three times a day (TID) | ORAL | Status: DC | PRN

## 2014-02-23 MED ORDER — TADALAFIL 5 MG PO TABS: 5.0000 mg | ORAL_TABLET | Freq: Every day | ORAL | Status: DC | PRN

## 2014-02-23 NOTE — Telephone Encounter (Signed)
Ok refill? 

## 2014-02-24 ENCOUNTER — Encounter: Payer: Self-pay | Admitting: Adult Health

## 2014-03-09 ENCOUNTER — Other Ambulatory Visit: Payer: Self-pay | Admitting: Internal Medicine

## 2014-03-10 ENCOUNTER — Other Ambulatory Visit: Payer: Self-pay

## 2014-03-23 ENCOUNTER — Encounter: Payer: Self-pay | Admitting: Adult Health

## 2014-03-24 ENCOUNTER — Other Ambulatory Visit: Payer: Self-pay | Admitting: *Deleted

## 2014-03-24 MED ORDER — FLUTICASONE PROPIONATE 50 MCG/ACT NA SUSP: 2.0000 | Freq: Every day | NASAL | Status: DC

## 2014-03-27 ENCOUNTER — Encounter: Payer: Self-pay | Admitting: Adult Health

## 2014-03-27 ENCOUNTER — Ambulatory Visit (INDEPENDENT_AMBULATORY_CARE_PROVIDER_SITE_OTHER): Payer: 59 | Admitting: Adult Health

## 2014-03-27 VITALS — BP 161/95 | HR 62 | Temp 97.7°F | Resp 14 | Ht 70.0 in | Wt 243.2 lb

## 2014-03-27 DIAGNOSIS — L853 Xerosis cutis: Secondary | ICD-10-CM

## 2014-03-27 DIAGNOSIS — L851 Acquired keratosis [keratoderma] palmaris et plantaris: Secondary | ICD-10-CM

## 2014-03-27 DIAGNOSIS — Z79899 Other long term (current) drug therapy: Secondary | ICD-10-CM

## 2014-03-27 MED ORDER — FLUOXETINE HCL 20 MG PO TABS
20.0000 mg | ORAL_TABLET | Freq: Every day | ORAL | Status: DC
Start: 2014-03-27 — End: 2014-06-09

## 2014-03-27 MED ORDER — TADALAFIL 5 MG PO TABS: 5.0000 mg | ORAL_TABLET | Freq: Every day | ORAL | Status: DC

## 2014-03-27 MED ORDER — ATORVASTATIN CALCIUM 40 MG PO TABS
40.0000 mg | ORAL_TABLET | Freq: Every day | ORAL | Status: DC
Start: 2014-03-27 — End: 2014-11-10

## 2014-03-27 NOTE — Progress Notes (Signed)
Patient ID: Jesse Vargas, male   DOB: 1956-01-09, 58 y.o.   MRN: [id number]   Subjective:    Patient ID: Jesse Vargas, male    DOB: Dec 05, 1955, 58 y.o.   MRN: [id number]  HPI  Pt is a 58 y/o male who presents to clinic with concerns of having an infection in his right middle finger. He reports that he punctured the skin with a needle after sterilizing it. He denies any current pain or oozing from the site. No fever or chills. No redness reported.  Past Medical History  Diagnosis Date  . Diabetes mellitus   . Hypertension   . Cancer of kidney -status post nephrectomy     Kidney cancer 1999  . Hyperlipidemia   . Sleep apnea   . SVT (supraventricular tachycardia)     Documentation pending but occurring during the stress test with Dr. 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 GERD (gastroesophageal reflux disease)   . Family history of long QT syndrome   . CAD (coronary artery disease)     a. LHC (12/19/13):  mid LAD 50-60%, EF 55%, patent CFX and RCA - med Rx.  Marland Kitchen Hx of echocardiogram     a.  Echo (12/2013):  mod LVH, EF 55-60%, no RWMA, mild LAE    Current Outpatient Prescriptions on File Prior to Visit  Medication Sig Dispense Refill  . albuterol (PROAIR HFA) 108 (90 BASE) MCG/ACT inhaler Inhale 2 puffs into the lungs 4 (four) times daily as needed for wheezing.  9 g  1  . aspirin EC 81 MG tablet Take 81 mg by mouth daily.      . carvedilol (COREG) 25 MG tablet Take 1 tablet by mouth two  times daily  30 tablet  6  . cyclobenzaprine (FLEXERIL) 10 MG tablet Take 1 tablet at bedtime as needed for muscle spasms  30 tablet  0  . fenofibrate 160 MG tablet Take 1 tablet (160 mg total) by mouth daily.  90 tablet  3  . fluticasone (FLONASE) 50 MCG/ACT nasal spray Place 2 sprays into both nostrils daily.  48 g  1  . gabapentin (NEURONTIN) 300 MG capsule Take 1 capsule (300 mg total) by mouth 3 (three) times daily.  270 capsule  3  . lisinopril-hydrochlorothiazide  (PRINZIDE,ZESTORETIC) 20-25 MG per tablet Take 1 tablet by mouth two  times daily  90 tablet  1  . meloxicam (MOBIC) 15 MG tablet Take 1 tablet (15 mg total) by mouth daily.  90 tablet  3  . metFORMIN (GLUCOPHAGE) 1000 MG tablet Take 1500 mg with breakfast and 1000 mg with dinner. Do not exceed 2500 mg per day  225 tablet  3  . methylphenidate (RITALIN) 10 MG tablet Take 1 tablet (10 mg total) by mouth 3 (three) times daily.  90 tablet  0  . nitroGLYCERIN (NITROLINGUAL) 0.4 MG/SPRAY spray Place 1 spray under the tongue every 5 (five) minutes x 3 doses as needed for chest pain.  12 g  12  . NON FORMULARY Take 1 mL by mouth once a week. Nugenix-- Testosterone Booster      . oxyCODONE-acetaminophen (PERCOCET) 10-325 MG per tablet Take 1 tablet by mouth every 4 (four) hours as needed for pain.      . pantoprazole (PROTONIX) 20 MG tablet Take 1 tablet by mouth two  times daily  60 tablet  3  . traMADol (ULTRAM) 50 MG tablet Take 1 tablet (50 mg total) by mouth  3 (three) times daily as needed.  90 tablet  0   No current facility-administered medications on file prior to visit.     Review of Systems  Constitutional: Negative for fever and chills.  Skin: Negative for rash and wound.       Concerns about infection of right middle finger of hand       Objective:  BP 161/95  Pulse 62  Temp(Src) 97.7 F (36.5 C) (Oral)  Resp 14  Ht 5\' 10"  (1.778 m)  Wt 243 lb 4 oz (110.337 kg)  BMI 34.90 kg/m2  SpO2 96%   Physical Exam  Constitutional: He is oriented to person, place, and time. No distress.  HENT:  Head: Normocephalic and atraumatic.  Eyes: Conjunctivae and EOM are normal.  Neck: Neck supple.  Cardiovascular: Normal rate and regular rhythm.   Pulmonary/Chest: Effort normal. No respiratory distress.  Musculoskeletal: Normal range of motion.  Neurological: He is alert and oriented to person, place, and time. Coordination normal.  Skin: Skin is warm and dry. No rash noted. No erythema.    Dry, broken skin on the right corner of nail on right middle finger. No s/s of infection noted.  Psychiatric: He has a normal mood and affect. His behavior is normal. Judgment and thought content normal.      Assessment & Plan:   1. Dry skin There is no s/s of infection of the nail or skin on the right middle finger. Advised against puncturing skin with any object. If area becomes swollen or painful recommend that he schedule an appointment to be seen.   2. Medication Management Meds were reviewed and refills sent to pharmacy as needed.

## 2014-03-27 NOTE — Progress Notes (Signed)
Pre visit review using our clinic review tool, if applicable. No additional management support is needed unless otherwise documented below in the visit note. 

## 2014-04-01 ENCOUNTER — Ambulatory Visit (INDEPENDENT_AMBULATORY_CARE_PROVIDER_SITE_OTHER): Payer: 59 | Admitting: Cardiovascular Disease

## 2014-04-01 VITALS — BP 147/87 | HR 71 | Ht 68.0 in | Wt 240.9 lb

## 2014-04-01 DIAGNOSIS — Z9989 Dependence on other enabling machines and devices: Principal | ICD-10-CM

## 2014-04-01 DIAGNOSIS — I10 Essential (primary) hypertension: Secondary | ICD-10-CM

## 2014-04-01 DIAGNOSIS — Z8679 Personal history of other diseases of the circulatory system: Secondary | ICD-10-CM

## 2014-04-01 DIAGNOSIS — G4733 Obstructive sleep apnea (adult) (pediatric): Secondary | ICD-10-CM

## 2014-04-01 DIAGNOSIS — E785 Hyperlipidemia, unspecified: Secondary | ICD-10-CM

## 2014-04-01 NOTE — Patient Instructions (Signed)
Your physician has recommended that you have CPAP titration  sleep study. This test records several body functions during sleep, including: brain activity, eye movement, oxygen and carbon dioxide blood levels, heart rate and rhythm, breathing rate and rhythm, the flow of air through your mouth and nose, snoring, body muscle movements, and chest and belly movement.  Your physician recommends that you schedule a follow-up appointment in: 3-4 months in [facility].

## 2014-04-03 ENCOUNTER — Encounter: Payer: Self-pay | Admitting: Cardiovascular Disease

## 2014-04-03 NOTE — Progress Notes (Signed)
Patient ID: Jesse Vargas, male   DOB: 05-04-1956, 58 y.o.   MRN: [id number]     HPI: Jesse Vargas is a 58 y.o. male who presents for sleep clinic for evaluation of sleep apnea.  Jesse Vargas is a cardiology patient of Dr. Wyatt Haste.  He has a history of obesity, hypertension, diabetes mellitus, neuropathy, GERD, as well as arrhythmia.  He status post AV node ablation by Dr. Lovena Le for supraventricular tachycardia.   He states that he has had a long-standing history of sleep apnea.  In 1990 he underwent UPPP surgery in [place].  He has been using CPAP therapy for over 15 years and has a very old Fisher-Paykel machine, which is not palpable.  We did try to interrogate the machine today in the office and this is set at 13 cm water pressure.  Apparently, he was referred by Dr. Della Goo and for a diagnostic polysomnogram, which was done at [facility] in November 2014.  The patient states he never had any followup of this diagnostic polysomnogram.  I was able to obtain the record of this today and apparently this revealed very severe sleep apnea with an AHI of 94.6, and an RDI of 102.6.  He was recently in the hospital and became aware I was involved with sleep medicine.  He now presents to the office today to establish care with me with reference to his obstructive sleep apnea. Presently, he admits to going to bed between 10 PM and midnight and wakes up approximately 6 AM.  He does wake up several times per night.  His sleep is still non-restorative.  An Epworth Sleepiness Scale today was calculated at 16 which is compatible with significant hypersomnolence despite him using his old CPAP unit.   Epworth Sleepiness Scale: Situation   Chance of Dozing/Sleeping (0 = never , 1 = slight chance , 2 = moderate chance , 3 = high chance )   sitting and reading 3   watching TV 1   sitting inactive in a public place 3   being a passenger in a motor vehicle for an hour or more 3   lying down in the  afternoon 3   sitting and talking to someone 0   sitting quietly after lunch (no alcohol) 3   while stopped for a few minutes in traffic as the driver 0   Total Score  16    Past Medical History  Diagnosis Date  . Diabetes mellitus   . Hypertension   . Cancer of kidney -status post nephrectomy     Kidney cancer 1999  . Hyperlipidemia   . Sleep apnea   . SVT (supraventricular tachycardia)     Documentation pending but occurring during the stress test with Dr. 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 GERD (gastroesophageal reflux disease)   . Family history of long QT syndrome   . CAD (coronary artery disease)     a. LHC (12/19/13):  mid LAD 50-60%, EF 55%, patent CFX and RCA - med Rx.  Marland Kitchen Hx of echocardiogram     a.  Echo (12/2013):  mod LVH, EF 55-60%, no RWMA, mild LAE    Past Surgical History  Procedure Laterality Date  . Uvulopalatopharyngoplasty      08/1988  . Tonsillectomy      08/1988  . Cholecystectomy      2000  . Nephrectomy      L---1999 Cancer  . Nasal sinus surgery  2005  . Elbow surgery      left  . Inner ear surgery      R ear  . Ablation  08/27/13    AVNRT ablatin by Dr Lovena Le    Allergies  Allergen Reactions  . Sulfonamide Derivatives Hives  . Vicodin [Hydrocodone-Acetaminophen] Rash    Current Outpatient Prescriptions  Medication Sig Dispense Refill  . albuterol (PROAIR HFA) 108 (90 BASE) MCG/ACT inhaler Inhale 2 puffs into the lungs 4 (four) times daily as needed for wheezing.  9 g  1  . aspirin EC 81 MG tablet Take 81 mg by mouth daily.      Marland Kitchen atorvastatin (LIPITOR) 40 MG tablet Take 1 tablet (40 mg total) by mouth daily at 6 PM.  90 tablet  1  . carvedilol (COREG) 25 MG tablet Take 1 tablet by mouth two  times daily  30 tablet  6  . cetirizine (ZYRTEC) 10 MG tablet Take 10 mg by mouth as needed.       . cyclobenzaprine (FLEXERIL) 10 MG tablet Take 1 tablet at bedtime as needed for muscle spasms  30 tablet  0  . fenofibrate 160  MG tablet Take 1 tablet (160 mg total) by mouth daily.  90 tablet  3  . FLUoxetine (PROZAC) 20 MG tablet Take 1 tablet (20 mg total) by mouth daily.  30 tablet  3  . fluticasone (FLONASE) 50 MCG/ACT nasal spray Place 2 sprays into both nostrils daily.  48 g  1  . gabapentin (NEURONTIN) 300 MG capsule Take 1 capsule (300 mg total) by mouth 3 (three) times daily.  270 capsule  3  . GLUTATHIONE PO Take 500 mg by mouth daily.      Marland Kitchen lisinopril-hydrochlorothiazide (PRINZIDE,ZESTORETIC) 20-25 MG per tablet Take 1 tablet by mouth two  times daily  90 tablet  1  . meloxicam (MOBIC) 15 MG tablet Take 1 tablet (15 mg total) by mouth daily.  90 tablet  3  . metFORMIN (GLUCOPHAGE) 1000 MG tablet Take 1500 mg with breakfast and 1000 mg with dinner. Do not exceed 2500 mg per day  225 tablet  3  . methylphenidate (RITALIN) 10 MG tablet Take 1 tablet (10 mg total) by mouth 3 (three) times daily.  90 tablet  0  . nitroGLYCERIN (NITROLINGUAL) 0.4 MG/SPRAY spray Place 1 spray under the tongue every 5 (five) minutes x 3 doses as needed for chest pain.  12 g  12  . NON FORMULARY Take 1 mL by mouth once a week. Nugenix-- Testosterone Booster      . oxyCODONE-acetaminophen (PERCOCET) 10-325 MG per tablet Take 1 tablet by mouth every 4 (four) hours as needed for pain.      . pantoprazole (PROTONIX) 20 MG tablet Take 1 tablet by mouth two  times daily  60 tablet  3  . tadalafil (CIALIS) 5 MG tablet Take 1 tablet (5 mg total) by mouth daily.  90 tablet  1  . testosterone cypionate (DEPOTESTOTERONE CYPIONATE) 200 MG/ML injection Inject 1 mL into the muscle once a week.      . traMADol (ULTRAM) 50 MG tablet Take 1 tablet (50 mg total) by mouth 3 (three) times daily as needed.  90 tablet  0   No current facility-administered medications for this visit.    History   Social History  . Marital Status: Married    Spouse Name: Pamala Hurry    Number of Children: N/A  . 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 Smoking status: Former Smoker -- 2.00 packs/day for 25 years    Types: Cigarettes    Quit date: 03/20/1998  . Smokeless tobacco: Never Used  . Alcohol Use: Yes     Comment: rare  . Drug Use: No  . Sexual Activity: Yes    Partners: Female, Male     Comment: swinger   Other Topics Concern  . Not on file   Social History Narrative   Exercise--- walking at work 1 mile    Additional social history is notable that he is married for 33 years.  He has 4 children.  ROS General: Negative; No fevers, chills, or night sweats HEENT: Negative; No changes in vision or hearing, sinus congestion, difficulty swallowing Pulmonary: Negative; No cough, wheezing, shortness of breath, hemoptysis Cardiovascular: Negative; No chest pain, presyncope, syncope, palpatations GI: Negative; No nausea, vomiting, diarrhea, or abdominal pain GU: Negative; No dysuria, hematuria, or difficulty voiding Musculoskeletal: Negative; no myalgias, joint pain, or weakness Hematologic: Negative; no easy bruising, bleeding Endocrine: Negative; no heat/cold intolerance Neuro: Negative; no changes in balance, headaches Skin: Negative; No rashes or skin lesions Psychiatric: Negative; No behavioral problems, depression Sleep: Negative; No daytime sleepiness, hypersomnolence, bruxism, restless legs, hypnogognic hallucinations, no cataplexy   Physical Exam BP 147/87  Pulse 71  Ht 5\' 8"  (1.727 m)  Wt 240 lb 14.4 oz (109.272 kg)  BMI 36.64 kg/m2  General: Alert, oriented, no distress.  Skin: normal turgor, no rashes HEENT: Normocephalic, atraumatic. Pupils round and reactive; sclera anicteric; extraocular muscles intact; Fundi no hemorrhages or exudates. Nose without nasal septal hypertrophy Mouth/Parynx benign; Mallinpatti scale S/P UPPP surgery Neck: No JVD, no carotid briuts Lungs: clear to ausculatation and percussion; no wheezing or rales  Chest wall: No  tenderness to palpation Heart: RRR, s1 s2 normal 1/6 systolic murmur.  No S3 gallop.  No diastolic murmur. Abdomen: soft, nontender; no hepatosplenomehaly, BS+; abdominal aorta nontender and not dilated by palpation. Back: No CVA tenderness Pulses 2+ Extremities: no clubbinbg cyanosis or edema, Homan's sign negative  Neurologic: grossly nonfocal; cranial nerves intact. Psychological: Normal affect and mood.   LABS:  BMET    Component Value Date/Time   NA 140 01/19/2014 1622   K 3.8 01/19/2014 1622   CL 101 01/19/2014 1622   CO2 26 01/19/2014 1622   GLUCOSE 113* 01/19/2014 1622   BUN 24* 01/19/2014 1622   CREATININE 1.2 01/19/2014 1622   CALCIUM 9.8 01/19/2014 1622   GFRNONAA >90 12/19/2013 1829   GFRAA >90 12/19/2013 1829     Hepatic Function Panel     Component Value Date/Time   PROT 7.9 10/19/2011 1057   ALBUMIN 4.6 10/19/2011 1057   AST 44* 10/19/2011 1057   ALT 44 10/19/2011 1057   ALKPHOS 59 10/19/2011 1057   BILITOT 0.3 10/19/2011 1057   BILIDIR 0.1 10/19/2011 1057   IBILI 0.4 09/16/2010 2028     CBC    Component Value Date/Time   WBC 9.1 12/20/2013 0318   RBC 5.04 12/20/2013 0318   HGB 15.4 12/20/2013 0318   HCT 45.8 12/20/2013 0318   PLT 152 12/20/2013 0318   MCV 90.9 12/20/2013 0318   MCH 30.6 12/20/2013 0318   MCHC 33.6 12/20/2013 0318   RDW 14.1 12/20/2013 0318   LYMPHSABS 3.4 08/19/2013 1636   MONOABS 0.6 08/19/2013 1636   EOSABS 0.2 08/19/2013 1636   BASOSABS 0.1  08/19/2013 1636     BNP    Component Value Date/Time   PROBNP 99.3 12/18/2013 1241    Lipid Panel     Component Value Date/Time   CHOL 148 12/19/2013 0400   TRIG 434* 12/19/2013 0400   HDL 23* 12/19/2013 0400   CHOLHDL 6.4 12/19/2013 0400   VLDL UNABLE TO CALCULATE IF TRIGLYCERIDE OVER 400 mg/dL 12/19/2013 0400   LDLCALC UNABLE TO CALCULATE IF TRIGLYCERIDE OVER 400 mg/dL 12/19/2013 0400   LDLDIRECT 71.5 10/19/2011 1057     RADIOLOGY: No results found.    ASSESSMENT AND PLAN: Jesse Vargas is a  58 year old gentleman with at least a 25 year history of obstructive sleep apnea.  He underwent UPPP surgery in 1990.  He brought with him his old Fisher-Paykel machine, which is greater than 47 years old.  He is desperately in need of a new and supplies.  I did review his most recent download which was done at [facility] in November 2014.  This confirmed severe obstructive sleep apnea.  It is my recommendation that he be referred for a CPAP/possible BiPAP titration.  He will need a new machine and mask with  supplies following his CPAP/possible BiPAP titration.  We'll schedule this to be done at [facility].  I will then refer him to [facility] for his supplies, and I will see him back in the office in a sleep clinic for followup evaluation.  Time spent: 30 minutes   Troy Sine, MD, [facility]  04/03/2014 6:31 PM

## 2014-04-08 ENCOUNTER — Other Ambulatory Visit: Payer: Self-pay | Admitting: *Deleted

## 2014-04-08 ENCOUNTER — Encounter: Payer: Self-pay | Admitting: Cardiovascular Disease

## 2014-04-08 DIAGNOSIS — G4733 Obstructive sleep apnea (adult) (pediatric): Secondary | ICD-10-CM

## 2014-04-08 DIAGNOSIS — Z9989 Dependence on other enabling machines and devices: Principal | ICD-10-CM

## 2014-04-16 ENCOUNTER — Ambulatory Visit (HOSPITAL_BASED_OUTPATIENT_CLINIC_OR_DEPARTMENT_OTHER): Payer: 59 | Attending: Cardiovascular Disease

## 2014-04-16 VITALS — Ht 68.0 in | Wt 245.0 lb

## 2014-04-16 DIAGNOSIS — G4769 Other sleep related movement disorders: Secondary | ICD-10-CM | POA: Insufficient documentation

## 2014-04-16 DIAGNOSIS — G4733 Obstructive sleep apnea (adult) (pediatric): Secondary | ICD-10-CM | POA: Diagnosis not present

## 2014-04-16 DIAGNOSIS — Z9989 Dependence on other enabling machines and devices: Secondary | ICD-10-CM

## 2014-04-17 ENCOUNTER — Ambulatory Visit: Payer: 59 | Admitting: Cardiovascular Disease

## 2014-04-27 DIAGNOSIS — G4733 Obstructive sleep apnea (adult) (pediatric): Secondary | ICD-10-CM

## 2014-04-27 NOTE — Addendum Note (Signed)
Addended by: Leanor Rubenstein on: 04/27/2014 03:11 PM   Modules accepted: Level of Service

## 2014-04-27 NOTE — Sleep Study (Signed)
   NAME: Jesse Vargas DATE OF BIRTH:  19-Mar-1956 MEDICAL RECORD NUMBER [id number]  LOCATION: [facility]  PHYSICIAN: KELLY,THOMAS A  DATE OF STUDY: 04/16/2014  SLEEP STUDY TYPE: Positive Airway Pressure Titration               REFERRING PHYSICIAN: Troy Sine, MD  INDICATION FOR STUDY: Severe OSA with an AHI of 94.6/hr and RDI of 102.6/hr   EPWORTH SLEEPINESS SCORE:  12 HEIGHT: 5\' 8"  (172.7 cm)  WEIGHT: 245 lb (111.131 kg)    Body mass index is 37.26 kg/(m^2).  NECK SIZE: 18 in.  MEDICATIONS: Medications traMADol (ULTRAM) 50 MG tablet 50 mg, 3 times daily PRN testosterone cypionate (DEPOTESTOTERONE CYPIONATE) 200 MG/ML injection 1 mL, Weekly tadalafil (CIALIS) 5 MG tablet 5 mg, Daily pantoprazole (PROTONIX) 20 MG tablet oxyCODONE-acetaminophen (PERCOCET) 10-325 MG per tablet 1 tablet, Every 4 hours PRN NON FORMULARY 1 mL, Weekly nitroGLYCERIN (NITROLINGUAL) 0.4 MG/SPRAY spray 1 spray, Every 5 min x3 PRN methylphenidate (RITALIN) 10 MG tablet 10 mg, 3 times daily metFORMIN (GLUCOPHAGE) 1000 MG tablet meloxicam (MOBIC) 15 MG tablet 15 mg, Daily lisinopril-hydrochlorothiazide (PRINZIDE,ZESTORETIC) 20-25 MG per tablet GLUTATHIONE PO 500 mg, Daily gabapentin (NEURONTIN) 300 MG capsule 300 mg, 3 times daily fluticasone (FLONASE) 50 MCG/ACT nasal spray 2 spray, Daily FLUoxetine (PROZAC) 20 MG tablet 20 mg, Daily fenofibrate 160 MG tablet 160 mg, Daily cyclobenzaprine (FLEXERIL) 10 MG tablet cetirizine (ZYRTEC) 10 MG tablet 10 mg, As needed carvedilol (COREG) 25 MG tablet atorvastatin (LIPITOR) 40 MG tablet 40 mg, Daily-1800 aspirin EC 81 MG tablet 81 mg, Daily albuterol (PROAIR HFA) 108 (90 BASE) MCG/ACT inhaler 2 puff, 4 times daily PRN   SLEEP ARCHITECTURE: The patient had a total sleep time of 353 minutes with a sleep efficiency of 97.1%. Onset to REM sleep was 104.5 minutes.  The patient spent 14.5 minutes in stage I, 198.5 minutes in stage II, 0 minutes in stage III,  and 140 minutes in REM sleep.  Sleep architecture was abnormal with absence of slow-wave sleep.   RESPIRATORY DATA: CPAP was initiated at 5 cm and titrated to 15 cm. The patient had 0 apneas, 23 hypopneas.  Overall AHI was 16.5/hr. AHI was 3.2/hr at 15 cm, but the patient developed 4 central events during this 112 minutes  Of sleep at 15 cm.  The lowest 02 saturation at 15 cm was 90%.  OXYGEN DATA:  The mean oxygenation during sleep time was 93.4% with a maximum of 97% and minimum of 85%.  Minimum oxygen desaturation was 85% with non-REM sleep and 86% with REM sleep.  Time spent with oxygen saturation less than 90% was 4.8 minutes.  Time spent with oxygen saturation less than 88% was 0.8 minutes  CARDIAC DATA: Mean HR 69.8  MOVEMENT/PARASOMNIA: There were 290 periodic limb movements with an index of 49.3 and 33 PLM arousals with an index of 5.6.  IMPRESSION/ RECOMMENDATION: Severe obstructive sleep apnea documented on the patient's diagnostic polysomnogram of November 2014.  Recommend initial CPAP at 15 cm water pressure with download in 30 days and sleep clinic evaluation. Recommend weight loss with BMI 37. With a PLMS index of 49.3 if patient is symptomatic with restless legs, consider a trial of medical therapy.  Vivian, American Board of Sleep Medicine  ELECTRONICALLY SIGNED ON:  04/27/2014, 7:18 PM [place] PH: [phone]   FX: [phone] [place]

## 2014-04-29 ENCOUNTER — Telehealth: Payer: Self-pay | Admitting: Adult Health

## 2014-04-29 NOTE — Telephone Encounter (Signed)
Left message on VM needs appoint

## 2014-04-29 NOTE — Telephone Encounter (Signed)
Needs to be seen sooner. May have to be with another provider.

## 2014-04-29 NOTE — Telephone Encounter (Signed)
Jesse Vargas called saying he needs a refill for his Prozack and Ridlin. He's wondering if he needs to make an appt before being able to get the refills. Also, he'd like to have the Prozack in the capsule form, per the pharmacist, this will be cheaper for him. He uses the [facility] in [place]. Please call the patient if needed. Pt ph# [phone] Thank you.

## 2014-04-29 NOTE — Telephone Encounter (Signed)
The patient is out of his methylphenidate (RITALIN) 10 MG tablet and FLUoxetine (PROZAC) 20 MG tablet. He is needing his Prozac in capsule form the pharmacist informed him that it is much cheaper. He has an appointment scheduled on 11.3.15. If needing to be seen sooner please advise where to schedule the patient.

## 2014-04-29 NOTE — Telephone Encounter (Signed)
Pt of [facility], last OV 9.18.15.  Has scheduled appoint with Dr Gilford Rile on 11.3.15.  Please advise refills til seen.

## 2014-05-05 ENCOUNTER — Ambulatory Visit (INDEPENDENT_AMBULATORY_CARE_PROVIDER_SITE_OTHER): Payer: 59 | Admitting: Internal Medicine

## 2014-05-05 ENCOUNTER — Encounter: Payer: Self-pay | Admitting: Internal Medicine

## 2014-05-05 VITALS — BP 160/84 | HR 67 | Temp 98.2°F | Resp 16 | Ht 68.0 in | Wt 244.8 lb

## 2014-05-05 DIAGNOSIS — G4733 Obstructive sleep apnea (adult) (pediatric): Secondary | ICD-10-CM

## 2014-05-05 DIAGNOSIS — L0390 Cellulitis, unspecified: Secondary | ICD-10-CM

## 2014-05-05 DIAGNOSIS — F988 Other specified behavioral and emotional disorders with onset usually occurring in childhood and adolescence: Secondary | ICD-10-CM

## 2014-05-05 DIAGNOSIS — J069 Acute upper respiratory infection, unspecified: Secondary | ICD-10-CM

## 2014-05-05 DIAGNOSIS — L0291 Cutaneous abscess, unspecified: Secondary | ICD-10-CM

## 2014-05-05 DIAGNOSIS — F909 Attention-deficit hyperactivity disorder, unspecified type: Secondary | ICD-10-CM

## 2014-05-05 MED ORDER — CARVEDILOL 25 MG PO TABS: ORAL_TABLET | ORAL | Status: DC

## 2014-05-05 MED ORDER — METHYLPHENIDATE HCL 10 MG PO TABS: 10.0000 mg | ORAL_TABLET | Freq: Three times a day (TID) | ORAL | Status: DC

## 2014-05-05 MED ORDER — LISINOPRIL-HYDROCHLOROTHIAZIDE 20-25 MG PO TABS
1.0000 | ORAL_TABLET | Freq: Every day | ORAL | Status: DC
Start: 2014-05-05 — End: 2014-06-29

## 2014-05-05 MED ORDER — MUPIROCIN CALCIUM 2 % EX CREA: 1.0000 "application " | TOPICAL_CREAM | Freq: Two times a day (BID) | CUTANEOUS | Status: DC

## 2014-05-05 NOTE — Progress Notes (Signed)
Patient ID: Jesse Vargas, male   DOB: 13-Apr-1956, 58 y.o.   MRN: [id number]   Patient Active Problem List   Diagnosis Date Noted  . Chest pain with moderate risk of acute coronary syndrome 12/18/2013  . Fatigue 12/18/2013  . Chest pain 12/18/2013  . Screen for STD (sexually transmitted disease) 09/30/2013  . Medication management 09/30/2013  . Paroxysmal supraventricular tachycardia 08/27/2013  . Low testosterone 05/15/2013  . FHx: long QT syndrome 05/15/2013  . Obesity (BMI 30-39.9) 03/21/2013  . Ingrown left big toenail 03/21/2013  . Family history of long QT syndrome   . Fibromyalgia 07/09/2012  . CAD (coronary artery disease) - non-obstructive by [facility] 12/19/13 - med Rx 03/21/2011  . NEVI, MULTIPLE 07/25/2010  . 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 NEOPLASM, MALIGNANT, KIDNEY, LEFT 09/13/2009  . PROTEINURIA 09/13/2009  . ANXIETY DEPRESSION 08/13/2009  . Obstructive sleep apnea 08/13/2009  . ANXIETY STATE, UNSPECIFIED 05/10/2009  . BACK PAIN 05/10/2009  . ATTENTION DEFICIT DISORDER, ADULT 12/08/2008  . DIABETES MELLITUS, TYPE II 07/13/2008  . HYPERLIPIDEMIA 07/13/2008  . HYPERTENSION 07/13/2008    Subjective:  CC:   Chief Complaint  Patient presents with  . Follow-up    medication refill and right ear feels stopped up.    HPI:   Jesse Vargas is a 58 y.o. male who presents for   Follow up on multiple chronic and acute issues:  1)  Right ear feeling plugged for the  last week .  No history of travel or swimming.  Multiple sick contacts.    2) Insomnia: wanting a medication to help his insomnia,  Reviewed meds,  He is taking meds for ADD late in the afternoon . Regimen is ritalin at 8:00 12:00 and 4 pm.    3) OSA: he is wearing his CPAP an average of 8 hours per night.   4) nonhealing scalp lesion:  Top of head,  No recent dermatology evaluation,  Keeps picking at it,       Past Medical History  Diagnosis Date  .  Diabetes mellitus   . Hypertension   . Cancer of kidney -status post nephrectomy     Kidney cancer 1999  . Hyperlipidemia   . Sleep apnea   . SVT (supraventricular tachycardia)     Documentation pending but occurring during the stress test with Dr. 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 GERD (gastroesophageal reflux disease)   . Family history of long QT syndrome   . CAD (coronary artery disease)     a. LHC (12/19/13):  mid LAD 50-60%, EF 55%, patent CFX and RCA - med Rx.  Marland Kitchen Hx of echocardiogram     a.  Echo (12/2013):  mod LVH, EF 55-60%, no RWMA, mild LAE    Past Surgical History  Procedure Laterality Date  . Uvulopalatopharyngoplasty      08/1988  . Tonsillectomy      08/1988  . Cholecystectomy      2000  . Nephrectomy      L---1999 Cancer  . Nasal sinus surgery      2005  . Elbow surgery      left  . Inner ear surgery      R ear  . Ablation  08/27/13    AVNRT ablatin by Dr Lovena Le       The following portions of the patient's history were reviewed and updated as appropriate: Allergies, current medications, and problem list.  Review of Systems:   Patient denies headache, fevers, malaise, unintentional weight loss, skin rash, eye pain, sinus congestion and sinus pain, sore throat, dysphagia,  hemoptysis , cough, dyspnea, wheezing, chest pain, palpitations, orthopnea, edema, abdominal pain, nausea, melena, diarrhea, constipation, flank pain, dysuria, hematuria, urinary  Frequency, nocturia, numbness, tingling, seizures,  Focal weakness, Loss of consciousness,  Tremor, insomnia, depression, anxiety, and suicidal ideation.     History   Social History  . Marital Status: Married    Spouse Name: Jesse Vargas    Number of Children: N/A  . 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 Smoking status: Former Smoker -- 2.00 packs/day for 25 years    Types: Cigarettes    Quit date: 03/20/1998  .  Smokeless tobacco: Never Used  . Alcohol Use: Yes     Comment: rare  . Drug Use: No  . Sexual Activity: Yes    Partners: Female, Male     Comment: swinger   Other Topics Concern  . Not on file   Social History Narrative   Exercise--- walking at work 1 mile    Objective:  Filed Vitals:   05/05/14 1609  BP: 160/84  Pulse: 67  Temp: 98.2 F (36.8 C)  Resp: 16     General appearance: alert, cooperative and appears stated age Ears: normal TM's and external ear canals both ears Throat: lips, mucosa, and tongue normal; teeth and gums normal Neck: no adenopathy, no carotid bruit, supple, symmetrical, trachea midline and thyroid not enlarged, symmetric, no tenderness/mass/nodules Back: symmetric, no curvature. ROM normal. No CVA tenderness. Lungs: clear to auscultation bilaterally Heart: regular rate and rhythm, S1, S2 normal, no murmur, click, rub or gallop Abdomen: soft, non-tender; bowel sounds normal; no masses,  no organomegaly Pulses: 2+ and symmetric Skin: Skin color, texture, turgor normal. No rashes or lesions Lymph nodes: Cervical, supraclavicular, and axillary nodes normal.  Assessment and Plan:  Cellulitis and abscess Will treat his scalp lesion with mupirocin and if no improvement refer to Dermatology   Acute upper respiratory infection HEENT exam is normal.  Recommend sudafed PE  Obstructive sleep apnea Diagnosed by sleep study. he is wearing her CPAP every night a minimum of 8 hours per night and notes improved daytime wakefulness and decreased fatigue    Updated Medication List Outpatient Encounter Prescriptions as of 05/05/2014  Medication Sig  . albuterol (PROAIR HFA) 108 (90 BASE) MCG/ACT inhaler Inhale 2 puffs into the lungs 4 (four) times daily as needed for wheezing.  Marland Kitchen aspirin EC 81 MG tablet Take 81 mg by mouth daily.  Marland Kitchen atorvastatin (LIPITOR) 40 MG tablet Take 1 tablet (40 mg total) by mouth daily at 6 PM.  . carvedilol (COREG) 25 MG tablet  Take 1/2 tablet by mouth two  times daily  . cetirizine (ZYRTEC) 10 MG tablet Take 10 mg by mouth as needed.   . cyclobenzaprine (FLEXERIL) 10 MG tablet Take 1 tablet at bedtime as needed for muscle spasms  . fenofibrate 160 MG tablet Take 1 tablet (160 mg total) by mouth daily.  Marland Kitchen FLUoxetine (PROZAC) 20 MG tablet Take 1 tablet (20 mg total) by mouth daily.  . fluticasone (FLONASE) 50 MCG/ACT nasal spray Place 2 sprays into both nostrils daily.  Marland Kitchen gabapentin (NEURONTIN) 300 MG capsule Take 1 capsule (300 mg total) by mouth 3 (three) times daily.  Marland Kitchen GLUTATHIONE PO Take 500 mg by  mouth daily.  Marland Kitchen lisinopril-hydrochlorothiazide (PRINZIDE,ZESTORETIC) 20-25 MG per tablet Take 1 tablet by mouth daily.  . meloxicam (MOBIC) 15 MG tablet Take 1 tablet (15 mg total) by mouth daily.  . metFORMIN (GLUCOPHAGE) 1000 MG tablet Take 1500 mg with breakfast and 1000 mg with dinner. Do not exceed 2500 mg per day  . methylphenidate (RITALIN) 10 MG tablet Take 1 tablet (10 mg total) by mouth 3 (three) times daily.  . nitroGLYCERIN (NITROLINGUAL) 0.4 MG/SPRAY spray Place 1 spray under the tongue every 5 (five) minutes x 3 doses as needed for chest pain.  . NON FORMULARY Take 1 mL by mouth once a week. Nugenix-- Testosterone Booster  . oxyCODONE-acetaminophen (PERCOCET) 10-325 MG per tablet Take 1 tablet by mouth every 4 (four) hours as needed for pain.  . pantoprazole (PROTONIX) 20 MG tablet Take 1 tablet by mouth two  times daily  . tadalafil (CIALIS) 5 MG tablet Take 1 tablet (5 mg total) by mouth daily.  Marland Kitchen testosterone cypionate (DEPOTESTOTERONE CYPIONATE) 200 MG/ML injection Inject 1 mL into the muscle once a week.  . traMADol (ULTRAM) 50 MG tablet Take 1 tablet (50 mg total) by mouth 3 (three) times daily as needed.  . [DISCONTINUED] carvedilol (COREG) 25 MG tablet Take 1 tablet by mouth two  times daily  . [DISCONTINUED] lisinopril-hydrochlorothiazide (PRINZIDE,ZESTORETIC) 20-25 MG per tablet Take 1 tablet by  mouth two  times daily  . [DISCONTINUED] methylphenidate (RITALIN) 10 MG tablet Take 1 tablet (10 mg total) by mouth 3 (three) times daily.  . [DISCONTINUED] methylphenidate (RITALIN) 10 MG tablet Take 1 tablet (10 mg total) by mouth 3 (three) times daily.  . [DISCONTINUED] methylphenidate (RITALIN) 10 MG tablet Take 1 tablet (10 mg total) by mouth 3 (three) times daily.  . [DISCONTINUED] methylphenidate (RITALIN) 10 MG tablet Take 1 tablet (10 mg total) by mouth 3 (three) times daily.  . mupirocin cream (BACTROBAN) 2 % Apply 1 application topically 2 (two) times daily.     No orders of the defined types were placed in this encounter.    No Follow-up on file.

## 2014-05-05 NOTE — Patient Instructions (Signed)
  For the ear::  Sudafed PE 10 mg every 6 hours omit evening dose  Call if no better in one week  For scalp:  Topical antibiotic apply for two weeks.  If no better ,, dermatology   Your 4:00 dose of ritalin is interfering with sleep ,  Take it by 2 pm or omit entirely   You are due for labs

## 2014-05-05 NOTE — Progress Notes (Signed)
Pre-visit discussion using our clinic review tool. No additional management support is needed unless otherwise documented below in the visit note.  

## 2014-05-06 ENCOUNTER — Encounter: Payer: Self-pay | Admitting: Internal Medicine

## 2014-05-06 NOTE — Assessment & Plan Note (Signed)
HEENT exam is normal.  Recommend sudafed PE

## 2014-05-06 NOTE — Assessment & Plan Note (Signed)
Will treat his scalp lesion with mupirocin and if no improvement refer to Dermatology

## 2014-05-06 NOTE — Assessment & Plan Note (Signed)
Diagnosed by sleep study. he is wearing her CPAP every night a minimum of 8 hours per night and notes improved daytime wakefulness and decreased fatigue

## 2014-05-12 ENCOUNTER — Ambulatory Visit: Payer: 59 | Admitting: Internal Medicine

## 2014-05-12 ENCOUNTER — Encounter: Payer: Self-pay | Admitting: Cardiovascular Disease

## 2014-05-14 ENCOUNTER — Telehealth: Payer: Self-pay | Admitting: *Deleted

## 2014-05-14 ENCOUNTER — Encounter: Payer: Self-pay | Admitting: *Deleted

## 2014-05-14 NOTE — Telephone Encounter (Signed)
CPAP order, sleep study and office notes sent to [facility] for patient set up.

## 2014-05-14 NOTE — Telephone Encounter (Signed)
Left message for patient to return a call in reference to his sleep apnea diagnosis.

## 2014-05-15 NOTE — Telephone Encounter (Signed)
Telephoned the patient to verbally notify him of the sleep process. Informed him that the paperwork has been sent to [facility] supply. Once his insurance benefits has been verified they will contact him for set-up. He will see Dr.Kelly approximately two month after initiation of his therapy. Patient voiced understanding of this conversation.

## 2014-05-21 ENCOUNTER — Telehealth: Payer: Self-pay | Admitting: *Deleted

## 2014-05-21 ENCOUNTER — Other Ambulatory Visit: Payer: Self-pay | Admitting: Internal Medicine

## 2014-05-21 NOTE — Telephone Encounter (Signed)
Faxed CPAP order to [facility].

## 2014-05-22 ENCOUNTER — Other Ambulatory Visit: Payer: Self-pay | Admitting: *Deleted

## 2014-05-22 MED ORDER — MELOXICAM 15 MG PO TABS: 15.0000 mg | ORAL_TABLET | Freq: Every day | ORAL | Status: DC

## 2014-05-22 MED ORDER — PANTOPRAZOLE SODIUM 20 MG PO TBEC: DELAYED_RELEASE_TABLET | ORAL | Status: DC

## 2014-05-22 NOTE — Telephone Encounter (Signed)
Ok to refill,  Refill sent  

## 2014-05-22 NOTE — Telephone Encounter (Signed)
Ok refill, last visit 05/05/14 

## 2014-05-25 ENCOUNTER — Other Ambulatory Visit: Payer: Self-pay | Admitting: Internal Medicine

## 2014-05-25 NOTE — Telephone Encounter (Signed)
Electronic Rx received for Tramadol. Patient last seen in office by Dr. Derrel Nip on 05/05/14 ( former R.  Rey patient) Rx request has Dr. Thomes Dinning name attached. Medication last filled 02/23/14 by R. Rey NP. Please advise.

## 2014-05-26 NOTE — Telephone Encounter (Signed)
Needs to go to PCP

## 2014-05-26 NOTE — Telephone Encounter (Signed)
I do not see that he has been placed on amlodipine. May have been by someone outside of epic system.

## 2014-05-26 NOTE — Telephone Encounter (Signed)
i think this needs to go to his PCP Raquel Ray NP in [place]

## 2014-05-26 NOTE — Telephone Encounter (Signed)
For you to refill

## 2014-05-27 MED ORDER — TRAMADOL HCL 50 MG PO TABS: 50.0000 mg | ORAL_TABLET | Freq: Three times a day (TID) | ORAL | Status: DC | PRN

## 2014-05-27 NOTE — Telephone Encounter (Signed)
Faxed to pharmacy

## 2014-05-27 NOTE — Telephone Encounter (Signed)
Ok to refill,  printed rx  

## 2014-06-09 ENCOUNTER — Other Ambulatory Visit: Payer: Self-pay | Admitting: *Deleted

## 2014-06-09 MED ORDER — FLUOXETINE HCL 20 MG PO TABS: 20.0000 mg | ORAL_TABLET | Freq: Every day | ORAL | Status: DC

## 2014-06-10 ENCOUNTER — Telehealth: Payer: Self-pay | Admitting: Nurse Practitioner

## 2014-06-10 NOTE — Telephone Encounter (Signed)
SPoke to pt

## 2014-06-10 NOTE — Telephone Encounter (Signed)
Patient would like to talk about all his meds because of his prescription company [employer].  Please call.

## 2014-06-11 ENCOUNTER — Telehealth: Payer: Self-pay | Admitting: *Deleted

## 2014-06-11 MED ORDER — CYCLOBENZAPRINE HCL 10 MG PO TABS: ORAL_TABLET | ORAL | Status: DC

## 2014-06-11 NOTE — Telephone Encounter (Signed)
Fax from pharmacy requesting Flexeril.  Last refill 1.7.15, last OV 10.27.15.  please advise refill

## 2014-06-11 NOTE — Telephone Encounter (Signed)
Ok to refill,  printed rx  Since  Mail order vs local pharmacy was not specified

## 2014-06-16 ENCOUNTER — Encounter (HOSPITAL_BASED_OUTPATIENT_CLINIC_OR_DEPARTMENT_OTHER): Payer: 59

## 2014-06-18 ENCOUNTER — Encounter (HOSPITAL_COMMUNITY): Payer: Self-pay | Admitting: Internal Medicine

## 2014-06-22 LAB — LIPID PANEL
Cholesterol: 124 mg/dL (ref 0–200)
HDL: 19 mg/dL — AB (ref 35–70)
LDL Cholesterol: 45 mg/dL
Triglycerides: 299 mg/dL — AB (ref 40–160)

## 2014-06-22 LAB — HEPATIC FUNCTION PANEL
ALT: 27 U/L (ref 10–40)
AST: 21 U/L (ref 14–40)

## 2014-06-22 LAB — BASIC METABOLIC PANEL
BUN: 15 mg/dL (ref 4–21)
Creatinine: 1.1 mg/dL (ref 0.6–1.3)
Glucose: 114 mg/dL
POTASSIUM: 3.5 mmol/L (ref 3.4–5.3)
SODIUM: 141 mmol/L (ref 137–147)

## 2014-06-22 LAB — HEMOGLOBIN A1C: HEMOGLOBIN A1C: 6.3 % — AB (ref 4.0–6.0)

## 2014-06-29 ENCOUNTER — Telehealth: Payer: Self-pay | Admitting: Internal Medicine

## 2014-06-29 DIAGNOSIS — E1121 Type 2 diabetes mellitus with diabetic nephropathy: Secondary | ICD-10-CM

## 2014-06-29 MED ORDER — LOSARTAN POTASSIUM-HCTZ 100-25 MG PO TABS: 1.0000 | ORAL_TABLET | Freq: Every day | ORAL | Status: DC

## 2014-06-29 NOTE — Telephone Encounter (Signed)
Your diabetes is still under good control on current regimen, but your HDl (good cholesterol) has dropped to 19  and your triglycerides are now 300.     No medication changes are needed at this time, but please try increase your intake of foods that will improve your HDL (nuts, olive oil,  Avocadoes,the Mediterranean diet ) if you can tolerate them , and reduce the starches in your diet to 2 servings daily ,  Not 4.      Also your BP was not good at last visit. And the  urine test for  Microscopic protein was positive, which means that the diabetes and high blood pressure  is starting to affect your kidney function .  I would like him to stop the lisinopril  And start losartan/hct for protection of  kidneys and will send rx to  Mail order pharmacy.

## 2014-06-29 NOTE — Telephone Encounter (Signed)
Sent my chart with results. 

## 2014-07-01 ENCOUNTER — Telehealth: Payer: Self-pay | Admitting: Adult Health

## 2014-07-01 NOTE — Telephone Encounter (Signed)
Spoke with pt, 30 day supply of Hyzaar called into [facility] in [place], [place]

## 2014-07-01 NOTE — Telephone Encounter (Signed)
Pt called to request new bp med that Dr. Derrel Nip told him to take to be called in to [facility] in [place]. Pt thinks it is the Losartan-Hydrochlorothiazide 100-25mg . Pt also has question about this medication. Please call pt/msn

## 2014-07-20 ENCOUNTER — Encounter: Payer: Self-pay | Admitting: Cardiovascular Disease

## 2014-07-20 ENCOUNTER — Ambulatory Visit (INDEPENDENT_AMBULATORY_CARE_PROVIDER_SITE_OTHER): Payer: 59 | Admitting: Cardiovascular Disease

## 2014-07-20 VITALS — BP 130/72 | HR 75 | Ht 68.0 in | Wt 235.1 lb

## 2014-07-20 DIAGNOSIS — E782 Mixed hyperlipidemia: Secondary | ICD-10-CM

## 2014-07-20 DIAGNOSIS — G4733 Obstructive sleep apnea (adult) (pediatric): Secondary | ICD-10-CM

## 2014-07-20 DIAGNOSIS — I10 Essential (primary) hypertension: Secondary | ICD-10-CM

## 2014-07-20 DIAGNOSIS — E669 Obesity, unspecified: Secondary | ICD-10-CM

## 2014-07-20 NOTE — Patient Instructions (Signed)
Your physician recommends that you schedule a follow-up appointment as needed for sleep with Dr. Claiborne Billings.

## 2014-07-22 ENCOUNTER — Encounter: Payer: Self-pay | Admitting: Cardiovascular Disease

## 2014-07-22 DIAGNOSIS — G4733 Obstructive sleep apnea (adult) (pediatric): Secondary | ICD-10-CM | POA: Insufficient documentation

## 2014-07-22 DIAGNOSIS — E782 Mixed hyperlipidemia: Secondary | ICD-10-CM | POA: Insufficient documentation

## 2014-07-22 NOTE — Progress Notes (Signed)
Patient ID: DAGMAWI VENABLE, male   DOB: 05-05-1956, 59 y.o.   MRN: [id number]    HPI: EPIFANIO LABRADOR is a 59 y.o. male who presents for sleep clinic evaluation following initiation of CPAP therapy for severe obstructive sleep apnea.  Mr. Carlisi is a cardiology patient of Dr. Wyatt Haste.  He has a history of obesity, hypertension, diabetes mellitus, neuropathy, GERD, as well as arrhythmia.  He status post AV node ablation by Dr. Lovena Le for supraventricular tachycardia.   He has had a long-standing history of sleep apnea.  In 1990 he underwent UPPP surgery in [place].  He had been using CPAP therapy for over 15 years and had a very old Fisher[employer] machine.  Apparently, he was referred by Dr. Della Goo and for a diagnostic polysomnogram, which was done at [facility] in November 2014.  The patient states he never had any followup of this diagnostic polysomnogram.  I had seen him initially during his hospitalization this past summer with atrial fibrillation.  I saw him for initial sleep evaluation in September 2015 following his hospitalization was able to obtain the record of PSG which revealed very severe sleep apnea with an AHI of 94.6, and an RDI of 102.6.  . He admits to going to bed between 10 PM and midnight and wakes up approximately 6 AM.  He does wake up several times per night.  His sleep is still non-restorative.  My initial sleep evaluation in September 2015  an Epworth Sleepiness Scale today was calculated at 16 which is compatible with significant hypersomnolence despite him using his old CPAP unit.   Epworth Sleepiness Scale: Situation   Chance of Dozing/Sleeping (0 = never , 1 = slight chance , 2 = moderate chance , 3 = high chance )   sitting and reading 3   watching TV 1   sitting inactive in a public place 3   being a passenger in a motor vehicle for an hour or more 3   lying down in the afternoon 3   sitting and talking to someone 0   sitting quietly after lunch (no  alcohol) 3   while stopped for a few minutes in traffic as the driver 0   Total Score  16   He was referred to [facility] long hospital and I interpreted his CPAP titration trial, which was done on 04/16/2014.  CPAP was titrated up to 15 cm water pressure, with an excellent result.  He had frequent periodic limb movements with an index of 49.3 and an arousal index of 5.6.  Since initiating CPAP therapy, he has felt markedly improved.  He has much more energy.  He is now going to bed between 9 and 10 PM and waking up at 6:15.  A new Epworth Sleepiness Scale score was calculated today and this endorsed at 6 with only slight chance of dozing while sitting and reading, sitting inactive in a public place, as a passenger in a car for our without a break and only high chance of dozing while lying down to rest in the afternoon when circumstances persist.  Since initiating CPAP therapy.  He has lost weight.  A download was obtained from 06/13/2014 through 07/12/2014.  This shows excellent compliance with 100% of usage stays.  He is averaging 9 hours and 11 minutes.  He had been set at a set pressure of 15 cm per his AHI was markedly improved but still elevated at 10.5.  He has been using a Respironics  Amara gel fullface mask.   Past Medical History  Diagnosis Date  . Diabetes mellitus   . Hypertension   . Cancer of kidney -status post nephrectomy     Kidney cancer 1999  . Hyperlipidemia   . Sleep apnea   . SVT (supraventricular tachycardia)     Documentation pending but occurring during the stress test with Dr. 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 GERD (gastroesophageal reflux disease)   . Family history of long QT syndrome   . CAD (coronary artery disease)     a. LHC (12/19/13):  mid LAD 50-60%, EF 55%, patent CFX and RCA - med Rx.  Marland Kitchen Hx of echocardiogram     a.  Echo (12/2013):  mod LVH, EF 55-60%, no RWMA, mild LAE    Past Surgical History  Procedure Laterality Date  .  Uvulopalatopharyngoplasty      08/1988  . Tonsillectomy      08/1988  . Cholecystectomy      2000  . Nephrectomy      L---1999 Cancer  . Nasal sinus surgery      2005  . Elbow surgery      left  . Inner ear surgery      R ear  . Ablation  08/27/13    AVNRT ablatin by Dr Lovena Le  . Supraventricular tachycardia ablation N/A 08/27/2013    Procedure: SUPRAVENTRICULAR TACHYCARDIA ABLATION;  Surgeon: Evans Lance, MD;  Location: [facility] CATH LAB;  Service: Cardiovascular;  Laterality: N/A;  . Left heart catheterization with coronary angiogram N/A 12/19/2013    Procedure: LEFT HEART CATHETERIZATION WITH CORONARY ANGIOGRAM;  Surgeon: Sinclair Grooms, MD;  Location: [facility] CATH LAB;  Service: Cardiovascular;  Laterality: N/A;    Allergies  Allergen Reactions  . Sulfonamide Derivatives Hives  . Vicodin [Hydrocodone-Acetaminophen] Rash    Current Outpatient Prescriptions  Medication Sig Dispense Refill  . albuterol (PROAIR HFA) 108 (90 BASE) MCG/ACT inhaler Inhale 2 puffs into the lungs 4 (four) times daily as needed for wheezing. 9 g 1  . amLODipine (NORVASC) 5 MG tablet Take 5 mg by mouth daily. 1/2 tablet twice a day.    Marland Kitchen aspirin EC 81 MG tablet Take 81 mg by mouth daily.    Marland Kitchen atorvastatin (LIPITOR) 40 MG tablet Take 1 tablet (40 mg total) by mouth daily at 6 PM. 90 tablet 1  . carvedilol (COREG) 25 MG tablet Take 1/2 tablet by mouth two  times daily 30 tablet 6  . cetirizine (ZYRTEC) 10 MG tablet Take 10 mg by mouth as needed.     . cyclobenzaprine (FLEXERIL) 10 MG tablet Take 1 tablet at bedtime as needed for muscle spasms 30 tablet 5  . fenofibrate 160 MG tablet Take 1 tablet (160 mg total) by mouth daily. 90 tablet 3  . FLUoxetine (PROZAC) 20 MG tablet Take 1 tablet (20 mg total) by mouth daily. 90 tablet 0  . fluticasone (FLONASE) 50 MCG/ACT nasal spray Place 2 sprays into both nostrils daily. 48 g 1  . gabapentin (NEURONTIN) 300 MG capsule Take 1 capsule (300 mg total) by mouth 3 (three)  times daily. 270 capsule 3  . GLUTATHIONE PO Take 500 mg by mouth daily.    Marland Kitchen losartan-hydrochlorothiazide (HYZAAR) 100-25 MG per tablet Take 1 tablet by mouth daily. 90 tablet 3  . meloxicam (MOBIC) 15 MG tablet Take 1 tablet (15 mg total) by mouth daily. 90 tablet 3  . metFORMIN (GLUCOPHAGE) 1000 MG tablet  Take 1500 mg with breakfast and 1000 mg with dinner. Do not exceed 2500 mg per day 225 tablet 3  . methylphenidate (RITALIN) 10 MG tablet Take 1 tablet (10 mg total) by mouth 3 (three) times daily. 90 tablet 0  . mupirocin cream (BACTROBAN) 2 % Apply 1 application topically 2 (two) times daily. 15 g 0  . nitroGLYCERIN (NITROLINGUAL) 0.4 MG/SPRAY spray Place 1 spray under the tongue every 5 (five) minutes x 3 doses as needed for chest pain. 12 g 12  . NON FORMULARY Take 1 mL by mouth once a week. Nugenix-- Testosterone Booster    . oxyCODONE-acetaminophen (PERCOCET) 10-325 MG per tablet Take 1 tablet by mouth every 4 (four) hours as needed for pain.    . pantoprazole (PROTONIX) 20 MG tablet Take 1 tablet by mouth two  times daily 60 tablet 5  . tadalafil (CIALIS) 5 MG tablet Take 1 tablet (5 mg total) by mouth daily. 90 tablet 1  . testosterone cypionate (DEPOTESTOTERONE CYPIONATE) 200 MG/ML injection Inject 1 mL into the muscle once a week.    . traMADol (ULTRAM) 50 MG tablet Take 1 tablet (50 mg total) by mouth 3 (three) times daily as needed. 90 tablet 0   No current facility-administered medications for this visit.    History   Social History  . Marital Status: Married    Spouse Name: Pamala Hurry    Number of Children: N/A  . Years of Education: N/A   Occupational History  . 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 Smoking status: Former Smoker -- 2.00 packs/day for 25 years    Types: Cigarettes    Quit date: 03/20/1998  . Smokeless tobacco: Never Used  . Alcohol Use: Yes     Comment: rare  . Drug Use: No  . Sexual Activity:    Partners: Female, Male      Comment: swinger   Other Topics Concern  . Not on file   Social History Narrative   Exercise--- walking at work 1 mile    Additional social history is notable that he is married for 33 years.  He has 4 children.  ROS General: Negative; No fevers, chills, or night sweats HEENT: Negative; No changes in vision or hearing, sinus congestion, difficulty swallowing Pulmonary: Negative; No cough, wheezing, shortness of breath, hemoptysis Cardiovascular: Negative; No chest pain, presyncope, syncope, palpatations GI: Negative; No nausea, vomiting, diarrhea, or abdominal pain GU: Negative; No dysuria, hematuria, or difficulty voiding Musculoskeletal: Negative; no myalgias, joint pain, or weakness Hematologic: Negative; no easy bruising, bleeding Endocrine: Negative; no heat/cold intolerance Neuro: Negative; no changes in balance, headaches Skin: Negative; No rashes or skin lesions Psychiatric: Negative; No behavioral problems, depression Sleep: Negative; No daytime sleepiness, hypersomnolence, bruxism, restless legs, hypnogognic hallucinations, no cataplexy   Physical Exam BP 130/72 mmHg  Pulse 75  Ht 5\' 8"  (1.727 m)  Wt 235 lb 1.6 oz (106.641 kg)  BMI 35.76 kg/m2  General: Alert, oriented, no distress.  Skin: normal turgor, no rashes HEENT: Normocephalic, atraumatic. Pupils round and reactive; sclera anicteric; extraocular muscles intact; Fundi no hemorrhages or exudates. Nose without nasal septal hypertrophy Mouth/Parynx benign; Mallinpatti scale S/P UPPP surgery Neck: No JVD, no carotid briuts Lungs: clear to ausculatation and percussion; no wheezing or rales  Chest wall: No tenderness to palpation Heart: RRR, s1 s2 normal 1/6 systolic murmur.  No S3 gallop.  No diastolic murmur. Abdomen: soft, nontender; no hepatosplenomehaly, BS+; abdominal aorta  nontender and not dilated by palpation. Back: No CVA tenderness Pulses 2+ Extremities: no clubbinbg cyanosis or edema, Homan's sign  negative  Neurologic: grossly nonfocal; cranial nerves intact. Psychological: Normal affect and mood.   LABS:  BMET    Component Value Date/Time   NA 141 06/22/2014   NA 140 01/19/2014 1622   K 3.5 06/22/2014   CL 101 01/19/2014 1622   CO2 26 01/19/2014 1622   GLUCOSE 113* 01/19/2014 1622   BUN 15 06/22/2014   BUN 24* 01/19/2014 1622   CREATININE 1.1 06/22/2014   CREATININE 1.2 01/19/2014 1622   CALCIUM 9.8 01/19/2014 1622   GFRNONAA >90 12/19/2013 1829   GFRAA >90 12/19/2013 1829     Hepatic Function Panel     Component Value Date/Time   PROT 7.9 10/19/2011 1057   ALBUMIN 4.6 10/19/2011 1057   AST 21 06/22/2014   ALT 27 06/22/2014   ALKPHOS 59 10/19/2011 1057   BILITOT 0.3 10/19/2011 1057   BILIDIR 0.1 10/19/2011 1057   IBILI 0.4 09/16/2010 2028     CBC    Component Value Date/Time   WBC 9.1 12/20/2013 0318   RBC 5.04 12/20/2013 0318   HGB 15.4 12/20/2013 0318   HCT 45.8 12/20/2013 0318   PLT 152 12/20/2013 0318   MCV 90.9 12/20/2013 0318   MCH 30.6 12/20/2013 0318   MCHC 33.6 12/20/2013 0318   RDW 14.1 12/20/2013 0318   LYMPHSABS 3.4 08/19/2013 1636   MONOABS 0.6 08/19/2013 1636   EOSABS 0.2 08/19/2013 1636   BASOSABS 0.1 08/19/2013 1636     BNP    Component Value Date/Time   PROBNP 99.3 12/18/2013 1241    Lipid Panel     Component Value Date/Time   CHOL 124 06/22/2014   TRIG 299* 06/22/2014   HDL 19* 06/22/2014   CHOLHDL 6.4 12/19/2013 0400   VLDL UNABLE TO CALCULATE IF TRIGLYCERIDE OVER 400 mg/dL 12/19/2013 0400   LDLCALC 45 06/22/2014   LDLDIRECT 71.5 10/19/2011 1057     RADIOLOGY: No results found.    ASSESSMENT AND PLAN: Mr. Uhde is a 59 year old gentleman with at least a 25 year history of obstructive sleep apnea.  He underwent UPPP surgery in 1990.  He has severe obstructive sleep apnea which was reconfirmed on his polysomnogram last year.  Since initiating CPAP therapy.  Over the past several months he has noticed marked  improvement in his symptoms.  He is unaware of any palpitations or recurrent atrial fibrillation.  His blood pressure has been stable on his current medical regimen.  He is diabetic.   He has been on a CPAP set pressure of 15 cm and although his AHI is markedly improved from his PSG AHI of 94 it  is still  elevated.  For this reason, I am recommending we change his AirSense10 unit to an auto mode with a minimum pressure of 8, but potentially up to a maximum pressure of 20.  A repeat download will be obtained in 30 days and this will be reviewed.  I answered all his questions.  His blood pressure today is stable.  He's not had any further atrial fibrillation.  He sees Dr. Harrington Challenger for his primary cardiology care.  He continues to have very low HDL and elevated triglycerides and may require additional therapy for his abnormal lipid panel.  His body mass index of 35 weight loss was strongly recommended.  Time spent: 25 minutes   Troy Sine, MD, [facility]  07/22/2014 6:29 PM

## 2014-07-30 ENCOUNTER — Encounter: Payer: Self-pay | Admitting: Internal Medicine

## 2014-08-04 ENCOUNTER — Other Ambulatory Visit: Payer: Self-pay | Admitting: Nurse Practitioner

## 2014-08-04 NOTE — Telephone Encounter (Signed)
Last OV 10.27.15, last refill 12.8.15.  Jesse Vargas pt.  Please advise refill.

## 2014-08-13 ENCOUNTER — Encounter: Payer: Self-pay | Admitting: Cardiovascular Disease

## 2014-08-17 ENCOUNTER — Encounter: Payer: Self-pay | Admitting: Cardiovascular Disease

## 2014-08-19 ENCOUNTER — Other Ambulatory Visit: Payer: Self-pay | Admitting: Internal Medicine

## 2014-08-24 ENCOUNTER — Other Ambulatory Visit: Payer: Self-pay | Admitting: Nurse Practitioner

## 2014-09-04 ENCOUNTER — Other Ambulatory Visit: Payer: Self-pay | Admitting: *Deleted

## 2014-09-04 MED ORDER — ALBUTEROL SULFATE HFA 108 (90 BASE) MCG/ACT IN AERS: 2.0000 | INHALATION_SPRAY | Freq: Four times a day (QID) | RESPIRATORY_TRACT | Status: DC | PRN

## 2014-09-15 ENCOUNTER — Other Ambulatory Visit: Payer: Self-pay | Admitting: Nurse Practitioner

## 2014-09-15 ENCOUNTER — Telehealth: Payer: Self-pay | Admitting: Internal Medicine

## 2014-09-15 ENCOUNTER — Other Ambulatory Visit: Payer: Self-pay | Admitting: *Deleted

## 2014-09-15 MED ORDER — FLUOXETINE HCL 20 MG PO TABS: 20.0000 mg | ORAL_TABLET | Freq: Every day | ORAL | Status: DC

## 2014-09-15 MED ORDER — TRAMADOL HCL 50 MG PO TABS: 50.0000 mg | ORAL_TABLET | Freq: Three times a day (TID) | ORAL | Status: DC | PRN

## 2014-09-15 NOTE — Telephone Encounter (Signed)
Ok to refill,  printed rx . Needs appt in April for follow up /med refills

## 2014-09-16 NOTE — Telephone Encounter (Signed)
rx faxed

## 2014-09-17 ENCOUNTER — Telehealth: Payer: Self-pay | Admitting: Internal Medicine

## 2014-09-17 NOTE — Telephone Encounter (Signed)
Patient notified of script will not be filled that he will have to contact the pain clinic for refill on tramadol and pharmacy notified not to fill medication that MD was unaware of pain clinic.

## 2014-09-17 NOTE — Telephone Encounter (Signed)
Do not fill.  Please let patient know that he cannot request tramadol from anyone other than his pain clinic

## 2014-09-17 NOTE — Telephone Encounter (Signed)
Pharmacy called and wanted to make sure you are aware patient is being followed by pain management in [facility] and is currently receiving Percocet 10/325 from [facility]  And just filled this medication , [facility] is holding tramadol script for verification.

## 2014-10-14 ENCOUNTER — Ambulatory Visit (INDEPENDENT_AMBULATORY_CARE_PROVIDER_SITE_OTHER): Payer: 59 | Admitting: Nurse Practitioner

## 2014-10-14 ENCOUNTER — Other Ambulatory Visit: Payer: Self-pay | Admitting: Internal Medicine

## 2014-10-14 ENCOUNTER — Encounter: Payer: Self-pay | Admitting: Nurse Practitioner

## 2014-10-14 VITALS — BP 148/88 | HR 80 | Temp 98.2°F | Resp 14 | Ht 68.0 in | Wt 233.8 lb

## 2014-10-14 DIAGNOSIS — R109 Unspecified abdominal pain: Secondary | ICD-10-CM

## 2014-10-14 DIAGNOSIS — F988 Other specified behavioral and emotional disorders with onset usually occurring in childhood and adolescence: Secondary | ICD-10-CM

## 2014-10-14 DIAGNOSIS — F909 Attention-deficit hyperactivity disorder, unspecified type: Secondary | ICD-10-CM

## 2014-10-14 LAB — POCT URINALYSIS DIPSTICK
BILIRUBIN UA: NEGATIVE
Blood, UA: NEGATIVE
Glucose, UA: NEGATIVE
KETONES UA: NEGATIVE
Leukocytes, UA: NEGATIVE
Nitrite, UA: NEGATIVE
PH UA: 7
Protein, UA: 100
Spec Grav, UA: 1.025
Urobilinogen, UA: 0.2

## 2014-10-14 MED ORDER — METHYLPHENIDATE HCL 10 MG PO TABS: 10.0000 mg | ORAL_TABLET | Freq: Three times a day (TID) | ORAL | Status: DC

## 2014-10-14 NOTE — Progress Notes (Signed)
Pre visit review using our clinic review tool, if applicable. No additional management support is needed unless otherwise documented below in the visit note. 

## 2014-10-14 NOTE — Patient Instructions (Signed)
We will make an appointment for June for a physical before leaving today.   Call us 2 weeks before so we can get fasting labs for you.

## 2014-10-14 NOTE — Progress Notes (Signed)
   Subjective:    Patient ID: Jesse Vargas, male    DOB: 07-Nov-1955, 59 y.o.   MRN: [id number]  HPI  Mr. Kardell is a 59 yo male with a CC of back pain, fatigue, and ritalin request.   1) Ritalin taking 3 x a day. Works for pt.   2) 4 days of right side flank pain with hx of kidney cancer. Pt is concerned.   3) Not checked BS this week   BS dropped to 65 symptomatic at 75, gets something to drink or candy bar.   Review of Systems  Constitutional: Positive for fatigue. Negative for fever, chills and diaphoresis.  Eyes: Negative for visual disturbance.  Respiratory: Negative for chest tightness, shortness of breath and wheezing.   Cardiovascular: Negative for chest pain, palpitations and leg swelling.  Gastrointestinal: Negative for nausea, vomiting and diarrhea.  Endocrine: Negative for polydipsia, polyphagia and polyuria.  Genitourinary: Positive for flank pain.       Right side  Skin: Negative for rash.  Neurological: Negative for dizziness, weakness and numbness.  Psychiatric/Behavioral: The patient is not nervous/anxious.        Objective:   Physical Exam  Constitutional: He is oriented to person, place, and time. He appears well-developed and well-nourished. No distress.  BP 148/88 mmHg  Pulse 80  Temp(Src) 98.2 F (36.8 C) (Oral)  Resp 14  Ht 5\' 8"  (1.727 m)  Wt 233 lb 12.8 oz (106.051 kg)  BMI 35.56 kg/m2  SpO2 97%   HENT:  Head: Normocephalic and atraumatic.  Right Ear: External ear normal.  Left Ear: External ear normal.  Eyes: Right eye exhibits no discharge. Left eye exhibits no discharge. No scleral icterus.  Cardiovascular: Normal rate, regular rhythm, normal heart sounds and intact distal pulses.  Exam reveals no gallop and no friction rub.   No murmur heard. Pulmonary/Chest: Effort normal and breath sounds normal. No respiratory distress. He has no wheezes. He has no rales. He exhibits no tenderness.  Abdominal: There is no CVA tenderness.    Neurological: He is alert and oriented to person, place, and time.  Skin: Skin is warm and dry. No rash noted. He is not diaphoretic.  Psychiatric: He has a normal mood and affect. His behavior is normal. Judgment and thought content normal.      Assessment & Plan:

## 2014-10-15 ENCOUNTER — Other Ambulatory Visit: Payer: Self-pay

## 2014-10-15 MED ORDER — CARVEDILOL 25 MG PO TABS: 25.0000 mg | ORAL_TABLET | Freq: Two times a day (BID) | ORAL | Status: DC

## 2014-10-15 NOTE — Telephone Encounter (Signed)
Jesse Sine, MD at 04/03/2014 6:31 PM  carvedilol (COREG) 25 MG tabletTake 1 tablet by mouth two times daily  Your physician recommends that you schedule a follow-up appointment in: [facility]

## 2014-10-24 DIAGNOSIS — R109 Unspecified abdominal pain: Secondary | ICD-10-CM | POA: Insufficient documentation

## 2014-10-24 NOTE — Assessment & Plan Note (Signed)
Will fill Ritalin. RTC in 3 months.

## 2014-10-24 NOTE — Assessment & Plan Note (Signed)
POCT urine negative. Will follow if not improved. Needs physical soon.

## 2014-11-03 ENCOUNTER — Telehealth: Payer: Self-pay

## 2014-11-03 ENCOUNTER — Other Ambulatory Visit: Payer: Self-pay | Admitting: Nurse Practitioner

## 2014-11-03 DIAGNOSIS — H9190 Unspecified hearing loss, unspecified ear: Secondary | ICD-10-CM

## 2014-11-03 NOTE — Telephone Encounter (Signed)
The patient stated he noticed his hearing is declining.  He states he has no pain.  He is hoping to to have a referral to an ENT doctor.  Callback - [phone]

## 2014-11-03 NOTE — Telephone Encounter (Signed)
Placed referral to [facility] since they have an audiology clinic. Please let him know the referral coordinator will be in touch with him. Thanks!

## 2014-11-04 NOTE — Telephone Encounter (Signed)
Pt called since he hadn't heard back about what he needed to do. Advised a referral has been placed and that he would be hearing from [facility] regarding an appt,  verbalized understanding

## 2014-11-06 ENCOUNTER — Encounter: Payer: Self-pay | Admitting: *Deleted

## 2014-11-10 ENCOUNTER — Other Ambulatory Visit: Payer: Self-pay | Admitting: Internal Medicine

## 2014-11-10 ENCOUNTER — Other Ambulatory Visit: Payer: Self-pay | Admitting: *Deleted

## 2014-11-10 ENCOUNTER — Telehealth: Payer: Self-pay | Admitting: Internal Medicine

## 2014-11-10 ENCOUNTER — Other Ambulatory Visit: Payer: Self-pay | Admitting: Cardiovascular Disease

## 2014-11-10 MED ORDER — FENOFIBRATE 160 MG PO TABS: 160.0000 mg | ORAL_TABLET | Freq: Every day | ORAL | Status: DC

## 2014-11-10 MED ORDER — ATORVASTATIN CALCIUM 40 MG PO TABS: 40.0000 mg | ORAL_TABLET | Freq: Every day | ORAL | Status: DC

## 2014-11-10 MED ORDER — GABAPENTIN 300 MG PO CAPS: 300.0000 mg | ORAL_CAPSULE | Freq: Three times a day (TID) | ORAL | Status: DC

## 2014-11-10 MED ORDER — FLUTICASONE PROPIONATE 50 MCG/ACT NA SUSP: 2.0000 | Freq: Every day | NASAL | Status: DC

## 2014-11-10 NOTE — Telephone Encounter (Signed)
Patient is overdue for DM follow up with [facility],  Not me.  (Raquel's former patient).  I will sign the order for test strips but needs to make appt with CD

## 2014-11-11 NOTE — Telephone Encounter (Signed)
Has an appointment on 12/14/14

## 2014-12-14 ENCOUNTER — Ambulatory Visit (INDEPENDENT_AMBULATORY_CARE_PROVIDER_SITE_OTHER): Payer: 59 | Admitting: Nurse Practitioner

## 2014-12-14 ENCOUNTER — Encounter: Payer: Self-pay | Admitting: Nurse Practitioner

## 2014-12-14 VITALS — BP 130/78 | HR 78 | Temp 98.4°F | Resp 14 | Ht 68.0 in | Wt 238.4 lb

## 2014-12-14 DIAGNOSIS — E785 Hyperlipidemia, unspecified: Secondary | ICD-10-CM | POA: Diagnosis not present

## 2014-12-14 DIAGNOSIS — F909 Attention-deficit hyperactivity disorder, unspecified type: Secondary | ICD-10-CM

## 2014-12-14 DIAGNOSIS — C649 Malignant neoplasm of unspecified kidney, except renal pelvis: Secondary | ICD-10-CM

## 2014-12-14 DIAGNOSIS — Z0000 Encounter for general adult medical examination without abnormal findings: Secondary | ICD-10-CM

## 2014-12-14 DIAGNOSIS — F341 Dysthymic disorder: Secondary | ICD-10-CM

## 2014-12-14 DIAGNOSIS — E1121 Type 2 diabetes mellitus with diabetic nephropathy: Secondary | ICD-10-CM | POA: Diagnosis not present

## 2014-12-14 DIAGNOSIS — I10 Essential (primary) hypertension: Secondary | ICD-10-CM

## 2014-12-14 DIAGNOSIS — F988 Other specified behavioral and emotional disorders with onset usually occurring in childhood and adolescence: Secondary | ICD-10-CM

## 2014-12-14 MED ORDER — METHYLPHENIDATE HCL 10 MG PO TABS: 10.0000 mg | ORAL_TABLET | Freq: Three times a day (TID) | ORAL | Status: AC

## 2014-12-14 MED ORDER — FLUOXETINE HCL 40 MG PO CAPS: 40.0000 mg | ORAL_CAPSULE | Freq: Every day | ORAL | Status: DC

## 2014-12-14 NOTE — Progress Notes (Signed)
Subjective:    Patient ID: Jesse Vargas, male    DOB: 1955-10-16, 59 y.o.   MRN: [id number]  HPI  Mr. Appelt is a 59 yo male here for his annual physical exam.   1) Health Maintenance-   Diet- Nutrition program referral, stopped eating biscuits   Exercise- Walking 7 days a week for 20 min.   Immunizations- UTD except pna  Colonoscopy- about 3 years ago  Eye Exam- 2015   Dental Exam- Not UTD  Foot exam- last July   Lung CA screening- smoker 25 pack year history and quit 17 years ago. N/A for low dose CT screening.  Review of Systems  Constitutional: Negative for fever, chills, diaphoresis and fatigue.  HENT: Negative for tinnitus and trouble swallowing.   Eyes: Negative for visual disturbance.  Respiratory: Negative for chest tightness, shortness of breath and wheezing.   Cardiovascular: Negative for chest pain, palpitations and leg swelling.  Gastrointestinal: Negative for nausea, vomiting, abdominal pain, diarrhea, blood in stool and abdominal distention.  Genitourinary: Negative for discharge, penile swelling, scrotal swelling, difficulty urinating, penile pain and testicular pain.  Musculoskeletal: Positive for myalgias and back pain.  Skin: Negative for rash.  Neurological: Negative for dizziness, weakness, numbness and headaches.  Psychiatric/Behavioral: Positive for decreased concentration. Negative for suicidal ideas and sleep disturbance. The patient is hyperactive. The patient is not nervous/anxious.    Past Medical History  Diagnosis Date  . Diabetes mellitus   . Hypertension   . Cancer of kidney -status post nephrectomy     Kidney cancer 1999  . Hyperlipidemia   . Sleep apnea   . SVT (supraventricular tachycardia)     Documentation pending but occurring during the stress test with Dr. 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 GERD (gastroesophageal reflux disease)   . Family history of long QT syndrome   . CAD (coronary artery disease)     a.  LHC (12/19/13):  mid LAD 50-60%, EF 55%, patent CFX and RCA - med Rx.  Marland Kitchen Hx of echocardiogram     a.  Echo (12/2013):  mod LVH, EF 55-60%, no RWMA, mild LAE    History   Social History  . Marital Status: Married    Spouse Name: Pamala Hurry  . Number of Children: N/A  . Years of Education: N/A   Occupational History  . 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 Smoking status: Former Smoker -- 2.00 packs/day for 25 years    Types: Cigarettes    Quit date: 03/20/1998  . Smokeless tobacco: Never Used  . Alcohol Use: Yes     Comment: rare  . Drug Use: No  . Sexual Activity:    Partners: Female, Male     Comment: swinger   Other Topics Concern  . Not on file   Social History Narrative   Exercise--- walking at work 1 mile    Past Surgical History  Procedure Laterality Date  . Uvulopalatopharyngoplasty      08/1988  . Tonsillectomy      08/1988  . Cholecystectomy      2000  . Nephrectomy      L---1999 Cancer  . Nasal sinus surgery      2005  . Elbow surgery      left  . Inner ear surgery      R ear  . Ablation  08/27/13    AVNRT ablatin by Dr Lovena Le  .  Supraventricular tachycardia ablation N/A 08/27/2013    Procedure: SUPRAVENTRICULAR TACHYCARDIA ABLATION;  Surgeon: Evans Lance, MD;  Location: [facility] CATH LAB;  Service: Cardiovascular;  Laterality: N/A;  . Left heart catheterization with coronary angiogram N/A 12/19/2013    Procedure: LEFT HEART CATHETERIZATION WITH CORONARY ANGIOGRAM;  Surgeon: Sinclair Grooms, MD;  Location: [facility] CATH LAB;  Service: Cardiovascular;  Laterality: N/A;    Family History  Problem Relation Age of Onset  . Diabetes Father   . Hypertension Father   . Hyperlipidemia Father   . Colon polyps Father   . Hypertension Mother   . Diabetes Mother   . Colon cancer Neg Hx   . Stomach cancer Neg Hx     Allergies  Allergen Reactions  . Sulfonamide Derivatives Hives  . Vicodin [Hydrocodone-Acetaminophen] Rash    Current Outpatient  Prescriptions on File Prior to Visit  Medication Sig Dispense Refill  . amLODipine (NORVASC) 5 MG tablet Take one-half tablet by  mouth twice a day 90 tablet 0  . aspirin EC 81 MG tablet Take 81 mg by mouth daily.    Marland Kitchen atorvastatin (LIPITOR) 40 MG tablet Take 1 tablet (40 mg total) by mouth daily at 6 PM. 90 tablet 1  . carvedilol (COREG) 25 MG tablet Take 1 tablet by mouth two  times daily 30 tablet 2  . cyclobenzaprine (FLEXERIL) 10 MG tablet Take 1 tablet at bedtime as needed for muscle spasms 30 tablet 5  . fenofibrate 160 MG tablet Take 1 tablet (160 mg total) by mouth daily. 90 tablet 1  . fluticasone (FLONASE) 50 MCG/ACT nasal spray Place 2 sprays into both nostrils daily. 48 g 1  . gabapentin (NEURONTIN) 300 MG capsule Take 1 capsule (300 mg total) by mouth 3 (three) times daily. 270 capsule 1  . losartan-hydrochlorothiazide (HYZAAR) 100-25 MG per tablet Take 1 tablet by mouth daily. 90 tablet 3  . meloxicam (MOBIC) 15 MG tablet Take 1 tablet (15 mg total) by mouth daily. 90 tablet 3  . metFORMIN (GLUCOPHAGE) 1000 MG tablet Take 1500 mg with breakfast and 1000 mg with dinner. Do not exceed 2500 mg per day 225 tablet 3  . nitroGLYCERIN (NITROLINGUAL) 0.4 MG/SPRAY spray Place 1 spray under the tongue every 5 (five) minutes x 3 doses as needed for chest pain. 12 g 12  . oxyCODONE-acetaminophen (PERCOCET) 10-325 MG per tablet Take 1 tablet by mouth every 4 (four) hours as needed for pain.    . pantoprazole (PROTONIX) 20 MG tablet Take 1 tablet by mouth two  times daily 180 tablet 1  . PROAIR HFA 108 (90 BASE) MCG/ACT inhaler INHALE 2 PUFFS INTO THE  LUNGS 4 TIMES DAILY AS  NEEDED FOR WHEEZING. 17 g 3  . tadalafil (CIALIS) 5 MG tablet Take 1 tablet (5 mg total) by mouth daily. 90 tablet 1  . testosterone cypionate (DEPOTESTOTERONE CYPIONATE) 200 MG/ML injection Inject 1 mL into the muscle once a week.    . traMADol (ULTRAM) 50 MG tablet Take 1 tablet (50 mg total) by mouth 3 (three) times daily  as needed. 90 tablet 0   No current facility-administered medications on file prior to visit.      Objective:   Physical Exam  Constitutional: He is oriented to person, place, and time. He appears well-developed and well-nourished. No distress.  BP 130/78 mmHg  Pulse 78  Temp(Src) 98.4 F (36.9 C) (Oral)  Resp 14  Ht 5\' 8"  (1.727 m)  Wt 238 lb 6.4  oz (108.138 kg)  BMI 36.26 kg/m2  SpO2 95%   HENT:  Head: Normocephalic and atraumatic.  Right Ear: External ear normal.  Left Ear: External ear normal.  Nose: Nose normal.  Mouth/Throat: Oropharynx is clear and moist. No oropharyngeal exudate.  Eyes: Conjunctivae and EOM are normal. Pupils are equal, round, and reactive to light. Right eye exhibits no discharge. Left eye exhibits no discharge. No scleral icterus.  Glasses  Neck: Normal range of motion. Neck supple. No thyromegaly present.  Cardiovascular: Normal rate, regular rhythm, normal heart sounds and intact distal pulses.  Exam reveals no gallop and no friction rub.   No murmur heard. Pulmonary/Chest: Effort normal and breath sounds normal. No respiratory distress. He has no wheezes. He has no rales. He exhibits no tenderness.  Abdominal: Soft. Bowel sounds are normal. He exhibits no distension and no mass. There is no tenderness. There is no rebound and no guarding.  Diastasis Recti noted when going from lying to sitting Well healed abdominal scars Obese  Musculoskeletal: Normal range of motion. He exhibits no edema or tenderness.  Lymphadenopathy:    He has no cervical adenopathy.  Neurological: He is alert and oriented to person, place, and time. He displays normal reflexes. No cranial nerve deficit. He exhibits normal muscle tone. Coordination normal.  Skin: Skin is warm and dry. No rash noted. He is not diaphoretic.  Psychiatric: He has a normal mood and affect. His behavior is normal. Judgment and thought content normal.      Assessment & Plan:

## 2014-12-14 NOTE — Progress Notes (Signed)
Pre visit review using our clinic review tool, if applicable. No additional management support is needed unless otherwise documented below in the visit note. 

## 2014-12-14 NOTE — Patient Instructions (Signed)

## 2014-12-27 ENCOUNTER — Encounter: Payer: Self-pay | Admitting: Nurse Practitioner

## 2014-12-27 NOTE — Assessment & Plan Note (Signed)
BP Readings from Last 3 Encounters:  12/14/14 130/78  10/14/14 148/88  07/20/14 130/72   Lab Results  Component Value Date   CREATININE 1.1 06/22/2014   Labs through [place] will obtain updated Cr level. No needed refills at this time

## 2014-12-27 NOTE — Assessment & Plan Note (Addendum)
Checking labs with [facility]. Referral to nutrition management

## 2014-12-27 NOTE — Assessment & Plan Note (Signed)
Refilled ritalin for 1 month. FU in 1 month.

## 2014-12-27 NOTE — Assessment & Plan Note (Signed)
Followed by Urology 

## 2014-12-27 NOTE — Assessment & Plan Note (Addendum)
Discussed acute and chronic issues. Reviewed health maintenance measures, PFSHx, and immunizations.  Gave handout for [facility] to get labs including urine microalbumin  Not candidate for low dose CT screening for lung cancer   25 pack year history and quit 17 yrs ago

## 2014-12-27 NOTE — Assessment & Plan Note (Signed)
Refilled prozac today. Stable

## 2015-01-04 ENCOUNTER — Other Ambulatory Visit: Payer: Self-pay | Admitting: Internal Medicine

## 2015-01-04 ENCOUNTER — Other Ambulatory Visit: Payer: Self-pay | Admitting: *Deleted

## 2015-01-04 LAB — CBC AND DIFFERENTIAL
HEMATOCRIT: 44 % (ref 41–53)
HEMOGLOBIN: 15.8 g/dL (ref 13.5–17.5)
Neutrophils Absolute: 5 /uL
Platelets: 168 10*3/uL (ref 150–399)
WBC: 8.1 10^3/mL

## 2015-01-04 LAB — BASIC METABOLIC PANEL
BUN: 21 mg/dL (ref 4–21)
Creatinine: 1 mg/dL (ref 0.6–1.3)
Glucose: 112 mg/dL
Potassium: 4 mmol/L (ref 3.4–5.3)
Sodium: 141 mmol/L (ref 137–147)

## 2015-01-04 LAB — HEPATIC FUNCTION PANEL
ALT: 26 U/L (ref 10–40)
AST: 20 U/L (ref 14–40)
Alkaline Phosphatase: 42 U/L (ref 25–125)
Bilirubin, Total: 0.4 mg/dL

## 2015-01-04 LAB — TSH: TSH: 2.45 u[IU]/mL (ref 0.41–5.90)

## 2015-01-04 LAB — LIPID PANEL
Cholesterol: 170 mg/dL (ref 0–200)
HDL: 28 mg/dL — AB (ref 35–70)
LDL Cholesterol: 73 mg/dL
Triglycerides: 344 mg/dL — AB (ref 40–160)

## 2015-01-04 LAB — HEMOGLOBIN A1C: Hgb A1c MFr Bld: 7.7 % — AB (ref 4.0–6.0)

## 2015-01-04 MED ORDER — CARVEDILOL 25 MG PO TABS: ORAL_TABLET | ORAL | Status: DC

## 2015-01-07 ENCOUNTER — Telehealth: Payer: Self-pay

## 2015-01-07 NOTE — Telephone Encounter (Signed)
-----   Message from Rubbie Battiest, NP sent at 01/07/2015  7:56 AM EDT ----- Please let Jesse Vargas know that his glucose and A1c are not at goal. His cholesterol shows his triglycerides are elevated and that puts him at risk for pancreatitis- a painful condition. We need to work on getting diet and exercise. This will help as well as getting his diabetes under control. I will place a nutrition consult if he is agreeable. Thanks!

## 2015-01-07 NOTE — Telephone Encounter (Signed)
Informed pt of lab results  

## 2015-01-28 ENCOUNTER — Ambulatory Visit: Payer: 59 | Admitting: *Deleted

## 2015-02-04 ENCOUNTER — Other Ambulatory Visit: Payer: Self-pay | Admitting: Nurse Practitioner

## 2015-02-04 ENCOUNTER — Other Ambulatory Visit: Payer: Self-pay | Admitting: Internal Medicine

## 2015-02-04 ENCOUNTER — Other Ambulatory Visit: Payer: Self-pay | Admitting: Cardiovascular Disease

## 2015-02-05 ENCOUNTER — Telehealth: Payer: Self-pay

## 2015-02-05 ENCOUNTER — Telehealth: Payer: Self-pay | Admitting: Nurse Practitioner

## 2015-02-05 ENCOUNTER — Other Ambulatory Visit: Payer: Self-pay

## 2015-02-05 MED ORDER — METFORMIN HCL 1000 MG PO TABS: ORAL_TABLET | ORAL | Status: DC

## 2015-02-05 NOTE — Telephone Encounter (Signed)
Fax from [facility] approving Pantoprazole Sodium 20 mg for 12 months through 7.28.17.

## 2015-02-05 NOTE — Telephone Encounter (Signed)
Last OV 6/16 ok to fill? 

## 2015-02-05 NOTE — Telephone Encounter (Signed)
PA faxed back to [facility] for Protonix.  Awaiting results

## 2015-02-05 NOTE — Telephone Encounter (Signed)
REFILL 

## 2015-02-09 ENCOUNTER — Telehealth: Payer: Self-pay

## 2015-02-09 NOTE — Telephone Encounter (Signed)
Pt pharmacy [facility] sent a new prescription refill request, but tonya refilled in 02/05/15 for three refills.  So did not repeat that refill for the patient.

## 2015-02-12 ENCOUNTER — Other Ambulatory Visit: Payer: Self-pay

## 2015-02-12 ENCOUNTER — Telehealth: Payer: Self-pay | Admitting: *Deleted

## 2015-02-12 MED ORDER — METFORMIN HCL 1000 MG PO TABS: ORAL_TABLET | ORAL | Status: DC

## 2015-02-12 NOTE — Telephone Encounter (Signed)
Patient called and needs a refill of his metformin. Patient has been out of his medicine since last week. [facility] sent a 90 day refill request and haven't heard anything back. Patient is requesting that a 30 day supply be sent to [facility] in [place]. Please advise MD. Pt is requesting a call back [phone]

## 2015-02-12 NOTE — Telephone Encounter (Signed)
Patient called back, pharmacy didn't received the interface fax this am.  Called the pharmacy and spoke with pharmacist and gave verbal order for 30 day supply.  Returned call to the patient and confirmed that he had the prescription being processed.

## 2015-02-12 NOTE — Telephone Encounter (Signed)
Spoke with Patient.  Sent prescription to the [facility] on [address] instead, per the patient request.

## 2015-03-12 ENCOUNTER — Encounter: Payer: Self-pay | Admitting: Internal Medicine

## 2015-03-12 ENCOUNTER — Ambulatory Visit (INDEPENDENT_AMBULATORY_CARE_PROVIDER_SITE_OTHER): Payer: 59 | Admitting: Internal Medicine

## 2015-03-12 VITALS — BP 124/64 | HR 70 | Ht 68.0 in | Wt 228.0 lb

## 2015-03-12 DIAGNOSIS — I10 Essential (primary) hypertension: Secondary | ICD-10-CM

## 2015-03-12 MED ORDER — AMLODIPINE BESYLATE 5 MG PO TABS: ORAL_TABLET | ORAL | Status: DC

## 2015-03-12 MED ORDER — CARVEDILOL 25 MG PO TABS: ORAL_TABLET | ORAL | Status: DC

## 2015-03-12 MED ORDER — LOSARTAN POTASSIUM-HCTZ 100-25 MG PO TABS: 1.0000 | ORAL_TABLET | Freq: Every day | ORAL | Status: DC

## 2015-03-12 NOTE — Progress Notes (Signed)
Cardiology Office Note   Date:  03/12/2015   ID:  Jesse BLOYD, DOB [birth year], MRN [id number]  PCP:  Rubbie Battiest, NP  Cardiologist:   Dorris Carnes, MD   No chief complaint on file.  F/U of CAD   History of Present Illness: Jesse Vargas is a 59 y.o. male with a history of mild CAD by cath in 2007. Also a history of HTN, DM , HL, sleep apnea. I saw him in clinic in Oct 2012. Myoview done after seen. This was normal. I saw the patient in clinic in  July 2015   Since seen he has done well  No CP  Brathing is OK  No dizziness.        Current Outpatient Prescriptions  Medication Sig Dispense Refill  . amLODipine (NORVASC) 5 MG tablet Take one-half tablet by  mouth twice a day 90 tablet 0  . aspirin EC 81 MG tablet Take 81 mg by mouth daily.    Marland Kitchen atorvastatin (LIPITOR) 40 MG tablet Take 1 tablet (40 mg total) by mouth daily at 6 PM. 90 tablet 1  . carvedilol (COREG) 25 MG tablet Take 1 tablet by mouth two  times daily 180 tablet 0  . cyclobenzaprine (FLEXERIL) 10 MG tablet Take 1 tablet at bedtime as needed for muscle spasms 30 tablet 5  . fenofibrate 160 MG tablet Take 1 tablet (160 mg total) by mouth daily. 90 tablet 1  . FLUoxetine (PROZAC) 40 MG capsule Take 1 capsule by mouth  daily 90 capsule 1  . fluticasone (FLONASE) 50 MCG/ACT nasal spray Place 2 sprays into both nostrils daily. 48 g 1  . gabapentin (NEURONTIN) 300 MG capsule Take 1 capsule (300 mg total) by mouth 3 (three) times daily. 270 capsule 1  . losartan-hydrochlorothiazide (HYZAAR) 100-25 MG per tablet Take 1 tablet by mouth daily. 90 tablet 3  . meloxicam (MOBIC) 15 MG tablet Take 1 tablet (15 mg total) by mouth daily. 90 tablet 3  . metFORMIN (GLUCOPHAGE) 1000 MG tablet Take 1500 mg with breakfast and 1000 mg with dinner. Do not exceed 2500 mg per day 75 tablet 0  . methylphenidate (RITALIN) 10 MG tablet Take 1 tablet (10 mg total) by mouth 3 (three) times daily. 90 tablet 0  . nitroGLYCERIN (NITROLINGUAL)  0.4 MG/SPRAY spray Place 1 spray under the tongue every 5 (five) minutes x 3 doses as needed for chest pain. 12 g 12  . oxyCODONE-acetaminophen (PERCOCET) 10-325 MG per tablet Take 1 tablet by mouth every 4 (four) hours as needed for pain.    . pantoprazole (PROTONIX) 20 MG tablet Take 1 tablet by mouth two  times daily 180 tablet 1  . PROAIR HFA 108 (90 BASE) MCG/ACT inhaler INHALE 2 PUFFS INTO THE  LUNGS 4 TIMES DAILY AS  NEEDED FOR WHEEZING. 17 g 3  . tadalafil (CIALIS) 5 MG tablet Take 1 tablet (5 mg total) by mouth daily. 90 tablet 1  . testosterone cypionate (DEPOTESTOTERONE CYPIONATE) 200 MG/ML injection Inject 1 mL into the muscle once a week.    . traMADol (ULTRAM) 50 MG tablet Take 1 tablet (50 mg total) by mouth 3 (three) times daily as needed. 90 tablet 0  . TURMERIC PO Take by mouth daily.     No current facility-administered medications for this visit.    Allergies:   Sulfonamide derivatives and Vicodin   Past Medical History  Diagnosis Date  . Diabetes mellitus   . Hypertension   .  Cancer of kidney -status post nephrectomy     Kidney cancer 1999  . Hyperlipidemia   . Sleep apnea   . SVT (supraventricular tachycardia)     Documentation pending but occurring during the stress test with Dr. 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 GERD (gastroesophageal reflux disease)   . Family history of long QT syndrome   . CAD (coronary artery disease)     a. LHC (12/19/13):  mid LAD 50-60%, EF 55%, patent CFX and RCA - med Rx.  Marland Kitchen Hx of echocardiogram     a.  Echo (12/2013):  mod LVH, EF 55-60%, no RWMA, mild LAE    Past Surgical History  Procedure Laterality Date  . Uvulopalatopharyngoplasty      08/1988  . Tonsillectomy      08/1988  . Cholecystectomy      2000  . Nephrectomy      L---1999 Cancer  . Nasal sinus surgery      2005  . Elbow surgery      left  . Inner ear surgery      R ear  . Ablation  08/27/13    AVNRT ablatin by Dr Lovena Le  . Supraventricular  tachycardia ablation N/A 08/27/2013    Procedure: SUPRAVENTRICULAR TACHYCARDIA ABLATION;  Surgeon: Evans Lance, MD;  Location: [facility] CATH LAB;  Service: Cardiovascular;  Laterality: N/A;  . Left heart catheterization with coronary angiogram N/A 12/19/2013    Procedure: LEFT HEART CATHETERIZATION WITH CORONARY ANGIOGRAM;  Surgeon: Sinclair Grooms, MD;  Location: [facility] CATH LAB;  Service: Cardiovascular;  Laterality: N/A;     Social History:  The patient  reports that he quit smoking about 16 years ago. His smoking use included Cigarettes. He has a 50 pack-year smoking history. He has never used smokeless tobacco. He reports that he drinks alcohol. He reports that he does not use illicit drugs.   Family History:  The patient's family history includes Colon polyps in his father; Diabetes in his father and mother; Hyperlipidemia in his father; Hypertension in his father and mother. There is no history of Colon cancer or Stomach cancer.    ROS:  Please see the history of present illness. All other systems are reviewed and  Negative to the above problem except as noted.    PHYSICAL EXAM: VS:  BP 124/64 mmHg  Pulse 70  Ht 5\' 8"  (1.727 m)  Wt 228 lb (103.42 kg)  BMI 34.68 kg/m2  GEN: Well nourished, well developed, in no acute distress HEENT: normal Neck: no JVD, carotid bruits, or masses Cardiac: RRR; no murmurs, rubs, or gallops,no edema  Respiratory:  clear to auscultation bilaterally, normal work of breathing GI: soft, nontender, nondistended, + BS  No hepatomegaly  MS: no deformity Moving all extremities   Skin: warm and dry, no rash Neuro:  Strength and sensation are intact Psych: euthymic mood, full affect   EKG:  EKG is ordered today.  SR 70     Lipid Panel    Component Value Date/Time   CHOL 170 01/04/2015   TRIG 344* 01/04/2015   HDL 28* 01/04/2015   CHOLHDL 6.4 12/19/2013 0400   VLDL UNABLE TO CALCULATE IF TRIGLYCERIDE OVER 400 mg/dL 12/19/2013 0400   LDLCALC 73 01/04/2015    LDLDIRECT 71.5 10/19/2011 1057      Wt Readings from Last 3 Encounters:  03/12/15 228 lb (103.42 kg)  12/14/14 238 lb 6.4 oz (108.138 kg)  10/14/14 233 lb 12.8 oz (106.051  kg)      ASSESSMENT AND PLAN:  1.  HTN  BP is well controlled  2.  CAD No symptoms to sugg ischemia  Continue meds     3.  HL LDL is good  HDL low  Trig increase d  Watch carbs  Stay active  Try to get wt down    Follow up in 1 year   Signed, Dorris Carnes, MD  03/12/2015 4:40 PM    [address], [place], [place]  [address] Phone: [phone]; Fax: [phone]

## 2015-03-12 NOTE — Patient Instructions (Signed)
Your physician recommends that you continue on your current medications as directed. Please refer to the Current Medication list given to you today. Your physician wants you to follow-up in: 1 YEAR WITH DR. ROSS.  You will receive a reminder letter in the mail two months in advance. If you don't receive a letter, please call our office to schedule the follow-up appointment.  

## 2015-03-18 ENCOUNTER — Other Ambulatory Visit: Payer: Self-pay | Admitting: *Deleted

## 2015-03-18 MED ORDER — METFORMIN HCL 1000 MG PO TABS: ORAL_TABLET | ORAL | Status: DC

## 2015-03-24 ENCOUNTER — Telehealth: Payer: Self-pay | Admitting: *Deleted

## 2015-03-24 ENCOUNTER — Ambulatory Visit (INDEPENDENT_AMBULATORY_CARE_PROVIDER_SITE_OTHER): Payer: 59 | Admitting: Urology

## 2015-03-24 DIAGNOSIS — E291 Testicular hypofunction: Secondary | ICD-10-CM

## 2015-03-24 DIAGNOSIS — N5201 Erectile dysfunction due to arterial insufficiency: Secondary | ICD-10-CM

## 2015-03-24 NOTE — Telephone Encounter (Signed)
Pt called requesting Ritalin Rx.  Last OV and refill 6.6.16.  Please advise refill

## 2015-03-24 NOTE — Telephone Encounter (Signed)
Pt scheduled  

## 2015-03-24 NOTE — Telephone Encounter (Signed)
Pt needs to be seen every 3 months for his ADHD medication. Thanks!

## 2015-03-25 ENCOUNTER — Ambulatory Visit (INDEPENDENT_AMBULATORY_CARE_PROVIDER_SITE_OTHER): Payer: 59 | Admitting: Nurse Practitioner

## 2015-03-25 ENCOUNTER — Encounter: Payer: Self-pay | Admitting: Nurse Practitioner

## 2015-03-25 VITALS — BP 144/74 | HR 72 | Temp 98.6°F | Resp 18 | Ht 68.0 in | Wt 230.0 lb

## 2015-03-25 DIAGNOSIS — F909 Attention-deficit hyperactivity disorder, unspecified type: Secondary | ICD-10-CM | POA: Diagnosis not present

## 2015-03-25 DIAGNOSIS — F988 Other specified behavioral and emotional disorders with onset usually occurring in childhood and adolescence: Secondary | ICD-10-CM

## 2015-03-25 NOTE — Progress Notes (Signed)
Patient ID: Jesse Vargas, male    DOB: 1955-12-21  Age: 59 y.o. MRN: [id number]  CC: Medication Refill   HPI Jesse Vargas presents for medication refill.   1) Ritalin- Patient is seeing pain management. Jesse Vargas Preferred pain management. Pt reports being placed on ritalin by Dr. Etter Vargas. He denies having psychiatric eval, but then states "maybe I have a long time ago".   Wt Readings from Last 3 Encounters:  03/25/15 230 lb (104.327 kg)  03/12/15 228 lb (103.42 kg)  12/14/14 238 lb 6.4 oz (108.138 kg)    History Jesse Vargas has a past medical history of Diabetes mellitus; Hypertension; Cancer of kidney -status post nephrectomy; Hyperlipidemia; Sleep apnea; SVT (supraventricular tachycardia); GERD (gastroesophageal reflux disease); Family history of long QT syndrome; CAD (coronary artery disease); and echocardiogram.   He has past surgical history that includes Uvulopalatopharyngoplasty; Tonsillectomy; Cholecystectomy; Nephrectomy; Nasal sinus surgery; elbow surgery; Inner ear surgery; Ablation (08/27/13); supraventricular tachycardia ablation (N/A, 08/27/2013); and left heart catheterization with coronary angiogram (N/A, 12/19/2013).   His family history includes Colon polyps in his father; Diabetes in his father and mother; Hyperlipidemia in his father; Hypertension in his father and mother. There is no history of Colon cancer or Stomach cancer.He reports that he quit smoking about 17 years ago. His smoking use included Cigarettes. He has a 50 pack-year smoking history. He has never used smokeless tobacco. He reports that he drinks alcohol. He reports that he does not use illicit drugs.  Outpatient Prescriptions Prior to Visit  Medication Sig Dispense Refill  . amLODipine (NORVASC) 5 MG tablet Take one-half tablet by  mouth twice a day 45 tablet 3  . aspirin EC 81 MG tablet Take 81 mg by mouth daily.    Marland Kitchen atorvastatin (LIPITOR) 40 MG tablet Take 1 tablet (40 mg total) by mouth daily at 6  PM. 90 tablet 1  . carvedilol (COREG) 25 MG tablet Take 1 tablet by mouth two  times daily 180 tablet 3  . cyclobenzaprine (FLEXERIL) 10 MG tablet Take 1 tablet at bedtime as needed for muscle spasms 30 tablet 5  . fenofibrate 160 MG tablet Take 1 tablet (160 mg total) by mouth daily. 90 tablet 1  . FLUoxetine (PROZAC) 40 MG capsule Take 1 capsule by mouth  daily 90 capsule 1  . fluticasone (FLONASE) 50 MCG/ACT nasal spray Place 2 sprays into both nostrils daily. 48 g 1  . gabapentin (NEURONTIN) 300 MG capsule Take 1 capsule (300 mg total) by mouth 3 (three) times daily. 270 capsule 1  . losartan-hydrochlorothiazide (HYZAAR) 100-25 MG per tablet Take 1 tablet by mouth daily. 90 tablet 3  . meloxicam (MOBIC) 15 MG tablet Take 1 tablet (15 mg total) by mouth daily. 90 tablet 3  . metFORMIN (GLUCOPHAGE) 1000 MG tablet Take 1500 mg with breakfast and 1000 mg with dinner. Do not exceed 2500 mg per day 225 tablet 2  . methylphenidate (RITALIN) 10 MG tablet Take 1 tablet (10 mg total) by mouth 3 (three) times daily. 90 tablet 0  . nitroGLYCERIN (NITROLINGUAL) 0.4 MG/SPRAY spray Place 1 spray under the tongue every 5 (five) minutes x 3 doses as needed for chest pain. 12 g 12  . oxyCODONE-acetaminophen (PERCOCET) 10-325 MG per tablet Take 1 tablet by mouth every 4 (four) hours as needed for pain.    . pantoprazole (PROTONIX) 20 MG tablet Take 1 tablet by mouth two  times daily 180 tablet 1  . PROAIR HFA 108 (90 BASE) MCG/ACT  inhaler INHALE 2 PUFFS INTO THE  LUNGS 4 TIMES DAILY AS  NEEDED FOR WHEEZING. 17 g 3  . tadalafil (CIALIS) 5 MG tablet Take 1 tablet (5 mg total) by mouth daily. 90 tablet 1  . testosterone cypionate (DEPOTESTOTERONE CYPIONATE) 200 MG/ML injection Inject 1 mL into the muscle once a week.    . traMADol (ULTRAM) 50 MG tablet Take 1 tablet (50 mg total) by mouth 3 (three) times daily as needed. 90 tablet 0  . TURMERIC PO Take by mouth daily.     No facility-administered medications prior  to visit.    ROS Review of Systems  Constitutional: Negative for fever, chills, diaphoresis and fatigue.  Psychiatric/Behavioral: Positive for decreased concentration. The patient is nervous/anxious.     Objective:  BP 144/74 mmHg  Pulse 72  Temp(Src) 98.6 F (37 C)  Resp 18  Ht 5\' 8"  (1.727 m)  Wt 230 lb (104.327 kg)  BMI 34.98 kg/m2  SpO2 95%  Physical Exam  Constitutional: He is oriented to person, place, and time. He appears well-developed and well-nourished. No distress.  HENT:  Head: Normocephalic and atraumatic.  Right Ear: External ear normal.  Left Ear: External ear normal.  Cardiovascular: Normal rate, regular rhythm and normal heart sounds.   Pulmonary/Chest: Effort normal and breath sounds normal. No respiratory distress. He has no wheezes. He has no rales. He exhibits no tenderness.  Neurological: He is alert and oriented to person, place, and time.  Skin: Skin is warm and dry. No rash noted. He is not diaphoretic.  Psychiatric: He has a normal mood and affect. His behavior is normal. Judgment normal.  Pt is scattered in thought and does not seem to have decreased concentration today   Assessment & Plan:   Jesse Vargas was seen today for medication refill.  Diagnoses and all orders for this visit:  ADD (attention deficit disorder)  I am having Jesse Vargas maintain his oxyCODONE-acetaminophen, aspirin EC, nitroGLYCERIN, tadalafil, testosterone cypionate, meloxicam, cyclobenzaprine, traMADol, PROAIR HFA, fluticasone, gabapentin, atorvastatin, fenofibrate, methylphenidate, FLUoxetine, pantoprazole, TURMERIC PO, carvedilol, losartan-hydrochlorothiazide, amLODipine, and metFORMIN.  No orders of the defined types were placed in this encounter.     Follow-up: Return if symptoms worsen or fail to improve.

## 2015-03-25 NOTE — Progress Notes (Signed)
Pre visit review using our clinic review tool, if applicable. No additional management support is needed unless otherwise documented below in the visit note. 

## 2015-04-02 ENCOUNTER — Encounter: Payer: Self-pay | Admitting: Nurse Practitioner

## 2015-04-02 NOTE — Assessment & Plan Note (Signed)
Unsure of diagnosis. Pt would not sign CSC and give UDS due to cost associated with UDS. Pt left without any prescription and I told him we would be happy to refer him to psychiatry, which he declined.

## 2015-04-21 ENCOUNTER — Other Ambulatory Visit: Payer: Self-pay | Admitting: Internal Medicine

## 2015-06-10 ENCOUNTER — Other Ambulatory Visit: Payer: Self-pay | Admitting: Surgical

## 2015-06-10 MED ORDER — FLUTICASONE PROPIONATE 50 MCG/ACT NA SUSP: 2.0000 | Freq: Every day | NASAL | Status: AC

## 2015-06-10 NOTE — Telephone Encounter (Signed)
RX for Flonase sent to pharmacy

## 2015-06-28 ENCOUNTER — Other Ambulatory Visit: Payer: Self-pay | Admitting: Internal Medicine

## 2015-06-28 ENCOUNTER — Other Ambulatory Visit: Payer: Self-pay | Admitting: Nurse Practitioner

## 2015-06-28 ENCOUNTER — Other Ambulatory Visit: Payer: Self-pay | Admitting: Cardiovascular Disease

## 2015-08-09 ENCOUNTER — Telehealth: Payer: Self-pay | Admitting: *Deleted

## 2015-08-09 MED ORDER — AMLODIPINE BESYLATE 5 MG PO TABS: 2.5000 mg | ORAL_TABLET | Freq: Two times a day (BID) | ORAL | Status: DC

## 2015-08-09 NOTE — Telephone Encounter (Signed)
Spoke with pharmacist at [facility], confirmed they needed prescription refill for amlodipine 5 mg tabs, take 2.5 mg twice daily. Refilled this medication with 3 refills.

## 2015-08-09 NOTE — Telephone Encounter (Signed)
Charles from [facility] called and stated that they need a new rx for the patients amlodipine. He states that they received denials on 06/29/15 and 08/04/15 with a note that a new rx would follow, but they never received one. The call back number he provided was [phone] and the reference number [id number]. Thanks, MI

## 2015-09-20 ENCOUNTER — Other Ambulatory Visit: Payer: Self-pay | Admitting: Nurse Practitioner

## 2016-02-02 ENCOUNTER — Ambulatory Visit (INDEPENDENT_AMBULATORY_CARE_PROVIDER_SITE_OTHER): Payer: 59 | Admitting: Urology

## 2016-02-02 DIAGNOSIS — N5201 Erectile dysfunction due to arterial insufficiency: Secondary | ICD-10-CM

## 2016-02-02 DIAGNOSIS — E291 Testicular hypofunction: Secondary | ICD-10-CM | POA: Diagnosis not present

## 2016-02-18 ENCOUNTER — Emergency Department (HOSPITAL_COMMUNITY)
Admission: EM | Admit: 2016-02-18 | Discharge: 2016-02-18 | Disposition: A | Discharge status: Home / Self Care | Payer: 59 | Attending: Emergency Medicine | Admitting: Emergency Medicine

## 2016-02-18 ENCOUNTER — Emergency Department (HOSPITAL_COMMUNITY): Payer: 59

## 2016-02-18 ENCOUNTER — Encounter (HOSPITAL_COMMUNITY): Payer: Self-pay | Admitting: Emergency Medicine

## 2016-02-18 DIAGNOSIS — R079 Chest pain, unspecified: Secondary | ICD-10-CM | POA: Diagnosis not present

## 2016-02-18 DIAGNOSIS — R0789 Other chest pain: Secondary | ICD-10-CM

## 2016-02-18 DIAGNOSIS — I10 Essential (primary) hypertension: Secondary | ICD-10-CM | POA: Diagnosis not present

## 2016-02-18 DIAGNOSIS — G8929 Other chronic pain: Secondary | ICD-10-CM | POA: Diagnosis not present

## 2016-02-18 DIAGNOSIS — Z79899 Other long term (current) drug therapy: Secondary | ICD-10-CM | POA: Insufficient documentation

## 2016-02-18 DIAGNOSIS — Z7982 Long term (current) use of aspirin: Secondary | ICD-10-CM | POA: Diagnosis not present

## 2016-02-18 DIAGNOSIS — Z7984 Long term (current) use of oral hypoglycemic drugs: Secondary | ICD-10-CM | POA: Diagnosis not present

## 2016-02-18 DIAGNOSIS — E119 Type 2 diabetes mellitus without complications: Secondary | ICD-10-CM | POA: Diagnosis not present

## 2016-02-18 DIAGNOSIS — I2510 Atherosclerotic heart disease of native coronary artery without angina pectoris: Secondary | ICD-10-CM | POA: Insufficient documentation

## 2016-02-18 DIAGNOSIS — Z87891 Personal history of nicotine dependence: Secondary | ICD-10-CM | POA: Insufficient documentation

## 2016-02-18 HISTORY — DX: Chronic pain syndrome: G89.4

## 2016-02-18 HISTORY — DX: Insomnia, unspecified: G47.00

## 2016-02-18 HISTORY — DX: Fibromyalgia: M79.7

## 2016-02-18 HISTORY — DX: Cervicalgia: M54.2

## 2016-02-18 HISTORY — DX: Other chronic pain: G89.29

## 2016-02-18 LAB — CBC WITH DIFFERENTIAL/PLATELET
BASOS ABS: 0 10*3/uL (ref 0.0–0.1)
Basophils Relative: 0 %
EOS ABS: 0.1 10*3/uL (ref 0.0–0.7)
EOS PCT: 2 %
HCT: 41 % (ref 39.0–52.0)
Hemoglobin: 14.2 g/dL (ref 13.0–17.0)
Lymphocytes Relative: 29 %
Lymphs Abs: 2 10*3/uL (ref 0.7–4.0)
MCH: 30.6 pg (ref 26.0–34.0)
MCHC: 34.6 g/dL (ref 30.0–36.0)
MCV: 88.4 fL (ref 78.0–100.0)
MONO ABS: 0.6 10*3/uL (ref 0.1–1.0)
Monocytes Relative: 8 %
Neutro Abs: 4.1 10*3/uL (ref 1.7–7.7)
Neutrophils Relative %: 61 %
PLATELETS: 178 10*3/uL (ref 150–400)
RBC: 4.64 MIL/uL (ref 4.22–5.81)
RDW: 13.4 % (ref 11.5–15.5)
WBC: 6.8 10*3/uL (ref 4.0–10.5)

## 2016-02-18 LAB — BASIC METABOLIC PANEL
ANION GAP: 6 (ref 5–15)
BUN: 20 mg/dL (ref 6–20)
CHLORIDE: 106 mmol/L (ref 101–111)
CO2: 26 mmol/L (ref 22–32)
Calcium: 9 mg/dL (ref 8.9–10.3)
Creatinine, Ser: 1.02 mg/dL (ref 0.61–1.24)
GFR calc non Af Amer: >60 mL/min (ref >60)
Glucose, Bld: 145 mg/dL — ABNORMAL HIGH (ref 65–99)
POTASSIUM: 3.3 mmol/L — AB (ref 3.5–5.1)
SODIUM: 138 mmol/L (ref 135–145)

## 2016-02-18 LAB — TROPONIN I

## 2016-02-18 MED ORDER — NITROGLYCERIN 0.4 MG SL SUBL
0.4000 mg | SUBLINGUAL_TABLET | SUBLINGUAL | Status: DC | PRN
  Administered 2016-02-18: 0.4 mg via SUBLINGUAL
  Filled 2016-02-18: qty 1

## 2016-02-18 MED ORDER — ASPIRIN 81 MG PO CHEW
324.0000 mg | CHEWABLE_TABLET | Freq: Once | ORAL | Status: AC
  Administered 2016-02-18: 324 mg via ORAL
  Filled 2016-02-18: qty 4

## 2016-02-18 MED ORDER — FENTANYL CITRATE (PF) 100 MCG/2ML IJ SOLN
25.0000 ug | INTRAMUSCULAR | Status: DC | PRN
  Administered 2016-02-18: 25 ug via INTRAVENOUS
  Filled 2016-02-18: qty 2

## 2016-02-18 NOTE — Consult Note (Signed)
Requesting physician: Francine Graven, EDP  Primary Care Physician: Rubbie Battiest, NP  Reason for consultation: Potential admission   History of Present Illness: 60 y/o man with history of coronary artery disease with most recent in June 2015 that showed a mid LAD lesion of 50-60% with patent right coronary and circumflex, medical treatment was recommended. Also has a history of hypertension. He notes that he has been more physically active at work for the past week or so lifting heavy boxes. Yesterday his blood pressure was noted on several occasions to be as high as 220/120. This was coupled with a sense of severe fatigue. He also noticed that he was having a "lightning sensation" over his entire face and chest. He took extra doses of his blood pressure medications and this resolved, however his wife urged him to come to the ED today for evaluation. He does not describe chest pain and shortness of breath directly other than this "lightning sensation".. His blood pressure has normalized and on last check was 103/65. In the emergency department EKG does not show ischemic abnormalities, first troponin was negative at 0.03. We were asked to admit him for ACS rule out.  Allergies:   Allergies  Allergen Reactions  . Morphine And Related Itching  . Sulfonamide Derivatives Hives  . Vicodin [Hydrocodone-Acetaminophen] Rash      Past Medical History:  Diagnosis Date  . CAD (coronary artery disease)    a. LHC (12/19/13):  mid LAD 50-60%, EF 55%, patent CFX and RCA - med Rx.  . Cancer of kidney -status post nephrectomy    Kidney cancer 1999  . Chronic neck pain   . Chronic pain syndrome   . Diabetes mellitus   . Family history of long QT syndrome   . Fibromyalgia   . GERD (gastroesophageal reflux disease)   . Hx of echocardiogram    a.  Echo (12/2013):  mod LVH, EF 55-60%, no RWMA, mild LAE  . Hyperlipidemia   . Hypertension   . Insomnia   . Sleep apnea   . SVT (supraventricular  tachycardia) ([place])    Documentation pending but occurring during the stress test with Dr. 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 by Dr Lovena Le    Past Surgical History:  Procedure Laterality Date  . ABLATION  08/27/13   AVNRT ablatin by Dr Lovena Le  . CHOLECYSTECTOMY     2000  . elbow surgery     left  . INNER EAR SURGERY     R ear  . LEFT HEART CATHETERIZATION WITH CORONARY ANGIOGRAM N/A 12/19/2013   Procedure: LEFT HEART CATHETERIZATION WITH CORONARY ANGIOGRAM;  Surgeon: Sinclair Grooms, MD;  Location: [facility] CATH LAB;  Service: Cardiovascular;  Laterality: N/A;  . NASAL SINUS SURGERY     2005  . NEPHRECTOMY     L---1999 Cancer  . SUPRAVENTRICULAR TACHYCARDIA ABLATION N/A 08/27/2013   Procedure: SUPRAVENTRICULAR TACHYCARDIA ABLATION;  Surgeon: Evans Lance, MD;  Location: [facility] CATH LAB;  Service: Cardiovascular;  Laterality: N/A;  . TONSILLECTOMY     08/1988  . UVULOPALATOPHARYNGOPLASTY     08/1988    Scheduled Meds:  Continuous Infusions:  PRN Meds:.fentaNYL (SUBLIMAZE) injection, nitroGLYCERIN  Social History:  reports that he quit smoking about 17 years ago. His smoking use included Cigarettes. He has a 50.00 pack-year smoking history. He has never used smokeless tobacco. He reports that he drinks alcohol. He reports that he does not use drugs.  Family History  Problem Relation Age of  Onset  . Diabetes Father   . Hypertension Father   . Hyperlipidemia Father   . Colon polyps Father   . Hypertension Mother   . Diabetes Mother   . Colon cancer Neg Hx   . Stomach cancer Neg Hx     Review of Systems:  Constitutional: Denies fever, chills, diaphoresis, appetite change and fatigue.  HEENT: Denies photophobia, eye pain, redness, hearing loss, ear pain, congestion, sore throat, rhinorrhea, sneezing, mouth sores, trouble swallowing, neck pain, neck stiffness and tinnitus.   Respiratory: Denies SOB, DOE, cough, chest tightness,  and wheezing.   Cardiovascular: Denies chest pain,  palpitations and leg swelling.  Gastrointestinal: Denies nausea, vomiting, abdominal pain, diarrhea, constipation, blood in stool and abdominal distention.  Genitourinary: Denies dysuria, urgency, frequency, hematuria, flank pain and difficulty urinating.  Endocrine: Denies: hot or cold intolerance, sweats, changes in hair or nails, polyuria, polydipsia. Musculoskeletal: Denies myalgias, back pain, joint swelling, arthralgias and gait problem.  Skin: Denies pallor, rash and wound.  Neurological: Denies dizziness, seizures, syncope, weakness, light-headedness, numbness and headaches.  Hematological: Denies adenopathy. Easy bruising, personal or family bleeding history  Psychiatric/Behavioral: Denies suicidal ideation, mood changes, confusion, nervousness, sleep disturbance and agitation   Physical Exam: Blood pressure 125/87, pulse 66, temperature 98.4 F (36.9 C), temperature source Oral, resp. rate 13, height 5' 9"  (1.753 m), weight 109.8 kg (242 lb), SpO2 96 %. General: Alert, awake, oriented 3, no distress HEENT: Normocephalic, atraumatic, pupils equal round and reactive to light, extra movements intact, moist mucous membranes Neck: Supple, no JVD, no lymphadenopathy, no bruits, no goiter. Cardiovascular: Regular rate and rhythm, no murmurs, rubs or gallops. Lungs: Clear to auscultation bilaterally. Excellent abdomen: Soft, nontender, nondistended, positive bowel sounds, no masses or organomegaly noted. Extremities: No clubbing, cyanosis or edema, positive pulses. Neurologic: Grossly intact and nonfocal  Labs on Admission:  Results for orders placed or performed during the hospital encounter of 02/18/16 (from the past 48 hour(s))  Basic metabolic panel     Status: Abnormal   Collection Time: 02/18/16  7:41 AM  Result Value Ref Range   Sodium 138 135 - 145 mmol/L   Potassium 3.3 (L) 3.5 - 5.1 mmol/L   Chloride 106 101 - 111 mmol/L   CO2 26 22 - 32 mmol/L   Glucose, Bld 145 (H) 65 -  99 mg/dL   BUN 20 6 - 20 mg/dL   Creatinine, Ser 1.02 0.61 - 1.24 mg/dL   Calcium 9.0 8.9 - 10.3 mg/dL   GFR calc non Af Amer >60 >60 mL/min   GFR calc Af Amer >60 >60 mL/min    Comment: (NOTE) The eGFR has been calculated using the CKD EPI equation. This calculation has not been validated in all clinical situations. eGFR's persistently <60 mL/min signify possible Chronic Kidney Disease.    Anion gap 6 5 - 15  Troponin I     Status: None   Collection Time: 02/18/16  7:41 AM  Result Value Ref Range   Troponin I <0.03 <0.03 ng/mL  CBC with Differential     Status: None   Collection Time: 02/18/16  7:41 AM  Result Value Ref Range   WBC 6.8 4.0 - 10.5 K/uL   RBC 4.64 4.22 - 5.81 MIL/uL   Hemoglobin 14.2 13.0 - 17.0 g/dL   HCT 41.0 39.0 - 52.0 %   MCV 88.4 78.0 - 100.0 fL   MCH 30.6 26.0 - 34.0 pg   MCHC 34.6 30.0 - 36.0 g/dL   RDW  13.4 11.5 - 15.5 %   Platelets 178 150 - 400 K/uL   Neutrophils Relative % 61 %   Neutro Abs 4.1 1.7 - 7.7 K/uL   Lymphocytes Relative 29 %   Lymphs Abs 2.0 0.7 - 4.0 K/uL   Monocytes Relative 8 %   Monocytes Absolute 0.6 0.1 - 1.0 K/uL   Eosinophils Relative 2 %   Eosinophils Absolute 0.1 0.0 - 0.7 K/uL   Basophils Relative 0 %   Basophils Absolute 0.0 0.0 - 0.1 K/uL  Troponin I     Status: None   Collection Time: 02/18/16  9:35 AM  Result Value Ref Range   Troponin I <0.03 <0.03 ng/mL    Radiological Exams on Admission: Dg Chest 2 View  Result Date: 02/18/2016 CLINICAL DATA:  Central chest pain for 2 days, initial encounter EXAM: CHEST  2 VIEW COMPARISON:  12/18/2013 FINDINGS: The heart size and mediastinal contours are within normal limits. Both lungs are clear. The visualized skeletal structures are unremarkable. IMPRESSION: No active cardiopulmonary disease. Electronically Signed   By: Inez Catalina M.D.   On: 02/18/2016 08:16   Ct Head Wo Contrast  Result Date: 02/18/2016 CLINICAL DATA:  Posterior headaches EXAM: CT HEAD WITHOUT CONTRAST  TECHNIQUE: Contiguous axial images were obtained from the base of the skull through the vertex without intravenous contrast. COMPARISON:  None. FINDINGS: The bony calvarium is intact. The ventricles are of normal size and configuration. No findings to suggest acute hemorrhage, acute infarction or space-occupying mass lesion are noted. IMPRESSION: No acute abnormality noted. Electronically Signed   By: Inez Catalina M.D.   On: 02/18/2016 08:25    Assessment/Plan  Hypertension -Currently well controlled, advised to take medications as prescribed and have follow-up with his primary care physician within 1-2 weeks.  Questionable chest pain -Upon my interview with patient he does not describe chest pain or shortness of breath, he does describe extreme fatigue in the context of significant increased physical activity at work. -EKG with no acute ischemic abnormalities, 2 sets of troponins are negative. I do not believe he needs admission for ACS rule out. -Continue follow-up with outpatient cardiologist as scheduled.  Time Spent on Consultation: 65 minutes  [facility]  [phone] 02/18/2016, 11:57 AM

## 2016-02-18 NOTE — ED Provider Notes (Signed)
[place] DEPT Provider Note   CSN: [id number] Arrival date & time: 02/18/16  [id number]  First Provider Contact:  None       History   Chief Complaint Chief Complaint  Patient presents with  . Hypertension    HPI Jesse Vargas is a 60 y.o. male.  HPI  Pt was seen at 0705. Per pt, c/o gradual onset and persistence of constant "I don't feel right" for the past several days. Pt's wife states pt c/o posterior headache yesterday, so she took his BP and it was "220/120." Pt took an extra dose of his coreg and losartan x2 since last evening. Pt's wife states pt's SBP is "still in 220's," so she brought him to the ED for further evaluation. Pt endorses intermittent chest "tightness" for the past several days. Has been associated with SOB. Symptoms worsen with exertion and improve with rest. Denies headache was sudden or maximal at onset or at any time. Denies palpitations, no cough, no abd pain, no N/V/D, no back pain, no fevers, no visual changes, no focal motor weakness, no tingling/numbness in extremities, no ataxia, no slurred speech, no facial droop.      Past Medical History:  Diagnosis Date  . CAD (coronary artery disease)    a. LHC (12/19/13):  mid LAD 50-60%, EF 55%, patent CFX and RCA - med Rx.  . Cancer of kidney -status post nephrectomy    Kidney cancer 1999  . Chronic neck pain   . Chronic pain syndrome   . Diabetes mellitus   . Family history of long QT syndrome   . Fibromyalgia   . GERD (gastroesophageal reflux disease)   . Hx of echocardiogram    a.  Echo (12/2013):  mod LVH, EF 55-60%, no RWMA, mild LAE  . Hyperlipidemia   . Hypertension   . Insomnia   . Sleep apnea   . SVT (supraventricular tachycardia) ([place])    Documentation pending but occurring during the stress test with Dr. 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 by Dr Lovena Le    Patient Active Problem List   Diagnosis Date Noted  . Routine general medical examination at a health care facility 12/14/2014    . Flank pain 10/24/2014  . Severe obstructive sleep apnea 07/22/2014  . Mixed hyperlipidemia 07/22/2014  . Chest pain with moderate risk of acute coronary syndrome 12/18/2013  . Fatigue 12/18/2013  . Chest pain 12/18/2013  . Screen for STD (sexually transmitted disease) 09/30/2013  . Medication management 09/30/2013  . Paroxysmal supraventricular tachycardia ([place]) 08/27/2013  . Low testosterone 05/15/2013  . FHx: long QT syndrome 05/15/2013  . Obesity (BMI 30-39.9) 03/21/2013  . Ingrown left big toenail 03/21/2013  . Family history of long QT syndrome   . Fibromyalgia 07/09/2012  . CAD (coronary artery disease) - non-obstructive by [facility] 12/19/13 - med Rx 03/21/2011  . NEVI, MULTIPLE 07/25/2010  . 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 Malignant neoplasm of kidney excluding renal pelvis ([place]) 09/13/2009  . PROTEINURIA 09/13/2009  . ANXIETY DEPRESSION 08/13/2009  . Obstructive sleep apnea 08/13/2009  . ANXIETY STATE, UNSPECIFIED 05/10/2009  . BACK PAIN 05/10/2009  . ADD (attention deficit disorder) 12/08/2008  . Diabetes mellitus with diabetic nephropathy ([place]) 07/13/2008  . Hyperlipidemia 07/13/2008  . Essential hypertension 07/13/2008    Past Surgical History:  Procedure Laterality Date  . ABLATION  08/27/13   AVNRT ablatin by Dr Lovena Le  . CHOLECYSTECTOMY     2000  . elbow surgery  left  . INNER EAR SURGERY     R ear  . LEFT HEART CATHETERIZATION WITH CORONARY ANGIOGRAM N/A 12/19/2013   Procedure: LEFT HEART CATHETERIZATION WITH CORONARY ANGIOGRAM;  Surgeon: Sinclair Grooms, MD;  Location: [facility] CATH LAB;  Service: Cardiovascular;  Laterality: N/A;  . NASAL SINUS SURGERY     2005  . NEPHRECTOMY     L---1999 Cancer  . SUPRAVENTRICULAR TACHYCARDIA ABLATION N/A 08/27/2013   Procedure: SUPRAVENTRICULAR TACHYCARDIA ABLATION;  Surgeon: Evans Lance, MD;  Location: [facility] CATH LAB;  Service: Cardiovascular;  Laterality: N/A;  . TONSILLECTOMY      08/1988  . UVULOPALATOPHARYNGOPLASTY     08/1988       Home Medications    Prior to Admission medications   Medication Sig Start Date End Date Taking? Authorizing Provider  amLODipine (NORVASC) 5 MG tablet Take 0.5 tablets (2.5 mg total) by mouth 2 (two) times daily. 08/09/15   Fay Records, MD  aspirin EC 81 MG tablet Take 81 mg by mouth daily.    Historical Provider, MD  atorvastatin (LIPITOR) 40 MG tablet TAKE 1 TABLET BY MOUTH  DAILY AT 6 PM 09/20/15   Rubbie Battiest, NP  carvedilol (COREG) 25 MG tablet Take 1 tablet by mouth two  times daily 06/28/15   Fay Records, MD  cyclobenzaprine (FLEXERIL) 10 MG tablet Take 1 tablet at bedtime as needed for muscle spasms 06/11/14   Crecencio Mc, MD  fenofibrate 160 MG tablet Take 1 tablet by mouth  daily 09/20/15   Rubbie Battiest, NP  FLUoxetine (PROZAC) 40 MG capsule Take 1 capsule by mouth  daily 09/20/15   Rubbie Battiest, NP  fluticasone (FLONASE) 50 MCG/ACT nasal spray Place 2 sprays into both nostrils daily. 06/10/15   Rubbie Battiest, NP  gabapentin (NEURONTIN) 300 MG capsule Take 1 capsule by mouth 3  times daily 09/20/15   Rubbie Battiest, NP  losartan-hydrochlorothiazide (HYZAAR) 100-25 MG per tablet Take 1 tablet by mouth daily. 03/12/15   Fay Records, MD  losartan-hydrochlorothiazide [facility]) 100-25 MG tablet Take 1 tablet by mouth  daily 04/21/15   Crecencio Mc, MD  meloxicam [facility]) 15 MG tablet Take 1 tablet by mouth  daily 06/28/15   Crecencio Mc, MD  metFORMIN (GLUCOPHAGE) 1000 MG tablet Take 1500 mg with breakfast and 1000 mg with dinner. Do not exceed 2500 mg per day 03/18/15   Rubbie Battiest, NP  nitroGLYCERIN (NITROLINGUAL) 0.4 MG/SPRAY spray Place 1 spray under the tongue every 5 (five) minutes x 3 doses as needed for chest pain. 12/20/13   Liliane Shi, PA-C  oxyCODONE-acetaminophen (PERCOCET) 10-325 MG per tablet Take 1 tablet by mouth every 4 (four) hours as needed for pain.    Historical Provider, MD  pantoprazole (PROTONIX) 20  MG tablet Take 1 tablet by mouth two  times daily 09/20/15   Rubbie Battiest, NP  PROAIR HFA 108 (90 BASE) MCG/ACT inhaler INHALE 2 PUFFS INTO THE  LUNGS 4 TIMES DAILY AS  NEEDED FOR WHEEZING. 11/10/14   Rubbie Battiest, NP  tadalafil (CIALIS) 5 MG tablet Take 1 tablet (5 mg total) by mouth daily. 03/27/14   Raquel Dagoberto Ligas, NP  testosterone cypionate (DEPOTESTOTERONE CYPIONATE) 200 MG/ML injection Inject 1 mL into the muscle once a week. 03/05/14   Historical Provider, MD  traMADol (ULTRAM) 50 MG tablet Take 1 tablet (50 mg total) by mouth 3 (three) times daily as needed. 09/15/14  Crecencio Mc, MD  TURMERIC PO Take by mouth daily.    Historical Provider, MD    Family History Family History  Problem Relation Age of Onset  . Diabetes Father   . Hypertension Father   . Hyperlipidemia Father   . Colon polyps Father   . Hypertension Mother   . Diabetes Mother   . Colon cancer Neg Hx   . Stomach cancer Neg Hx     Social History Social History  Substance Use Topics  . Smoking status: Former Smoker    Packs/day: 2.00    Years: 25.00    Types: Cigarettes    Quit date: 03/20/1998  . Smokeless tobacco: Never Used  . Alcohol use Yes     Comment: rare     Allergies   Morphine and related; Sulfonamide derivatives; and Vicodin [hydrocodone-acetaminophen]   Review of Systems Review of Systems ROS: Statement: All systems negative except as marked or noted in the HPI; Constitutional: Negative for fever and chills. ; ; Eyes: Negative for eye pain, redness and discharge. ; ; ENMT: Negative for ear pain, hoarseness, nasal congestion, sinus pressure and sore throat. ; ; Cardiovascular: +CP, SOB. Negative for palpitations, diaphoresis, and peripheral edema. ; ; Respiratory: Negative for cough, wheezing and stridor. ; ; Gastrointestinal: Negative for nausea, vomiting, diarrhea, abdominal pain, blood in stool, hematemesis, jaundice and rectal bleeding. . ; ; Genitourinary: Negative for dysuria, flank pain and  hematuria. ; ; Musculoskeletal: Negative for back pain and neck pain. Negative for swelling and trauma.; ; Skin: Negative for pruritus, rash, abrasions, blisters, bruising and skin lesion.; ; Neuro: +headache. Negative for lightheadedness and neck stiffness. Negative for weakness, altered level of consciousness, altered mental status, extremity weakness, paresthesias, involuntary movement, seizure and syncope.       Physical Exam Updated Vital Signs BP 105/60   Pulse 62   Temp 98.4 F (36.9 C) (Oral)   Resp 16   Ht 5\' 9"  (1.753 m)   Wt 242 lb (109.8 kg)   SpO2 95%   BMI 35.74 kg/m   Patient Vitals for the past 24 hrs:  BP Pulse Resp SpO2  02/18/16 1315 131/85 - 14 -  02/18/16 1245 - 69 17 -  02/18/16 1230 128/70 (!) 59 13 97 %  02/18/16 1215 129/73 (!) 58 12 95 %  02/18/16 1200 131/75 64 13 95 %  02/18/16 1145 130/82 64 15 95 %  02/18/16 1130 - - - 96 %  02/18/16 1130 125/87 66 13 94 %  02/18/16 1115 113/68 60 15 95 %  02/18/16 1100 120/67 63 12 96 %  02/18/16 1030 115/71 (!) 56 12 95 %  02/18/16 1015 124/86 (!) 57 11 97 %  02/18/16 1000 127/82 (!) 56 12 97 %  02/18/16 0945 120/85 (!) 56 10 94 %  02/18/16 0930 126/78 (!) 57 12 96 %  02/18/16 0915 128/84 61 11 95 %  02/18/16 0900 107/63 (!) 56 12 94 %  02/18/16 0845 120/79 (!) 57 13 94 %  02/18/16 0815 116/92 - 13 -  02/18/16 0800 105/60 62 16 95 %  02/18/16 0755 101/60 67 25 95 %  02/18/16 0746 113/78 67 15 93 %     Physical Exam 0710: Physical examination:  Nursing notes reviewed; Vital signs and O2 SAT reviewed;  Constitutional: Well developed, Well nourished, Well hydrated, In no acute distress; Head:  Normocephalic, atraumatic; Eyes: EOMI, PERRL, No scleral icterus; ENMT: Mouth and pharynx normal, Mucous membranes moist; Neck:  Supple, Full range of motion, No lymphadenopathy; Cardiovascular: Regular rate and rhythm, No gallop; Respiratory: Breath sounds clear & equal bilaterally, No wheezes.  Speaking full sentences  with ease, Normal respiratory effort/excursion; Chest: Nontender, Movement normal; Abdomen: Soft, Nontender, Nondistended, Normal bowel sounds; Genitourinary: No CVA tenderness; Extremities: Pulses normal, No tenderness, No edema, No calf edema or asymmetry.; Neuro: AA&Ox3, Major CN grossly intact. Speech clear.  No facial droop.  No nystagmus. Grips equal. Strength 5/5 equal bilat UE's and LE's.  DTR 2/4 equal bilat UE's and LE's.  No gross sensory deficits.  Normal cerebellar testing bilat UE's (finger-nose) and LE's (heel-shin). Climbs on and off stretcher easily by himself. Gait steady..; Skin: Color normal, Warm, Dry.   ED Treatments / Results  Labs (all labs ordered are listed, but only abnormal results are displayed)   EKG  EKG Interpretation  Date/Time:  Friday February 18 2016 07:07:05 EDT Ventricular Rate:  66 PR Interval:    QRS Duration: 97 QT Interval:  424 QTC Calculation: 445 R Axis:   180 Text Interpretation:  Sinus rhythm Right axis deviation Nonspecific T wave abnormality Baseline wander When compared with ECG of 12/19/2013 No significant change was found Confirmed by [place]a  MD, Nunzio Cory [phone]) on 02/18/2016 8:03:34 AM       Radiology   Procedures Procedures (including critical care time)  Medications Ordered in ED Medications  nitroGLYCERIN (NITROSTAT) SL tablet 0.4 mg (0.4 mg Sublingual Given 02/18/16 0740)  fentaNYL (SUBLIMAZE) injection 25 mcg (25 mcg Intravenous Given 02/18/16 0734)  aspirin chewable tablet 324 mg (324 mg Oral Given 02/18/16 0733)     Initial Impression / Assessment and Plan / ED Course  I have reviewed the triage vital signs and the nursing notes.  Pertinent labs & imaging results that were available during my care of the patient were reviewed by me and considered in my medical decision making (see chart for details).  MDM Reviewed: nursing note, vitals and previous chart Reviewed previous: labs and ECG Interpretation: labs, ECG, x-ray  and CT scan   Results for orders placed or performed during the hospital encounter of [id number]  Basic metabolic panel  Result Value Ref Range   Sodium 138 135 - 145 mmol/L   Potassium 3.3 (L) 3.5 - 5.1 mmol/L   Chloride 106 101 - 111 mmol/L   CO2 26 22 - 32 mmol/L   Glucose, Bld 145 (H) 65 - 99 mg/dL   BUN 20 6 - 20 mg/dL   Creatinine, Ser 1.02 0.61 - 1.24 mg/dL   Calcium 9.0 8.9 - 10.3 mg/dL   GFR calc non Af Amer >60 >60 mL/min   GFR calc Af Amer >60 >60 mL/min   Anion gap 6 5 - 15  Troponin I  Result Value Ref Range   Troponin I <0.03 <0.03 ng/mL  CBC with Differential  Result Value Ref Range   WBC 6.8 4.0 - 10.5 K/uL   RBC 4.64 4.22 - 5.81 MIL/uL   Hemoglobin 14.2 13.0 - 17.0 g/dL   HCT 41.0 39.0 - 52.0 %   MCV 88.4 78.0 - 100.0 fL   MCH 30.6 26.0 - 34.0 pg   MCHC 34.6 30.0 - 36.0 g/dL   RDW 13.4 11.5 - 15.5 %   Platelets 178 150 - 400 K/uL   Neutrophils Relative % 61 %   Neutro Abs 4.1 1.7 - 7.7 K/uL   Lymphocytes Relative 29 %   Lymphs Abs 2.0 0.7 - 4.0 K/uL   Monocytes Relative  8 %   Monocytes Absolute 0.6 0.1 - 1.0 K/uL   Eosinophils Relative 2 %   Eosinophils Absolute 0.1 0.0 - 0.7 K/uL   Basophils Relative 0 %   Basophils Absolute 0.0 0.0 - 0.1 K/uL   Dg Chest 2 View Result Date: 02/18/2016 CLINICAL DATA:  Central chest pain for 2 days, initial encounter EXAM: CHEST  2 VIEW COMPARISON:  12/18/2013 FINDINGS: The heart size and mediastinal contours are within normal limits. Both lungs are clear. The visualized skeletal structures are unremarkable. IMPRESSION: No active cardiopulmonary disease. Electronically Signed   By: Inez Catalina M.D.   On: 02/18/2016 08:16   Ct Head Wo Contrast Result Date: 02/18/2016 CLINICAL DATA:  Posterior headaches EXAM: CT HEAD WITHOUT CONTRAST TECHNIQUE: Contiguous axial images were obtained from the base of the skull through the vertex without intravenous contrast. COMPARISON:  None. FINDINGS: The bony calvarium is intact. The  ventricles are of normal size and configuration. No findings to suggest acute hemorrhage, acute infarction or space-occupying mass lesion are noted. IMPRESSION: No acute abnormality noted. Electronically Signed   By: Inez Catalina M.D.   On: 02/18/2016 08:25     0845:  Pt endorsed CP while in the ED: ASA, SL ntg given with improvement. HEART score 5; will observation admit.  T/C to [facility] Dr. Jerilee Hoh, case discussed, including:  HPI, pertinent PM/SHx, VS/PE, dx testing, ED course and treatment:  Agreeable to come see the pt in the ED.   73:  [facility] Dr. Jerilee Hoh has evaluated pt while in the ED: states no indication for admission at this time, requests EDP d/c pt with outpt f/u (seen consult note). Pt and family aware.      Final Clinical Impressions(s) / ED Diagnoses   Final diagnoses:  None    New Prescriptions New Prescriptions   No medications on file     Francine Graven, DO 02/19/16 [phone]

## 2016-02-18 NOTE — Discharge Instructions (Signed)
Take your usual prescriptions as previously directed.  Take your blood pressure only once per day, either in the morning approximately 1 hour after you take your medicine(s) or in the evening before you go to bed, only once or twice per week.  Always sit quietly for at least 15 minutes before taking your blood pressure.  Keep a diary of your blood pressures to show your doctor at your follow up office visit.  Call your regular medical doctor and your Cardiologist today to schedule a follow up appointment within the next 3 days.  Return to the Emergency Department immediately sooner if worsening.

## 2016-02-18 NOTE — ED Notes (Signed)
Patients family states patient blood pressure is going back up. BP at this time is 142/90. RN notified.

## 2016-02-18 NOTE — ED Triage Notes (Addendum)
Pt reports hypertension starting yesterday, pts wife is a nurse and was taking manual blood pressures last night and one read 220/120.  Pt took coreg and losartan twice last night.  Pt says he "just doesn't feel right and I have a headache."  Pt states he feels like his bilateral face is numb.  Pt also experienced left sided  cp last night with no radiation, none at this time. P

## 2016-02-22 ENCOUNTER — Ambulatory Visit: Payer: 59 | Admitting: Pain Medicine

## 2016-03-02 ENCOUNTER — Ambulatory Visit: Payer: 59 | Attending: Pain Medicine | Admitting: Pain Medicine

## 2016-03-02 ENCOUNTER — Encounter: Payer: Self-pay | Admitting: Pain Medicine

## 2016-03-02 VITALS — BP 141/83 | HR 76 | Temp 96.1°F | Resp 16 | Ht 69.0 in | Wt 138.0 lb

## 2016-03-02 DIAGNOSIS — M069 Rheumatoid arthritis, unspecified: Secondary | ICD-10-CM | POA: Diagnosis not present

## 2016-03-02 DIAGNOSIS — F418 Other specified anxiety disorders: Secondary | ICD-10-CM | POA: Diagnosis not present

## 2016-03-02 DIAGNOSIS — M5481 Occipital neuralgia: Secondary | ICD-10-CM | POA: Diagnosis not present

## 2016-03-02 DIAGNOSIS — F410 Panic disorder [episodic paroxysmal anxiety] without agoraphobia: Secondary | ICD-10-CM | POA: Diagnosis not present

## 2016-03-02 DIAGNOSIS — R51 Headache: Secondary | ICD-10-CM | POA: Diagnosis present

## 2016-03-02 DIAGNOSIS — M5136 Other intervertebral disc degeneration, lumbar region: Secondary | ICD-10-CM | POA: Diagnosis not present

## 2016-03-02 DIAGNOSIS — M797 Fibromyalgia: Secondary | ICD-10-CM | POA: Insufficient documentation

## 2016-03-02 DIAGNOSIS — I10 Essential (primary) hypertension: Secondary | ICD-10-CM | POA: Diagnosis not present

## 2016-03-02 DIAGNOSIS — M47816 Spondylosis without myelopathy or radiculopathy, lumbar region: Secondary | ICD-10-CM | POA: Insufficient documentation

## 2016-03-02 DIAGNOSIS — M47812 Spondylosis without myelopathy or radiculopathy, cervical region: Secondary | ICD-10-CM

## 2016-03-02 DIAGNOSIS — M5030 Other cervical disc degeneration, unspecified cervical region: Secondary | ICD-10-CM | POA: Diagnosis not present

## 2016-03-02 DIAGNOSIS — K219 Gastro-esophageal reflux disease without esophagitis: Secondary | ICD-10-CM | POA: Insufficient documentation

## 2016-03-02 DIAGNOSIS — E1140 Type 2 diabetes mellitus with diabetic neuropathy, unspecified: Secondary | ICD-10-CM | POA: Insufficient documentation

## 2016-03-02 NOTE — Progress Notes (Signed)
The patient is a 60 year old gentleman who comes to pain management Center at the request of C Doss and Dr. Kary Kos for further evaluation and treatment of pain involving the neck associated with headaches  The patient states that his condition began approximately 4 years ago in 2013 when he was injured at work. The patient states that the pain involves the upper part of the back with pain radiating of the neck to the back of the head causing significant headaches. The patient admits to pain of the mid and lower back region as well as well with the most bothersome pain involving the area between the upper back and neck and headaches. The patient described this pain as burning hot pressure-like pulsating sharp shooting stabbing pain. The patient stated that the pain awakened him from sleep and was increased with lifting sitting walking. The patient stated the pain is decreased in the past with resting sitting sleeping warm showers and baths and lying down stretching and medications. Patient also admits to treatment and previous pain clinic where patient stated that he was not satisfied and therefore decided to seek treatment elsewhere. We informed patient that we would need to review the results of studies and additional records and that we would consider prescribing medications for treatment of patient's condition.. The patient was with understanding and agreement suggested treatment plan. Patient stated that his most bothersome condition involving the area of the neck and headaches. We informed patient that we would proceed with greater occipital nerve block at time return appointment in attempt to decrease severity of patient's symptoms, minimize progression of patient's symptoms, and avoid the need for more involved treatment. The patient was with understanding and agreed to suggested treatment plan.     Cardiovascular: High blood  pressure  Pulmonary: Unremarkable  Neurological: Unremarkable  Psychological Anxiety Depression Panic attacks  Gastrointestinal: Gastroesophageal reflux disease  Genitourinary: Kidney disease  Hematologic: Unremarkable  Endocrine: Diabetes mellitus  Rheumatological: Rheumatoid arthritis  Musculoskeletal: Unremarkable  Other significant: Unremarkable       Physical examination  There was tenderness to palpation of the paraspinal must reason cervical region cervical facet region a moderate degree. There was moderate to moderately severe tenderness of the splenius capitis and occipitalis regions. No bounding pulsations of the temporal region noted. There was tenderness over the trapezius levator scapula rhomboid musculature regions of moderate to moderately severe degree as well. Palpation of the acromioclavicular and glenohumeral joint regions reproduced pain of mild-to-moderate degree. The patient was able to perform drop test without significant difficulty. The patient appeared to be with bilaterally equal grip strength without increased pain with Tinel and Phalen's maneuver. Palpation over the thoracic region was attends to palpation without crepitus of the thoracic region noted. Palpation over the lumbar paraspinal must reason lumbar facet region was with moderate tenderness to palpation with lateral bending rotation extension and palpation over the lumbar facets reproducing moderate discomfort. There was moderate tenderness of the PSIS PII S region as well as mild tenderness of the greater trochanteric region iliotibial band region. Straight leg raise was tolerates 30 without increase of pain with dorsiflexion noted. There was tenderness to palpation of the knees with negative anterior and posterior drawer signs without ballottement of the patella. No definite sensory deficit of dermatomal distribution detected. DTRs were difficult to elicit appeared to be trace at the  knees.. There was no increased warmth and erythema of the joints noted. There was negative clonus negative Homans. The abdomen was nontender and no  costovertebral angle tenderness was noted.    Assessment   Degenerative disc disease cervical spine  Bilateral occipital neuralgia  Cervical facet syndrome  Cervicogenic headaches  Diabetic neuropathy  Fibromyalgia  Degenerative disc disease lumbar spine  Lumbar facet syndrome  Rheumatoid arthritis     PLAN  Continue present medications Neurontin and oxycodone acetaminophen for now . We will consider removing the Tylenol from the oxycodone should we decide to begin to prescribe medications for treatment of your pain as we discussed today. We will also consider increasing Neurontin 300 mg to 300 mg in the morning, 300 mg midday, and 600 mg evening   Greater occipital nerve block to be performed at time return appointment  F/U PCP Dr. Kary Kos  for evaluation of  BP , rheumatoid arthritis ,and general medical  condition  F/U surgical evaluation. May consider pending follow-up evaluations  F/U neurological evaluation. May consider pending follow-up evaluations  Rheumatological follow-up evaluation  May consider radiofrequency rhizolysis or intraspinal procedures pending response to present treatment and F/U evaluation   Patient to call Pain Management Center should patient have concerns prior to scheduled return appointment.

## 2016-03-02 NOTE — Patient Instructions (Addendum)
PLAN  Continue present medications Neurontin and oxycodone acetaminophen for now . We will consider removing the Tylenol from the oxycodone should be decided to begin to prescribe medications for treatment of your pain as we discussed today  Greater occipital nerve block to be performed at time return appointment  F/U PCP Dr. Kary Kos  for evaluation of  BP , rheumatoid arthritis ,and general medical  condition  F/U surgical evaluation. May consider pending follow-up evaluations  F/U neurological evaluation. May consider pending follow-up evaluations  Rheumatological follow-up evaluation  May consider radiofrequency rhizolysis or intraspinal procedures pending response to present treatment and F/U evaluation   Patient to call [facility] should patient have concerns prior to scheduled return appointment. GENERAL RISKS AND COMPLICATIONS  What are the risk, side effects and possible complications? Generally speaking, most procedures are safe.  However, with any procedure there are risks, side effects, and the possibility of complications.  The risks and complications are dependent upon the sites that are lesioned, or the type of nerve block to be performed.  The closer the procedure is to the spine, the more serious the risks are.  Great care is taken when placing the radio frequency needles, block needles or lesioning probes, but sometimes complications can occur. 1. Infection: Any time there is an injection through the skin, there is a risk of infection.  This is why sterile conditions are used for these blocks.  There are four possible types of infection. 1. Localized skin infection. 2. Central Nervous System Infection-This can be in the form of Meningitis, which can be deadly. 3. Epidural Infections-This can be in the form of an epidural abscess, which can cause pressure inside of the spine, causing compression of the spinal cord with subsequent paralysis. This would require an  emergency surgery to decompress, and there are no guarantees that the patient would recover from the paralysis. 4. Discitis-This is an infection of the intervertebral discs.  It occurs in about 1% of discography procedures.  It is difficult to treat and it may lead to surgery.        2. Pain: the needles have to go through skin and soft tissues, will cause soreness.       3. Damage to internal structures:  The nerves to be lesioned may be near blood vessels or    other nerves which can be potentially damaged.       4. Bleeding: Bleeding is more common if the patient is taking blood thinners such as  aspirin, Coumadin, Ticiid, Plavix, etc., or if he/she have some genetic predisposition  such as hemophilia. Bleeding into the spinal canal can cause compression of the spinal  cord with subsequent paralysis.  This would require an emergency surgery to  decompress and there are no guarantees that the patient would recover from the  paralysis.       5. Pneumothorax:  Puncturing of a lung is a possibility, every time a needle is introduced in  the area of the chest or upper back.  Pneumothorax refers to free air around the  collapsed lung(s), inside of the thoracic cavity (chest cavity).  Another two possible  complications related to a similar event would include: Hemothorax and Chylothorax.   These are variations of the Pneumothorax, where instead of air around the collapsed  lung(s), you may have blood or chyle, respectively.       6. Spinal headaches: They may occur with any procedures in the area of the spine.  7. Persistent CSF (Cerebro-Spinal Fluid) leakage: This is a rare problem, but may occur  with prolonged intrathecal or epidural catheters either due to the formation of a fistulous  track or a dural tear.       8. Nerve damage: By working so close to the spinal cord, there is always a possibility of  nerve damage, which could be as serious as a permanent spinal cord injury with  paralysis.        9. Death:  Although rare, severe deadly allergic reactions known as "Anaphylactic  reaction" can occur to any of the medications used.      10. Worsening of the symptoms:  We can always make thing worse.  What are the chances of something like this happening? Chances of any of this occuring are extremely low.  By statistics, you have more of a chance of getting killed in a motor vehicle accident: while driving to the hospital than any of the above occurring .  Nevertheless, you should be aware that they are possibilities.  In general, it is similar to taking a shower.  Everybody knows that you can slip, hit your head and get killed.  Does that mean that you should not shower again?  Nevertheless always keep in mind that statistics do not mean anything if you happen to be on the wrong side of them.  Even if a procedure has a 1 (one) in a 1,000,000 (million) chance of going wrong, it you happen to be that one..Also, keep in mind that by statistics, you have more of a chance of having something go wrong when taking medications.  Who should not have this procedure? If you are on a blood thinning medication (e.g. Coumadin, Plavix, see list of "Blood Thinners"), or if you have an active infection going on, you should not have the procedure.  If you are taking any blood thinners, please inform your physician.  How should I prepare for this procedure?  Do not eat or drink anything at least six hours prior to the procedure.  Bring a driver with you .  It cannot be a taxi.  Come accompanied by an adult that can drive you back, and that is strong enough to help you if your legs get weak or numb from the local anesthetic.  Take all of your medicines the morning of the procedure with just enough water to swallow them.  If you have diabetes, make sure that you are scheduled to have your procedure done first thing in the morning, whenever possible.  If you have diabetes, take only half of your insulin dose and  notify our nurse that you have done so as soon as you arrive at the clinic.  If you are diabetic, but only take blood sugar pills (oral hypoglycemic), then do not take them on the morning of your procedure.  You may take them after you have had the procedure.  Do not take aspirin or any aspirin-containing medications, at least eleven (11) days prior to the procedure.  They may prolong bleeding.  Wear loose fitting clothing that may be easy to take off and that you would not mind if it got stained with Betadine or blood.  Do not wear any jewelry or perfume  Remove any nail coloring.  It will interfere with some of our monitoring equipment.  NOTE: Remember that this is not meant to be interpreted as a complete list of all possible complications.  Unforeseen problems may occur.  BLOOD THINNERS  The following drugs contain aspirin or other products, which can cause increased bleeding during surgery and should not be taken for 2 weeks prior to and 1 week after surgery.  If you should need take something for relief of minor pain, you may take acetaminophen which is found in Tylenol,m Datril, Anacin-3 and Panadol. It is not blood thinner. The products listed below are.  Do not take any of the products listed below in addition to any listed on your instruction sheet.  A.P.C or A.P.C with Codeine Codeine Phosphate Capsules #3 Ibuprofen Ridaura  ABC compound Congesprin Imuran rimadil  Advil Cope Indocin Robaxisal  Alka-Seltzer Effervescent Pain Reliever and Antacid Coricidin or Coricidin-D  Indomethacin Rufen  Alka-Seltzer plus Cold Medicine Cosprin Ketoprofen S-A-C Tablets  Anacin Analgesic Tablets or Capsules Coumadin Korlgesic Salflex  Anacin Extra Strength Analgesic tablets or capsules CP-2 Tablets Lanoril Salicylate  Anaprox Cuprimine Capsules Levenox Salocol  Anexsia-D Dalteparin Magan Salsalate  Anodynos Darvon compound Magnesium Salicylate Sine-off  Ansaid Dasin Capsules Magsal Sodium  Salicylate  Anturane Depen Capsules Marnal Soma  APF Arthritis pain formula Dewitt's Pills Measurin Stanback  Argesic Dia-Gesic Meclofenamic Sulfinpyrazone  Arthritis Bayer Timed Release Aspirin Diclofenac Meclomen Sulindac  Arthritis pain formula Anacin Dicumarol Medipren Supac  Analgesic (Safety coated) Arthralgen Diffunasal Mefanamic Suprofen  Arthritis Strength Bufferin Dihydrocodeine Mepro Compound Suprol  Arthropan liquid Dopirydamole Methcarbomol with Aspirin Synalgos  ASA tablets/Enseals Disalcid Micrainin Tagament  Ascriptin Doan's Midol Talwin  Ascriptin A/D Dolene Mobidin Tanderil  Ascriptin Extra Strength Dolobid Moblgesic Ticlid  Ascriptin with Codeine Doloprin or Doloprin with Codeine Momentum Tolectin  Asperbuf Duoprin Mono-gesic Trendar  Aspergum Duradyne Motrin or Motrin IB Triminicin  Aspirin plain, buffered or enteric coated Durasal Myochrisine Trigesic  Aspirin Suppositories Easprin Nalfon Trillsate  Aspirin with Codeine Ecotrin Regular or Extra Strength Naprosyn Uracel  Atromid-S Efficin Naproxen Ursinus  Auranofin Capsules Elmiron Neocylate Vanquish  Axotal Emagrin Norgesic Verin  Azathioprine Empirin or Empirin with Codeine Normiflo Vitamin E  Azolid Emprazil Nuprin Voltaren  Bayer Aspirin plain, buffered or children's or timed BC Tablets or powders Encaprin Orgaran Warfarin Sodium  Buff-a-Comp Enoxaparin Orudis Zorpin  Buff-a-Comp with Codeine Equegesic Os-Cal-Gesic   Buffaprin Excedrin plain, buffered or Extra Strength Oxalid   Bufferin Arthritis Strength Feldene Oxphenbutazone   Bufferin plain or Extra Strength Feldene Capsules Oxycodone with Aspirin   Bufferin with Codeine Fenoprofen Fenoprofen Pabalate or Pabalate-SF   Buffets II Flogesic Panagesic   Buffinol plain or Extra Strength Florinal or Florinal with Codeine Panwarfarin   Buf-Tabs Flurbiprofen Penicillamine   Butalbital Compound Four-way cold tablets Penicillin   Butazolidin Fragmin Pepto-Bismol    Carbenicillin Geminisyn Percodan   Carna Arthritis Reliever Geopen Persantine   Carprofen Gold's salt Persistin   Chloramphenicol Goody's Phenylbutazone   Chloromycetin Haltrain Piroxlcam   Clmetidine heparin Plaquenil   Cllnoril Hyco-pap Ponstel   Clofibrate Hydroxy chloroquine Propoxyphen         Before stopping any of these medications, be sure to consult the physician who ordered them.  Some, such as Coumadin (Warfarin) are ordered to prevent or treat serious conditions such as "deep thrombosis", "pumonary embolisms", and other heart problems.  The amount of time that you may need off of the medication may also vary with the medication and the reason for which you were taking it.  If you are taking any of these medications, please make sure you notify your pain physician before you undergo any procedures.         Occipital Nerve Block  Patient Information  Description: The occipital nerves originate in the cervical (neck) spinal cord and travel upward through muscle and tissue to supply sensation to the back of the head and top of the scalp.  In addition, the nerves control some of the muscles of the scalp.  Occipital neuralgia is an irritation of these nerves which can cause headaches, numbness of the scalp, and neck discomfort.     The occipital nerve block will interrupt nerve transmission through these nerves and can relieve pain and spasm.  The block consists of insertion of a small needle under the skin in the back of the head to deposit local anesthetic (numbing medicine) and/or steroids around the nerve.  The entire block usually lasts less than 5 minutes.  Conditions which may be treated by occipital blocks:   Muscular pain and spasm of the scalp  Nerve irritation, back of the head  Headaches  Upper neck pain  Preparation for the injection:  12. Do not eat any solid food or dairy products within 8 hours of your appointment. 13. You may drink clear liquids up to 3  hours before appointment.  Clear liquids include water, black coffee, juice or soda.  No milk or cream please. 14. You may take your regular medication, including pain medications, with a sip of water before you appointment.  Diabetics should hold regular insulin (if taken separately) and take 1/2 normal NPH dose the morning of the procedure.  Carry some sugar containing items with you to your appointment. 15. A driver must accompany you and be prepared to drive you home after your procedure. 60. Bring all your current medications with you. 17. An IV may be inserted and sedation may be given at the discretion of the physician. 18. A blood pressure cuff, EKG, and other monitors will often be applied during the procedure.  Some patients may need to have extra oxygen administered for a short period. 21. You will be asked to provide medical information, including your allergies and medications, prior to the procedure.  We must know immediately if you are taking blood thinners (like Coumadin/Warfarin) or if you are allergic to IV iodine contrast (dye).  We must know if you could possible be pregnant.  20. Do not wear a high collared shirt or turtleneck.  Tie long hair up in the back if possible.  Possible side-effects:   Bleeding from needle site  Infection (rare, may require surgery)  Nerve injury (rare)  Hair on back of neck can be tinged with iodine scrub (this will wash out)  Light-headedness (temporary)  Pain at injection site (several days)  Decreased blood pressure (rare, temporary)  Seizure (very rare)  Call if you experience:   Hives or difficulty breathing ( go to the emergency room)  Inflammation or drainage at the injection site(s)  Please note:  Although the local anesthetic injected can often make your painful muscles or headache feel good for several hours after the injection, the pain may return.  It takes 3-7 days for steroids to work.  You may not notice any pain  relief for at least one week.  If effective, we will often do a series of injections spaced 3-6 weeks apart to maximally decrease your pain.  If you have any questions, please call [phone] [facility]

## 2016-03-03 ENCOUNTER — Other Ambulatory Visit: Payer: Self-pay

## 2016-03-11 LAB — TOXASSURE SELECT 13 (MW), URINE: PDF: 0

## 2016-03-13 NOTE — Progress Notes (Signed)
Reviewed

## 2016-03-14 ENCOUNTER — Telehealth: Payer: Self-pay

## 2016-03-14 NOTE — Telephone Encounter (Signed)
Patient notified that DR Primus Bravo is no longer going to be practicing at this office and that he would need to contact new office in order to get scheduled for appt to follow new patient visit.  Patient verbalizes u/o information.  Dr Primus Bravo phone number given.

## 2016-03-14 NOTE — Telephone Encounter (Signed)
Patient wants to know if Dr. Primus Bravo is going to take over his pain meds. He is running out.

## 2016-03-21 ENCOUNTER — Encounter: Payer: Self-pay | Admitting: Gastroenterology

## 2016-03-30 ENCOUNTER — Other Ambulatory Visit: Payer: Self-pay | Admitting: Nurse Practitioner

## 2016-03-30 ENCOUNTER — Other Ambulatory Visit: Payer: Self-pay | Admitting: Internal Medicine

## 2016-05-15 ENCOUNTER — Other Ambulatory Visit: Payer: Self-pay | Admitting: Internal Medicine

## 2016-05-15 ENCOUNTER — Other Ambulatory Visit: Payer: Self-pay | Admitting: Family Medicine

## 2016-06-26 ENCOUNTER — Other Ambulatory Visit: Payer: Self-pay | Admitting: Internal Medicine

## 2016-06-27 NOTE — Telephone Encounter (Signed)
PT last refill for Meloxicam was on 05/08/16, last labs were on 01/04/15 and last OV was with Lorane Gell on 03/25/15 with no scheduled future appt to est care with new PCP.

## 2016-08-25 ENCOUNTER — Telehealth: Payer: Self-pay | Admitting: *Deleted

## 2016-08-25 NOTE — Telephone Encounter (Signed)
CPAP supply order sent to [facility].

## 2017-05-09 ENCOUNTER — Telehealth: Payer: Self-pay | Admitting: Internal Medicine

## 2017-05-09 NOTE — Telephone Encounter (Signed)
New message    Pt c/o Shortness Of Breath: STAT if SOB developed within the last 24 hours or pt is noticeably SOB on the phone  1. Are you currently SOB (can you hear that pt is SOB on the phone)? No   2. How long have you been experiencing SOB? 4-5 days breathing heavy   3. Are you SOB when sitting or when up moving around? Both   4. Are you currently experiencing any other symptoms? No

## 2017-05-09 NOTE — Telephone Encounter (Signed)
Pt states that for the last 2 weeks pt has experienced SOB with activity and at rest. Pt would like to make an appointment with Dr. Harrington Challenger or extender . It doesn't have to be today. An appointment was  made with Ermalinda Barrios PA on 05/28/18 at 9:30 AM. Pt is aware.he verbalized understanding.

## 2017-05-28 ENCOUNTER — Ambulatory Visit: Payer: 59 | Admitting: Physician Assistant

## 2017-10-20 IMAGING — DX DG CHEST 2V
2 series · 2 of 2 positions shown · non-contrast
Comparison: 12/18/2013

CLINICAL DATA: Central chest pain for 2 days, initial encounter

EXAM:
CHEST  2 VIEW

[chest pa]
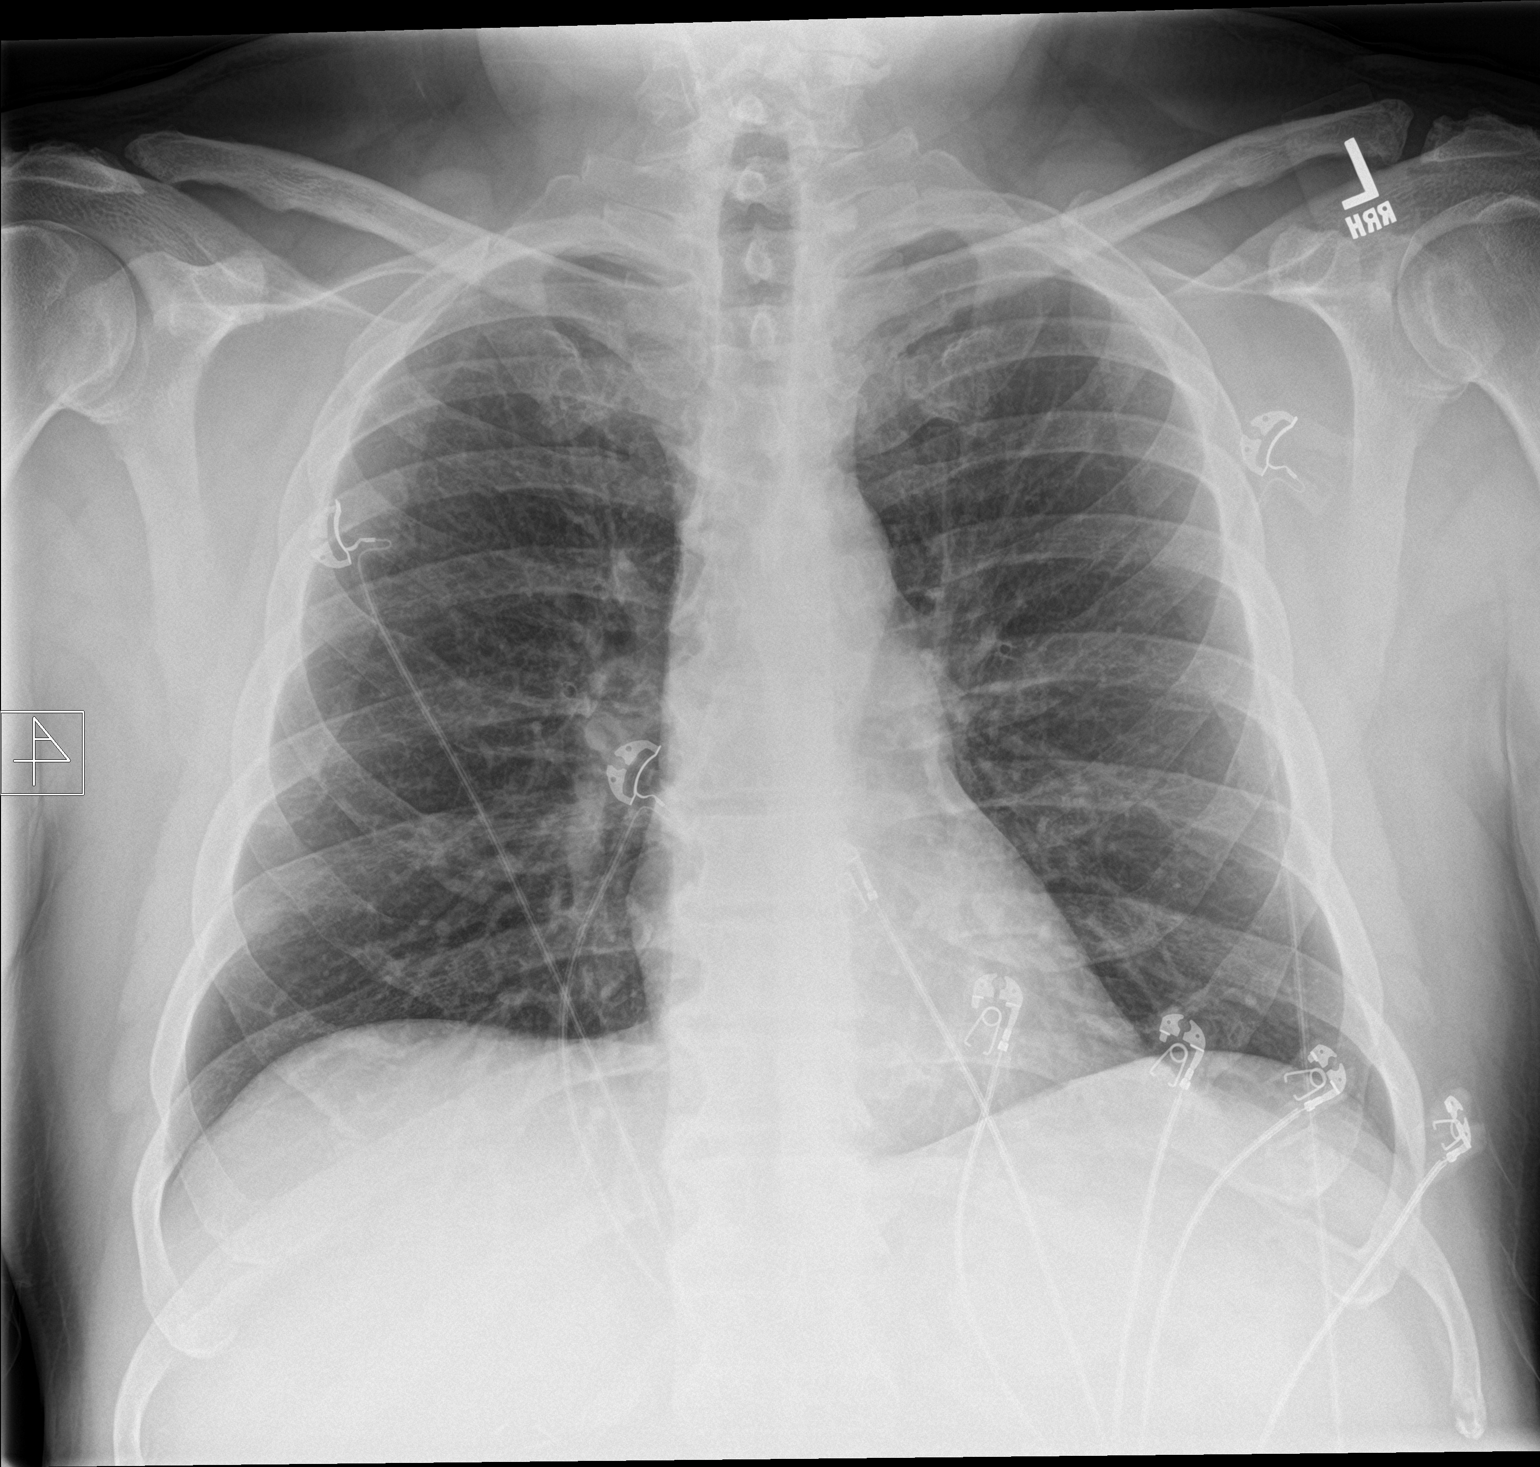

[chest lat]
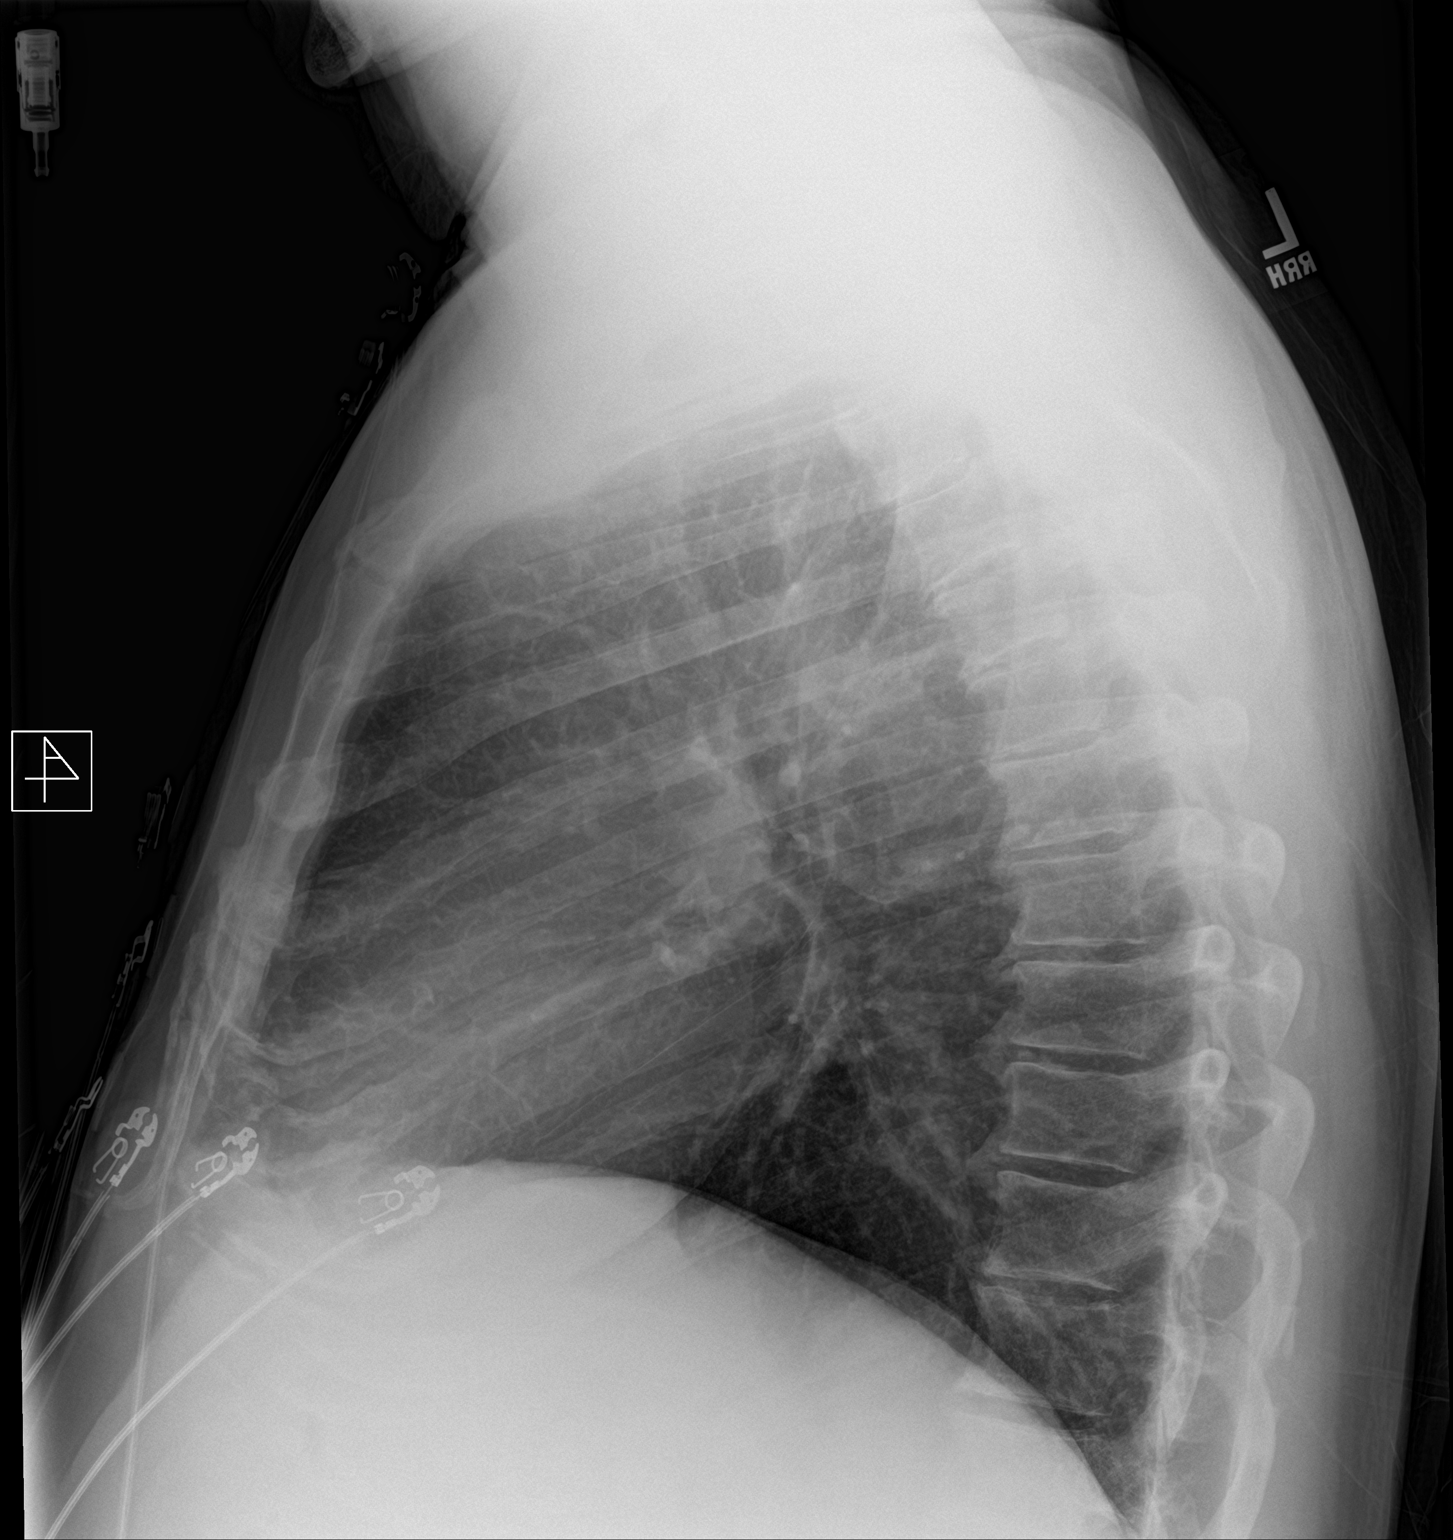

[2 of 2 positions shown; findings below may reference images not displayed]

FINDINGS: The heart size and mediastinal contours are within normal limits.
Both lungs are clear. The visualized skeletal structures are
unremarkable.
IMPRESSION: No active cardiopulmonary disease.

## 2019-09-12 ENCOUNTER — Ambulatory Visit: Payer: Managed Care, Other (non HMO) | Attending: Internal Medicine

## 2019-09-12 DIAGNOSIS — Z23 Encounter for immunization: Secondary | ICD-10-CM | POA: Insufficient documentation

## 2019-09-12 NOTE — Progress Notes (Signed)
   Covid-19 Vaccination Clinic  Name:  Jesse Vargas    MRN: [id number] DOB: Aug 12, 1955  09/12/2019  [name] was observed post Covid-19 immunization for 15 minutes without incident. He was provided with Vaccine Information Sheet and instruction to access the V-Safe system.   [name] was instructed to call 911 with any severe reactions post vaccine: Marland Kitchen Difficulty breathing  . Swelling of face and throat  . A fast heartbeat  . A bad rash all over body  . Dizziness and weakness   Immunizations Administered    Name Date Dose VIS Date Route   Pfizer COVID-19 Vaccine 09/12/2019  9:44 AM 0.3 mL 06/20/2019 Intramuscular   Manufacturer: Winter   Lot: [id number]   [place]: [id number]

## 2019-09-14 NOTE — Progress Notes (Signed)
Cardiology Office Note   Date:  09/15/2019   ID:  Jesse Vargas, DOB 19-Apr-1956, MRN [id number]  PCP:  Jesse Pink, MD  Cardiologist:   Dorris Carnes, MD   No chief complaint on file.  F/U of CAD   History of Present Illness: Jesse Vargas is a 64 y.o. male with a history of mild CAD by cath in 2007. Also a history of HTN, DM , HL, sleep apnea. I saw him in clinic in Oct 2012. Myoview done after seen. This was normal. I saw the patient in clinic in 2016  SInce seen he deneis CP  Breathing is OK  He does a lot of walking during day   No change / decline    He was worrined he had a widow maker   His diabetes was out of control  Much better now   Seeing an endocrinologist  BP is usually well controlled Says he ate a lot of salt today   Usually below 130 /       Current Outpatient Medications  Medication Sig Dispense Refill  . amLODipine (NORVASC) 5 MG tablet Take 0.5 tablets (2.5 mg total) by mouth 2 (two) times daily. *Pt is overdue for an appt and needs to call and schedule for further refills* 30 tablet 0  . aspirin EC 81 MG tablet Take 81 mg by mouth daily.    Marland Kitchen atorvastatin (LIPITOR) 40 MG tablet TAKE 1 TABLET BY MOUTH  DAILY AT 6 PM 30 tablet 0  . carvedilol (COREG) 25 MG tablet TAKE 1 TABLET BY MOUTH 2  TIMES DAILY. 180 tablet 0  . cyclobenzaprine (FLEXERIL) 10 MG tablet Take 1 tablet at bedtime as needed for muscle spasms 30 tablet 5  . Dulaglutide 1.5 MG/0.5ML SOPN Inject into the skin.    . fenofibrate 160 MG tablet Take 1 tablet by mouth  daily 30 tablet 0  . FLUoxetine (PROZAC) 40 MG capsule Take 1 capsule by mouth  daily 30 capsule 0  . fluticasone (FLONASE) 50 MCG/ACT nasal spray Place 2 sprays into both nostrils daily. 48 g 1  . gabapentin (NEURONTIN) 300 MG capsule Take 1 capsule by mouth 3  times daily (Patient taking differently: 2 tablets in the AM and 3 tablets at PM) 90 capsule 0  . glipiZIDE (GLUCOTROL XL) 2.5 MG 24 hr tablet Take 3 tablets by mouth  daily.    Marland Kitchen losartan-hydrochlorothiazide (HYZAAR) 100-25 MG tablet Take 1 tablet by mouth  daily 30 tablet 0  . meloxicam (MOBIC) 15 MG tablet Take 1 tablet by mouth  daily 90 tablet 3  . metFORMIN (GLUCOPHAGE) 1000 MG tablet Take 1500 mg with breakfast and 1000 mg with dinner. Do not exceed 2500 mg per day (Patient taking differently: Take 1,000-1,500 mg by mouth 2 (two) times daily. Take 1000 mg with breakfast and 1500 mg with dinner. Do not exceed 2500 mg per day) 225 tablet 2  . nitroGLYCERIN (NITROLINGUAL) 0.4 MG/SPRAY spray Place 1 spray under the tongue every 5 (five) minutes x 3 doses as needed for chest pain. 12 g 12  . pantoprazole (PROTONIX) 20 MG tablet Take 1 tablet by mouth two  times daily 180 tablet 1  . PROAIR HFA 108 (90 BASE) MCG/ACT inhaler INHALE 2 PUFFS INTO THE  LUNGS 4 TIMES DAILY AS  NEEDED FOR WHEEZING. 17 g 3   No current facility-administered medications for this visit.    Allergies:   Patient has no known allergies.   Past  Medical History:  Diagnosis Date  . CAD (coronary artery disease)    a. LHC (12/19/13):  mid LAD 50-60%, EF 55%, patent CFX and RCA - med Rx.  . Cancer of kidney -status post nephrectomy    Kidney cancer 1999  . Chronic neck pain   . Chronic pain syndrome   . Diabetes mellitus   . Family history of long QT syndrome   . Fibromyalgia   . GERD (gastroesophageal reflux disease)   . Hx of echocardiogram    a.  Echo (12/2013):  mod LVH, EF 55-60%, no RWMA, mild LAE  . Hyperlipidemia   . Hypertension   . Insomnia   . Sleep apnea   . SVT (supraventricular tachycardia) ([place])    Documentation pending but occurring during the stress test with Dr. 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 by Dr Lovena Le    Past Surgical History:  Procedure Laterality Date  . ABLATION  08/27/13   AVNRT ablatin by Dr Lovena Le  . CHOLECYSTECTOMY     2000  . elbow surgery     left  . INNER EAR SURGERY     R ear  . LEFT HEART CATHETERIZATION WITH CORONARY ANGIOGRAM N/A  12/19/2013   Procedure: LEFT HEART CATHETERIZATION WITH CORONARY ANGIOGRAM;  Surgeon: Sinclair Grooms, MD;  Location: [facility] CATH LAB;  Service: Cardiovascular;  Laterality: N/A;  . NASAL SINUS SURGERY     2005  . NEPHRECTOMY     L---1999 Cancer  . SUPRAVENTRICULAR TACHYCARDIA ABLATION N/A 08/27/2013   Procedure: SUPRAVENTRICULAR TACHYCARDIA ABLATION;  Surgeon: Evans Lance, MD;  Location: [facility] CATH LAB;  Service: Cardiovascular;  Laterality: N/A;  . TONSILLECTOMY     08/1988  . UVULOPALATOPHARYNGOPLASTY     08/1988     Social History:  The patient  reports that he quit smoking about 21 years ago. His smoking use included cigarettes. He has a 50.00 pack-year smoking history. He has never used smokeless tobacco. He reports current alcohol use. He reports that he does not use drugs.   Family History:  The patient's family history includes Colon polyps in his father; Diabetes in his father and mother; Hyperlipidemia in his father; Hypertension in his father and mother.    ROS:  Please see the history of present illness. All other systems are reviewed and  Negative to the above problem except as noted.    PHYSICAL EXAM: VS:  BP (!) 164/86   Pulse 77   Ht 5\' 9"  (1.753 m)   Wt 236 lb 9.6 oz (107.3 kg)   BMI 34.94 kg/m   GEN:  [name] 64 yo  in no acute distress HEENT: normal Neck: no JVD, carotid bruits Cardiac: RRR; no murmurs, rubs, or gallops,no edema  Respiratory:  clear to auscultation bilaterally, normal work of breathing GI: soft, nontender, nondistended, + BS  No hepatomegaly  MS: no deformity Moving all extremities   Skin: warm and dry, no rash Neuro:  Strength and sensation are intact Psych: euthymic mood, full affect   EKG:  EKG is ordered today.  SR 77 bpm     Lipid Panel    Component Value Date/Time   CHOL 170 01/04/2015 0000   TRIG 344 (A) 01/04/2015 0000   HDL 28 (A) 01/04/2015 0000   CHOLHDL 6.4 12/19/2013 0400   VLDL UNABLE TO CALCULATE IF TRIGLYCERIDE OVER 400  mg/dL 12/19/2013 0400   LDLCALC 73 01/04/2015 0000   LDLDIRECT 71.5 10/19/2011 1057      Wt Readings from  Last 3 Encounters:  09/15/19 236 lb 9.6 oz (107.3 kg)  03/02/16 138 lb (62.6 kg)  02/18/16 242 lb (109.8 kg)      ASSESSMENT AND PLAN:  1.  CAD  Remote cath   No symptoms to suggest angina   Follow   Control risk factors   2  HTN  BP is high today   He says it is better usually, below 130/     3.  HL Will check lipomed today   His trigs were over 1000 at one point when A1C was high  REpeat lipomed today  4   DM   Pt says last A1C in 6s   Continue   Watch diet  5  Obesity  Watch diet  Cut back   Stay active    Follow up in 1 year   Signed, Dorris Carnes, MD  09/15/2019 3:11 PM    [address], [place], [place]  [address] Phone: [phone]; Fax: [phone]

## 2019-09-15 ENCOUNTER — Encounter: Payer: Self-pay | Admitting: Internal Medicine

## 2019-09-15 ENCOUNTER — Ambulatory Visit: Payer: Managed Care, Other (non HMO) | Admitting: Internal Medicine

## 2019-09-15 ENCOUNTER — Other Ambulatory Visit: Payer: Self-pay

## 2019-09-15 VITALS — BP 164/86 | HR 77 | Ht 69.0 in | Wt 236.6 lb

## 2019-09-15 DIAGNOSIS — E785 Hyperlipidemia, unspecified: Secondary | ICD-10-CM

## 2019-09-15 DIAGNOSIS — I10 Essential (primary) hypertension: Secondary | ICD-10-CM

## 2019-09-15 NOTE — Patient Instructions (Signed)
Medication Instructions:  No changes today *If you need a refill on your cardiac medications before your next appointment, please call your pharmacy*   Lab Work: Today: NMR lipomed profile with lipids  If you have labs (blood work) drawn today and your tests are completely normal, you will receive your results only by: Marland Kitchen MyChart Message (if you have MyChart) OR . A paper copy in the mail If you have any lab test that is abnormal or we need to change your treatment, we will call you to review the results.   Testing/Procedures: None ordered   Follow-Up: At [facility], you and your health needs are our priority.  As part of our continuing mission to provide you with exceptional heart care, we have created designated Provider Care Teams.  These Care Teams include your primary Cardiologist (physician) and Advanced Practice Providers (APPs -  Physician Assistants and Nurse Practitioners) who all work together to provide you with the care you need, when you need it.   Your next appointment:   12 month(s)  The format for your next appointment:   In Person  Provider:   You may see Dorris Carnes, MD or one of the following Advanced Practice Providers on your designated Care Team:    Richardson Dopp, PA-C  [place], [place]  Daune Perch, NP    Other Instructions

## 2019-09-17 LAB — NMR, LIPOPROFILE
Cholesterol, Total: 156 mg/dL (ref 100–199)
HDL Particle Number: 29.3 umol/L — ABNORMAL LOW (ref >=30.5)
HDL-C: 24 mg/dL — ABNORMAL LOW (ref >39)
LDL Particle Number: 1166 nmol/L — ABNORMAL HIGH (ref <1000)
LDL Size: 19.5 nm — ABNORMAL LOW (ref >20.5)
LDL-C (NIH Calc): 63 mg/dL (ref 0–99)
LP-IR Score: 100 — ABNORMAL HIGH (ref <=45)
Small LDL Particle Number: 1021 nmol/L — ABNORMAL HIGH (ref <=527)
Triglycerides: 445 mg/dL — ABNORMAL HIGH (ref 0–149)

## 2019-09-17 LAB — APOLIPOPROTEIN B: Apolipoprotein B: 96 mg/dL — ABNORMAL HIGH (ref <90)

## 2019-09-17 LAB — LIPOPROTEIN A (LPA): Lipoprotein (a): 129.8 nmol/L — ABNORMAL HIGH (ref <75.0)

## 2019-09-22 ENCOUNTER — Telehealth: Payer: Self-pay | Admitting: *Deleted

## 2019-09-22 DIAGNOSIS — E785 Hyperlipidemia, unspecified: Secondary | ICD-10-CM

## 2019-09-22 MED ORDER — ICOSAPENT ETHYL 1 G PO CAPS: 2.0000 g | ORAL_CAPSULE | Freq: Two times a day (BID) | ORAL | 3 refills | Status: DC

## 2019-09-22 MED ORDER — EZETIMIBE 10 MG PO TABS: 10.0000 mg | ORAL_TABLET | Freq: Every day | ORAL | 3 refills | Status: DC

## 2019-09-22 NOTE — Telephone Encounter (Signed)
Spoke with patient and reviewed recommendations from Dr. Harrington Challenger. Pt in agreement with this plan. Requested recommendations be sent to MyChart so he can have for review. Medicines to [facility] as requested.

## 2019-09-22 NOTE — Telephone Encounter (Signed)
-----   Message from Fay Records, MD sent at 09/19/2019  2:38 PM EST ----- Reviewed with lipid clnic   Would add Zetia 10 mg daily and 2 g BID Vascepa  Goal:  Decrease LDL particle number (bad cholesterol) and lower triglycerides

## 2019-09-26 ENCOUNTER — Telehealth: Payer: Self-pay

## 2019-09-26 NOTE — Telephone Encounter (Signed)
I started a Vascepa PA through covermymeds. Key: [id number]

## 2019-10-03 ENCOUNTER — Other Ambulatory Visit: Payer: Self-pay

## 2019-10-03 ENCOUNTER — Ambulatory Visit: Payer: Managed Care, Other (non HMO) | Attending: Internal Medicine

## 2019-10-03 DIAGNOSIS — Z23 Encounter for immunization: Secondary | ICD-10-CM

## 2019-10-03 NOTE — Progress Notes (Signed)
   Covid-19 Vaccination Clinic  Name:  Jesse Vargas    MRN: [id number] DOB: 1956/04/05  10/03/2019  [name] was observed post Covid-19 immunization for 15 minutes without incident. He was provided with Vaccine Information Sheet and instruction to access the V-Safe system.   [name] was instructed to call 911 with any severe reactions post vaccine: Marland Kitchen Difficulty breathing  . Swelling of face and throat  . A fast heartbeat  . A bad rash all over body  . Dizziness and weakness   Immunizations Administered    Name Date Dose VIS Date Route   Pfizer COVID-19 Vaccine 10/03/2019  9:23 AM 0.3 mL 06/20/2019 Intramuscular   Manufacturer: [place]   Lot: [id number]   [place]: [id number]

## 2019-10-30 ENCOUNTER — Other Ambulatory Visit: Payer: Self-pay | Admitting: Family Medicine

## 2019-10-30 DIAGNOSIS — R112 Nausea with vomiting, unspecified: Secondary | ICD-10-CM

## 2019-10-30 DIAGNOSIS — R1900 Intra-abdominal and pelvic swelling, mass and lump, unspecified site: Secondary | ICD-10-CM

## 2019-10-31 ENCOUNTER — Ambulatory Visit
Admission: RE | Admit: 2019-10-31 | Discharge: 2019-10-31 | Disposition: A | Discharge status: Home / Self Care | Payer: Managed Care, Other (non HMO) | Source: Ambulatory Visit | Attending: Family Medicine | Admitting: Family Medicine

## 2019-10-31 ENCOUNTER — Other Ambulatory Visit: Payer: Self-pay

## 2019-10-31 DIAGNOSIS — R1900 Intra-abdominal and pelvic swelling, mass and lump, unspecified site: Secondary | ICD-10-CM | POA: Diagnosis not present

## 2019-10-31 DIAGNOSIS — R112 Nausea with vomiting, unspecified: Secondary | ICD-10-CM | POA: Diagnosis present

## 2019-12-24 ENCOUNTER — Other Ambulatory Visit: Payer: Self-pay

## 2019-12-24 ENCOUNTER — Other Ambulatory Visit: Payer: Managed Care, Other (non HMO) | Admitting: *Deleted

## 2019-12-24 DIAGNOSIS — E785 Hyperlipidemia, unspecified: Secondary | ICD-10-CM

## 2019-12-25 LAB — NMR, LIPOPROFILE
Cholesterol, Total: 146 mg/dL (ref 100–199)
HDL Particle Number: 26.1 umol/L — ABNORMAL LOW (ref >=30.5)
HDL-C: 20 mg/dL — ABNORMAL LOW (ref >39)
LDL Particle Number: 1086 nmol/L — ABNORMAL HIGH (ref <1000)
LDL Size: 19.7 nm — ABNORMAL LOW (ref >20.5)
LDL-C (NIH Calc): 59 mg/dL (ref 0–99)
LP-IR Score: 79 — ABNORMAL HIGH (ref <=45)
Small LDL Particle Number: 904 nmol/L — ABNORMAL HIGH (ref <=527)
Triglycerides: 435 mg/dL — ABNORMAL HIGH (ref 0–149)

## 2019-12-25 LAB — APOLIPOPROTEIN B: Apolipoprotein B: 96 mg/dL — ABNORMAL HIGH (ref <90)

## 2019-12-25 LAB — LIPOPROTEIN A (LPA): Lipoprotein (a): 115.7 nmol/L — ABNORMAL HIGH (ref <75.0)

## 2020-01-02 ENCOUNTER — Telehealth: Payer: Self-pay | Admitting: *Deleted

## 2020-01-02 DIAGNOSIS — E785 Hyperlipidemia, unspecified: Secondary | ICD-10-CM

## 2020-01-02 NOTE — Telephone Encounter (Signed)
Called to inform of results. The patient states he stopped taking zetia and the vascepa because after he started the zetia his stomach was "tore up real bad" and "I had blood in my stools".  His stools were black.  This has cleared up completely.  He is still taking meloxicam.  He will start back on zeta and Vascepa and will return for fasting labs on 03/10/20.  I advised the patient to report the episode of black stools and upset stomach to his primary doctor.  I will forward the lab results with patient's permission.

## 2020-01-02 NOTE — Telephone Encounter (Signed)
-----   Message from Fay Records, MD sent at 12/29/2019 11:29 PM EDT ----- Lipids are still above goal Have reviewed with pharmacy service REcom:  Confirm taking meds as prescribed   If so then would switch to Crestor 40 mg   Stop lipitor  Cotnue other meds  Labs in 8 wks   FASTING.  Cut back on carbs   Check A1C on next check

## 2020-01-27 NOTE — Telephone Encounter (Signed)
**Note De-Identified Jesse Vargas Obfuscation** Covermymeds message: Your request has been approved  Request Reference Number: [id number]. VASCEPA CAP 1GM is approved through 09/25/2020. Your patient may now fill this prescription and it will be covered.

## 2020-08-30 ENCOUNTER — Telehealth: Payer: Self-pay

## 2020-08-30 NOTE — Telephone Encounter (Signed)
PA request approved for Vascepa 1 gm capsules.  PA# [id number]

## 2020-10-03 ENCOUNTER — Other Ambulatory Visit: Payer: Self-pay | Admitting: Internal Medicine

## 2020-10-25 ENCOUNTER — Other Ambulatory Visit: Payer: Self-pay | Admitting: Internal Medicine

## 2020-12-08 ENCOUNTER — Other Ambulatory Visit: Payer: Self-pay | Admitting: *Deleted

## 2020-12-08 MED ORDER — ICOSAPENT ETHYL 1 G PO CAPS: 2.0000 g | ORAL_CAPSULE | Freq: Two times a day (BID) | ORAL | 0 refills | Status: DC

## 2020-12-09 ENCOUNTER — Other Ambulatory Visit: Payer: Self-pay

## 2020-12-09 MED ORDER — ICOSAPENT ETHYL 1 G PO CAPS: 2.0000 g | ORAL_CAPSULE | Freq: Two times a day (BID) | ORAL | 0 refills | Status: DC

## 2020-12-16 NOTE — Progress Notes (Signed)
Cardiology Office Note   Date:  12/17/2020   ID:  Jesse Vargas, DOB 25-Mar-1956, MRN [id number]  PCP:  Maryland Pink, MD  Cardiologist:   Dorris Carnes, MD    F/U of CAD   History of Present Illness: Jesse Vargas is a 65 y.o. male with a history of mild CAD by cath in 2007. Also a history of HTN, DM , HL, sleep apnea. Myoview in 2012 done after seen. This was normal.  Cath in 2015 showed 50 to 60% LAD lesion  Pt also with hx of SVT   s/p ablation in 2015  I saw the pt in 2021  Since the the pt says he feels OK   He denies CP   Breathing is OK  No dizziness  No palpitatoins   The pt says his BP has been in 130s Pt says his sugars are running better    Current Outpatient Medications  Medication Sig Dispense Refill   aspirin EC 81 MG tablet Take 81 mg by mouth daily.     atorvastatin (LIPITOR) 40 MG tablet TAKE 1 TABLET BY MOUTH  DAILY AT 6 PM 30 tablet 0   carvedilol (COREG) 25 MG tablet TAKE 1 TABLET BY MOUTH 2  TIMES DAILY. 180 tablet 0   cyclobenzaprine (FLEXERIL) 10 MG tablet Take 1 tablet at bedtime as needed for muscle spasms 30 tablet 5   Dulaglutide 1.5 MG/0.5ML SOPN Inject into the skin.     ergocalciferol (VITAMIN D2) 1.25 MG (50000 UT) capsule Take 1 capsule by mouth once a week.     ezetimibe (ZETIA) 10 MG tablet TAKE 1 TABLET BY MOUTH  DAILY 30 tablet 0   fenofibrate 160 MG tablet Take 1 tablet by mouth  daily 30 tablet 0   FLUoxetine (PROZAC) 40 MG capsule Take 1 capsule by mouth  daily 30 capsule 0   fluticasone (FLONASE) 50 MCG/ACT nasal spray Place 2 sprays into both nostrils daily. 48 g 1   gabapentin (NEURONTIN) 300 MG capsule Take 1 capsule by mouth 3  times daily 90 capsule 0   glipiZIDE (GLUCOTROL XL) 2.5 MG 24 hr tablet Take 3 tablets by mouth daily.     icosapent Ethyl (VASCEPA) 1 g capsule Take 2 capsules (2 g total) by mouth 2 (two) times daily. 360 capsule 0   losartan-hydrochlorothiazide (HYZAAR) 100-25 MG tablet Take 1 tablet by mouth  daily 30  tablet 0   meloxicam (MOBIC) 15 MG tablet Take 1 tablet by mouth  daily 90 tablet 3   metFORMIN (GLUCOPHAGE) 1000 MG tablet Take 1500 mg with breakfast and 1000 mg with dinner. Do not exceed 2500 mg per day 225 tablet 2   nitroGLYCERIN (NITROLINGUAL) 0.4 MG/SPRAY spray Place 1 spray under the tongue every 5 (five) minutes x 3 doses as needed for chest pain. 12 g 12   pantoprazole (PROTONIX) 20 MG tablet Take 1 tablet by mouth two  times daily 180 tablet 1   PROAIR HFA 108 (90 BASE) MCG/ACT inhaler INHALE 2 PUFFS INTO THE  LUNGS 4 TIMES DAILY AS  NEEDED FOR WHEEZING. 17 g 3   TRULICITY 3 XQ/1.1HE SOPN SMARTSIG:3 Milligram(s) SUB-Q Once a Week     amLODipine (NORVASC) 5 MG tablet Take 1 tablet every am and 1/2 tablet every evening. 135 tablet 3   No current facility-administered medications for this visit.    Allergies:   Patient has no known allergies.   Past Medical History:  Diagnosis Date  CAD (coronary artery disease)    a. LHC (12/19/13):  mid LAD 50-60%, EF 55%, patent CFX and RCA - med Rx.   Cancer of kidney -status post nephrectomy    Kidney cancer 1999   Chronic neck pain    Chronic pain syndrome    Diabetes mellitus    Family history of long QT syndrome    Fibromyalgia    GERD (gastroesophageal reflux disease)    Hx of echocardiogram    a.  Echo (12/2013):  mod LVH, EF 55-60%, no RWMA, mild LAE   Hyperlipidemia    Hypertension    Insomnia    Sleep apnea    SVT (supraventricular tachycardia) ([place])    Documentation pending but occurring during the stress test with Dr. 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 by Dr Lovena Le    Past Surgical History:  Procedure Laterality Date   ABLATION  08/27/13   AVNRT ablatin by Dr Lovena Le   CHOLECYSTECTOMY     2000   elbow surgery     left   INNER EAR SURGERY     R ear   LEFT HEART CATHETERIZATION WITH CORONARY ANGIOGRAM N/A 12/19/2013   Procedure: LEFT HEART CATHETERIZATION WITH CORONARY ANGIOGRAM;  Surgeon: Sinclair Grooms, MD;   Location: [facility] C[facility] LAB;  Service: Cardiovascular;  Laterality: N/A;   NASAL SINUS SURGERY     2005   NEPHRECTOMY     L---1999 Cancer   SUPRAVENTRICULAR TACHYCARDIA ABLATION N/A 08/27/2013   Procedure: SUPRAVENTRICULAR TACHYCARDIA ABLATION;  Surgeon: Evans Lance, MD;  Location: [facility] CATH LAB;  Service: Cardiovascular;  Laterality: N/A;   TONSILLECTOMY     08/1988   UVULOPALATOPHARYNGOPLASTY     08/1988     Social History:  The patient  reports that he quit smoking about 22 years ago. His smoking use included cigarettes. He has a 50.00 pack-year smoking history. He has never used smokeless tobacco. He reports current alcohol use. He reports that he does not use drugs.   Family History:  The patient's family history includes Colon polyps in his father; Diabetes in his father and mother; Hyperlipidemia in his father; Hypertension in his father and mother.    ROS:  Please see the history of present illness. All other systems are reviewed and  Negative to the above problem except as noted.    PHYSICAL EXAM: VS:  BP 138/70   Pulse 70   Ht 5\' 9"  (1.753 m)   Wt 238 lb 6.4 oz (108.1 kg)   SpO2 96%   BMI 35.21 kg/m   GEN  Obese 65 yo  in no acute distress HEENT: normal Neck: no JVD, carotid bruits Cardiac: RRR; no murmurs  Tr LE edema  Respiratory:  clear to auscultation bilaterally GI: soft, nontender, nondistended, + BS  No hepatomegaly  MS: no deformity Moving all extremities   Skin: warm and dry, no rash Neuro:  Strength and sensation are intact Psych: euthymic mood, full affect   EKG:  EKG is ordered today.  SR 70 bpm     Lipid Panel    Component Value Date/Time   CHOL 170 01/04/2015 0000   TRIG 344 (A) 01/04/2015 0000   HDL 28 (A) 01/04/2015 0000   CHOLHDL 6.4 12/19/2013 0400   VLDL UNABLE TO CALCULATE IF TRIGLYCERIDE OVER 400 mg/dL 12/19/2013 0400   LDLCALC 73 01/04/2015 0000   LDLDIRECT 71.5 10/19/2011 1057      Wt Readings from Last 3 Encounters:  12/17/20 238 lb  6.4  oz (108.1 kg)  09/15/19 236 lb 9.6 oz (107.3 kg)  03/02/16 138 lb (62.6 kg)      ASSESSMENT AND PLAN:  1.  CAD  No symptoms of angina   2  HTN  BP could be under tighter control   Will incrase to amlodipine 5 mg am and 2.5 mg pm    3.  HL REpeat lipids today    4   DM   Pt says his glucose is under better control    5  Obesity  Watch diet esp carbs    Follow up  next winter  Signed, Dorris Carnes, MD  12/17/2020 7:48 PM    [address], [place],   [address] Phone: [phone]; Fax: [phone]

## 2020-12-17 ENCOUNTER — Other Ambulatory Visit: Payer: Self-pay

## 2020-12-17 ENCOUNTER — Encounter: Payer: Self-pay | Admitting: Internal Medicine

## 2020-12-17 ENCOUNTER — Ambulatory Visit: Payer: Managed Care, Other (non HMO) | Admitting: Internal Medicine

## 2020-12-17 VITALS — BP 138/70 | HR 70 | Ht 69.0 in | Wt 238.4 lb

## 2020-12-17 DIAGNOSIS — E785 Hyperlipidemia, unspecified: Secondary | ICD-10-CM

## 2020-12-17 DIAGNOSIS — I10 Essential (primary) hypertension: Secondary | ICD-10-CM

## 2020-12-17 MED ORDER — AMLODIPINE BESYLATE 5 MG PO TABS: ORAL_TABLET | ORAL | 3 refills | Status: DC

## 2020-12-17 NOTE — Patient Instructions (Addendum)
Labs today - lipds  Increase amlodipine to 5 mg AM and 2.5 mg at night     Keep on other meds   Follow up in 8 months

## 2020-12-18 LAB — LIPID PANEL
Chol/HDL Ratio: 4.6 ratio (ref 0.0–5.0)
Cholesterol, Total: 116 mg/dL (ref 100–199)
HDL: 25 mg/dL — ABNORMAL LOW (ref >39)
LDL Chol Calc (NIH): 35 mg/dL (ref 0–99)
Triglycerides: 388 mg/dL — ABNORMAL HIGH (ref 0–149)
VLDL Cholesterol Cal: 56 mg/dL — ABNORMAL HIGH (ref 5–40)

## 2020-12-26 ENCOUNTER — Other Ambulatory Visit: Payer: Self-pay | Admitting: Internal Medicine

## 2021-03-11 ENCOUNTER — Other Ambulatory Visit: Payer: Self-pay | Admitting: Internal Medicine

## 2021-05-18 ENCOUNTER — Other Ambulatory Visit (HOSPITAL_BASED_OUTPATIENT_CLINIC_OR_DEPARTMENT_OTHER): Payer: Self-pay | Admitting: Nephrology

## 2021-05-18 ENCOUNTER — Other Ambulatory Visit: Payer: Self-pay | Admitting: Nephrology

## 2021-05-18 DIAGNOSIS — G4733 Obstructive sleep apnea (adult) (pediatric): Secondary | ICD-10-CM

## 2021-05-18 DIAGNOSIS — Z905 Acquired absence of kidney: Secondary | ICD-10-CM

## 2021-05-18 DIAGNOSIS — E785 Hyperlipidemia, unspecified: Secondary | ICD-10-CM

## 2021-05-18 DIAGNOSIS — E1122 Type 2 diabetes mellitus with diabetic chronic kidney disease: Secondary | ICD-10-CM

## 2021-05-18 DIAGNOSIS — R809 Proteinuria, unspecified: Secondary | ICD-10-CM

## 2021-05-18 DIAGNOSIS — I10 Essential (primary) hypertension: Secondary | ICD-10-CM

## 2021-05-30 ENCOUNTER — Ambulatory Visit
Admission: RE | Admit: 2021-05-30 | Discharge: 2021-05-30 | Disposition: A | Discharge status: Home / Self Care | Payer: Managed Care, Other (non HMO) | Source: Ambulatory Visit | Attending: Nephrology | Admitting: Nephrology

## 2021-05-30 DIAGNOSIS — R809 Proteinuria, unspecified: Secondary | ICD-10-CM | POA: Insufficient documentation

## 2021-05-30 DIAGNOSIS — E1122 Type 2 diabetes mellitus with diabetic chronic kidney disease: Secondary | ICD-10-CM | POA: Insufficient documentation

## 2021-05-30 DIAGNOSIS — Z905 Acquired absence of kidney: Secondary | ICD-10-CM | POA: Diagnosis present

## 2021-05-30 DIAGNOSIS — G4733 Obstructive sleep apnea (adult) (pediatric): Secondary | ICD-10-CM | POA: Insufficient documentation

## 2021-05-30 DIAGNOSIS — I10 Essential (primary) hypertension: Secondary | ICD-10-CM | POA: Diagnosis present

## 2021-05-30 DIAGNOSIS — E785 Hyperlipidemia, unspecified: Secondary | ICD-10-CM | POA: Insufficient documentation

## 2021-07-02 IMAGING — US US ABDOMEN COMPLETE
1 series · 13 of 25 positions shown · non-contrast
Comparison: CT 09/27/2009

CLINICAL DATA: Nausea, vomiting.  Pulsatile abdominal mass

EXAM:
ABDOMEN ULTRASOUND COMPLETE

[Series 1: us abdomen complete · 0.33mm/px · 13 of 79 slices shown]
[im 1/79]
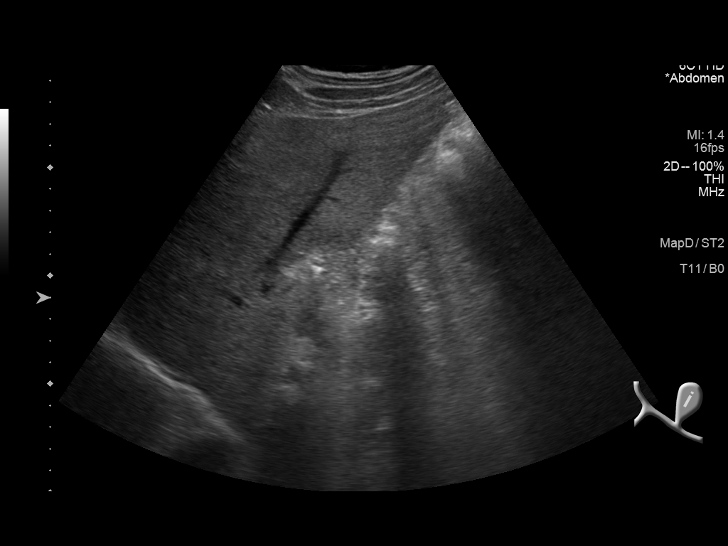
[im 7/79]
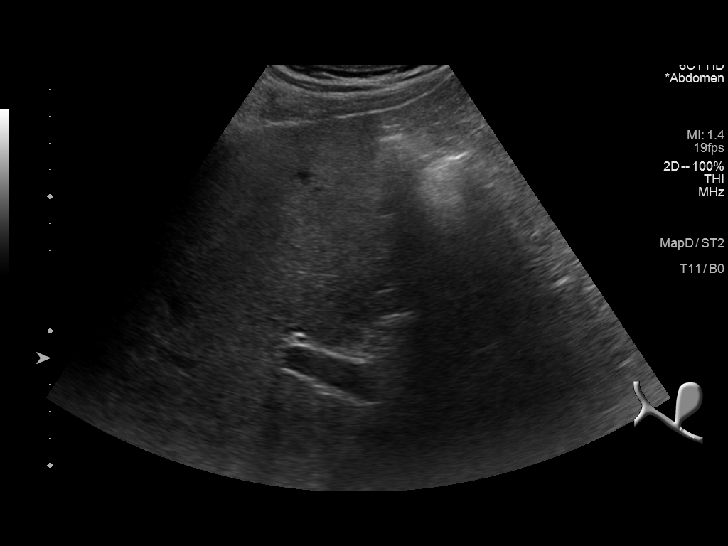
[im 14/79]
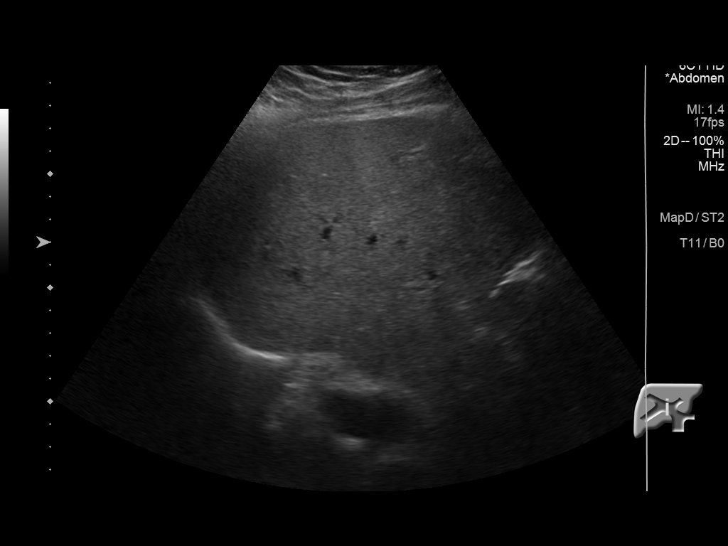
[im 20/79]
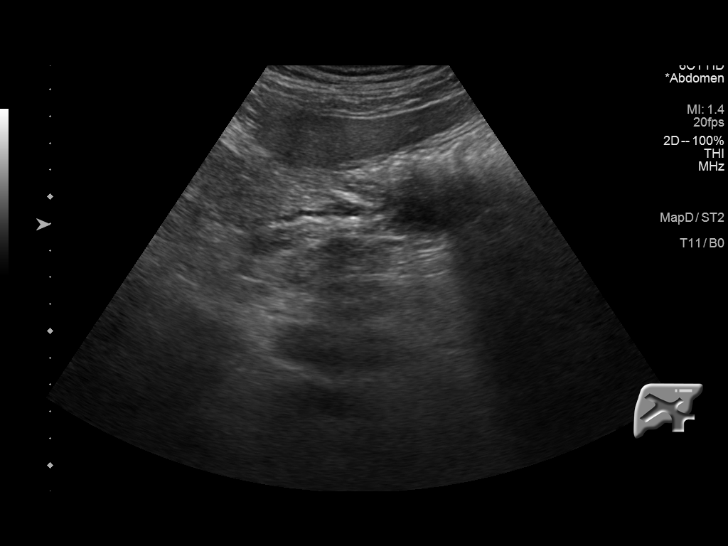
[im 27/79]
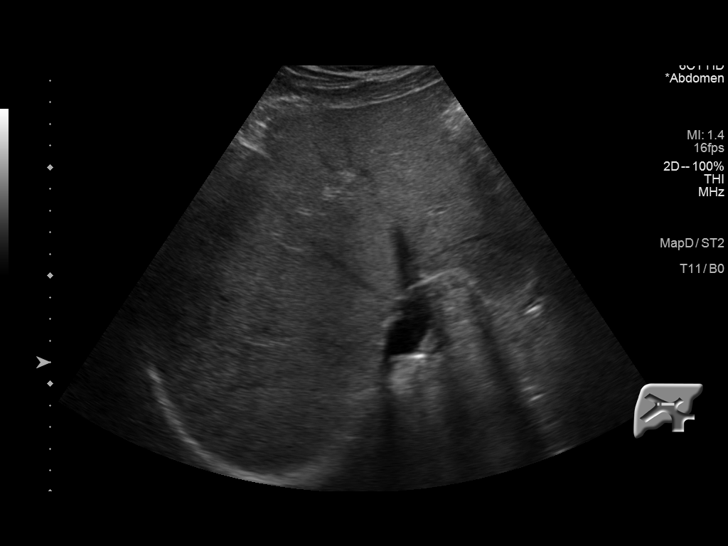
[im 33/79]
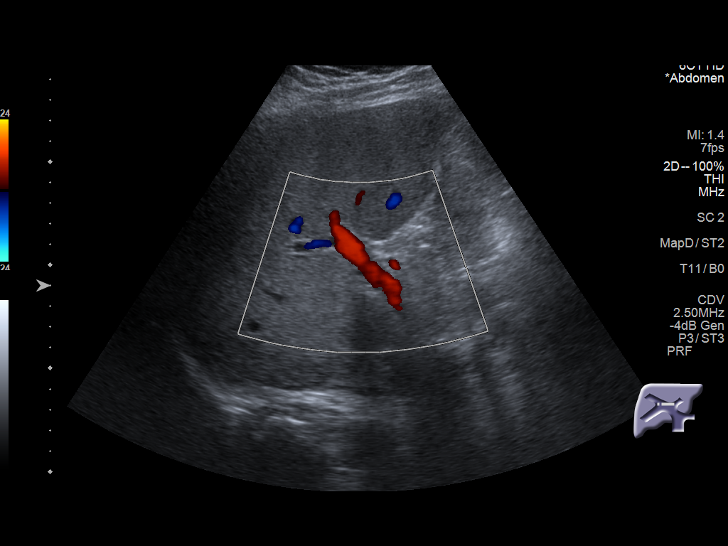
[im 40/79]
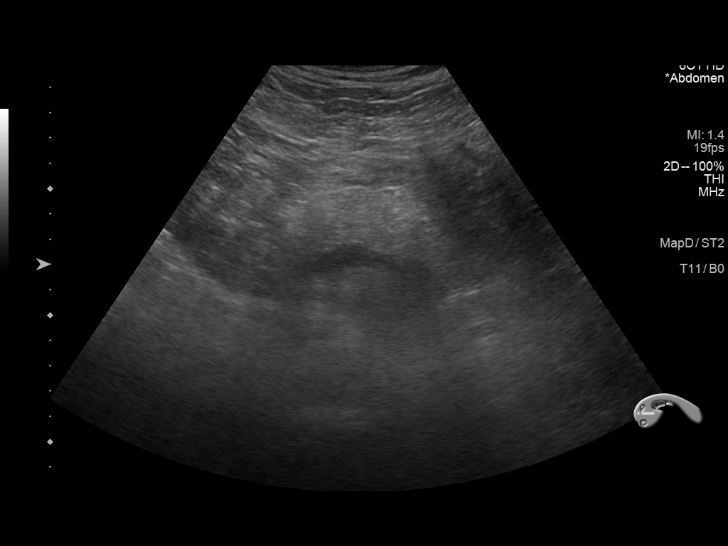
[im 46/79]
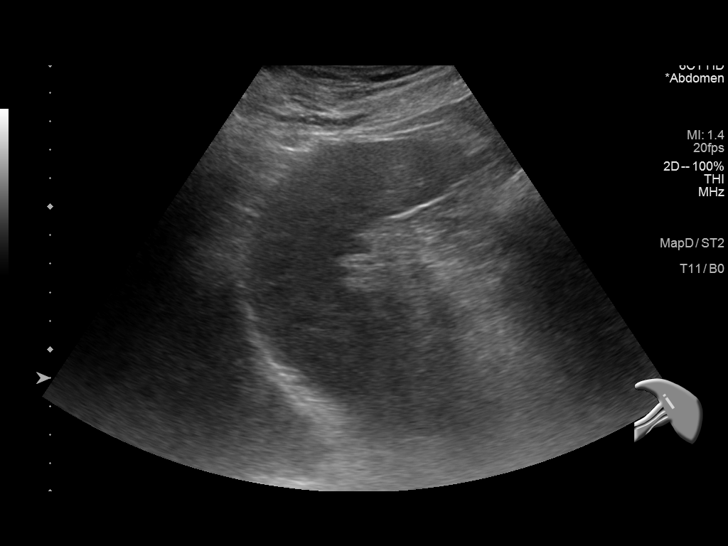
[im 53/79]
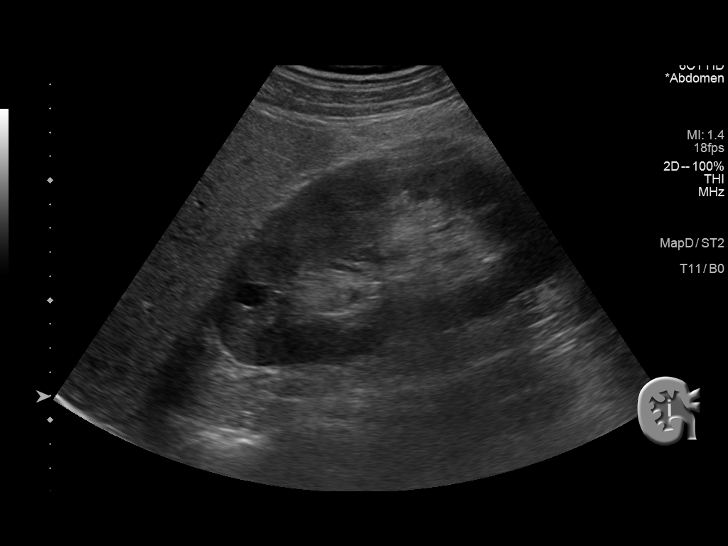
[im 59/79]
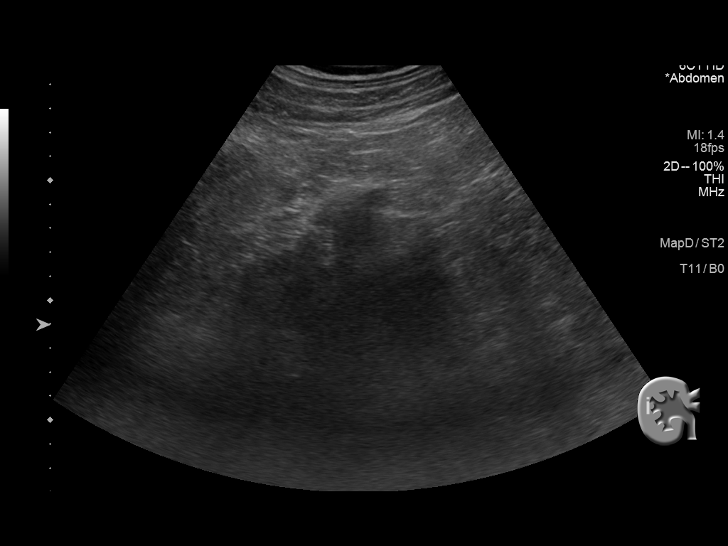
[im 66/79]
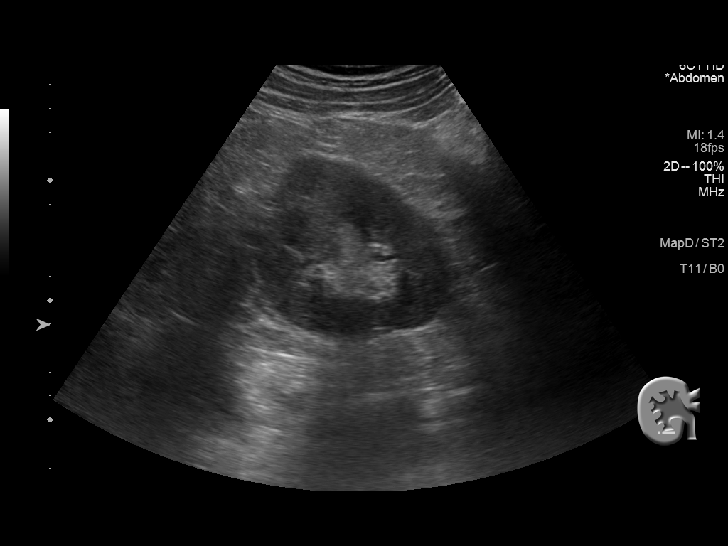
[im 72/79]
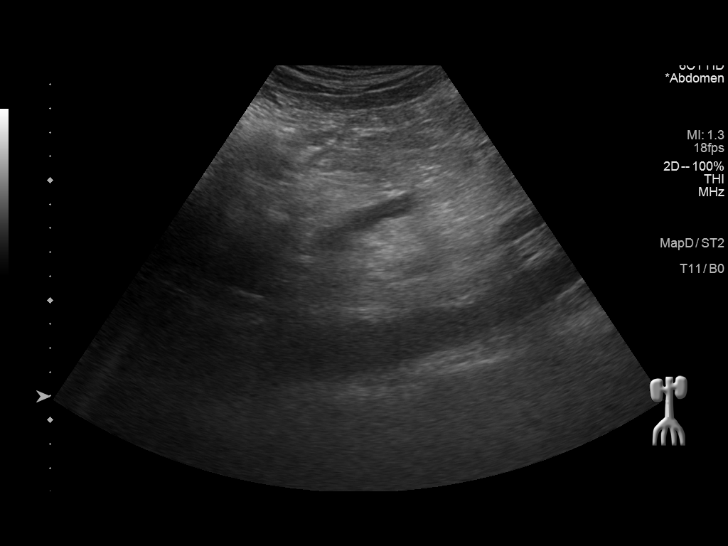
[im 79/79]
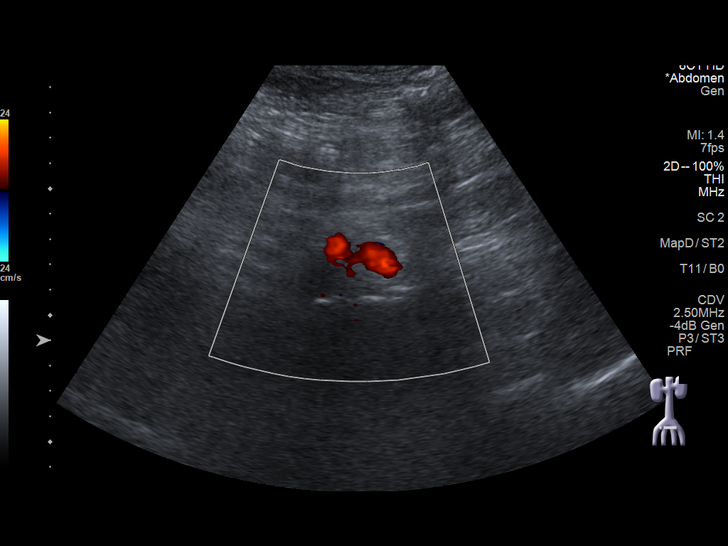

[13 of 25 positions shown; findings below may reference images not displayed]

FINDINGS: Gallbladder: Surgically absent.

Common bile duct: Diameter: 3 mm

Liver: No focal lesion identified. Diffusely increased hepatic
parenchymal echogenicity. Liver measures approximately 20 cm in
length. Portal vein is patent on color Doppler imaging with normal
direction of blood flow towards the liver.

IVC: No abnormality visualized.

Pancreas: Visualized portion unremarkable.

Spleen: Size and appearance within normal limits.

Right Kidney: Length: 15.1 cm. Echogenicity within normal limits.
Simple 1.4 cm upper pole cyst. No solid mass or hydronephrosis
visualized.

Left Kidney: Surgically absent.

Abdominal aorta: Proximal abdominal aorta measures up to 2.8 cm in
diameter.

Other findings: None.
IMPRESSION: 1. Ectatic abdominal aorta at risk for aneurysm development.
Recommend followup by ultrasound in 5 years. This recommendation
follows ACR consensus guidelines: White Paper of the ACR Incidental
Findings Committee II on Vascular Findings. [HOSPITAL] 5934;
[DATE]. Aortic aneurysm NOS ([id number].I)
2. Hepatomegaly.
3. The echogenicity of the liver is increased. This is a nonspecific
finding but is most commonly seen with fatty infiltration of the
liver. There are no obvious focal liver lesions.
4. Surgically absent gallbladder and left kidney.

## 2021-08-05 ENCOUNTER — Telehealth: Payer: Self-pay

## 2021-08-05 NOTE — Telephone Encounter (Signed)
Request Reference Number: [id number]. VASCEPA CAP 1GM is approved through 08/05/2022

## 2021-09-29 ENCOUNTER — Other Ambulatory Visit: Payer: Self-pay | Admitting: Internal Medicine

## 2021-10-06 ENCOUNTER — Other Ambulatory Visit: Payer: Self-pay | Admitting: Internal Medicine

## 2021-10-26 ENCOUNTER — Other Ambulatory Visit: Payer: Self-pay | Admitting: Internal Medicine

## 2021-12-26 NOTE — Progress Notes (Unsigned)
Cardiology Office Note   Date:  12/28/2021   ID:  Jesse, Vargas 1956/02/13, MRN [id number]  PCP:  Maryland Pink, MD  Cardiologist:   Dorris Carnes, MD    F/U of CAD    History of Present Illness: Jesse Vargas is a 66 y.o. male with a history of mild CAD by cath in 2007. Also a history of HTN, DM , HL, sleep apnea. Myoview in 2012 done after seen. This was normal.  Cath in 2015 showed 50 to 60% LAD lesion  Pt also with hx of SVT and is s/p ablation in 2015   I saw the pt in June 2022  Since seen he denies CP   Breathing is OK   Rare palpitations  No dizziness Using CPAP   Tolerating well.   Diet:   Breakfast   coffee with cream; sausage roll up.   Lunch   hamburger or hot dog Or salad or gyro [employer], vegg  Current Outpatient Medications  Medication Sig Dispense Refill   aspirin EC 81 MG tablet Take 81 mg by mouth daily.     atorvastatin (LIPITOR) 40 MG tablet TAKE 1 TABLET BY MOUTH  DAILY AT 6 PM 30 tablet 0   carvedilol (COREG) 25 MG tablet TAKE 1 TABLET BY MOUTH 2  TIMES DAILY. 180 tablet 0   cyclobenzaprine (FLEXERIL) 10 MG tablet Take 1 tablet at bedtime as needed for muscle spasms 30 tablet 5   Dulaglutide 1.5 MG/0.5ML SOPN Inject into the skin.     ergocalciferol (VITAMIN D2) 1.25 MG (50000 UT) capsule Take 1 capsule by mouth once a week.     ezetimibe (ZETIA) 10 MG tablet TAKE 1 TABLET BY MOUTH  DAILY 90 tablet 0   fenofibrate 160 MG tablet Take 1 tablet by mouth  daily 30 tablet 0   FLUoxetine (PROZAC) 40 MG capsule Take 1 capsule by mouth  daily 30 capsule 0   fluticasone (FLONASE) 50 MCG/ACT nasal spray Place 2 sprays into both nostrils daily. 48 g 1   gabapentin (NEURONTIN) 300 MG capsule Take 1 capsule by mouth 3  times daily 90 capsule 0   glipiZIDE (GLUCOTROL XL) 2.5 MG 24 hr tablet Take 3 tablets by mouth daily.     icosapent Ethyl (VASCEPA) 1 g capsule TAKE 2 CAPSULES(2 GRAMS) BY MOUTH TWICE DAILY 360 capsule 2   losartan-hydrochlorothiazide  (HYZAAR) 100-25 MG tablet Take 1 tablet by mouth  daily 30 tablet 0   meloxicam (MOBIC) 15 MG tablet Take 1 tablet by mouth  daily 90 tablet 3   metFORMIN (GLUCOPHAGE) 1000 MG tablet Take 1500 mg with breakfast and 1000 mg with dinner. Do not exceed 2500 mg per day 225 tablet 2   nitroGLYCERIN (NITROLINGUAL) 0.4 MG/SPRAY spray Place 1 spray under the tongue every 5 (five) minutes x 3 doses as needed for chest pain. 12 g 12   pantoprazole (PROTONIX) 20 MG tablet Take 1 tablet by mouth two  times daily 180 tablet 1   PROAIR HFA 108 (90 BASE) MCG/ACT inhaler INHALE 2 PUFFS INTO THE  LUNGS 4 TIMES DAILY AS  NEEDED FOR WHEEZING. 17 g 3   TRULICITY 3 GH/8.2XH SOPN SMARTSIG:3 Milligram(s) SUB-Q Once a Week     amLODipine (NORVASC) 5 MG tablet Take 1 tablet (5 mg total) by mouth in the morning and at bedtime. TAKE 1 TABLET BY MOUTH IN  THE MORNING AND  IN THE  EVENING. 180 tablet 1  No current facility-administered medications for this visit.    Allergies:   Patient has no known allergies.   Past Medical History:  Diagnosis Date   CAD (coronary artery disease)    a. LHC (12/19/13):  mid LAD 50-60%, EF 55%, patent CFX and RCA - med Rx.   Cancer of kidney -status post nephrectomy    Kidney cancer 1999   Chronic neck pain    Chronic pain syndrome    Diabetes mellitus    Family history of long QT syndrome    Fibromyalgia    GERD (gastroesophageal reflux disease)    Hx of echocardiogram    a.  Echo (12/2013):  mod LVH, EF 55-60%, no RWMA, mild LAE   Hyperlipidemia    Hypertension    Insomnia    Sleep apnea    SVT (supraventricular tachycardia) ([place])    Documentation pending but occurring during the stress test with Dr. 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 by Dr Lovena Le    Past Surgical History:  Procedure Laterality Date   ABLATION  08/27/13   AVNRT ablatin by Dr Lovena Le   CHOLECYSTECTOMY     2000   elbow surgery     left   INNER EAR SURGERY     R ear   LEFT HEART CATHETERIZATION WITH  CORONARY ANGIOGRAM N/A 12/19/2013   Procedure: LEFT HEART CATHETERIZATION WITH CORONARY ANGIOGRAM;  Surgeon: Sinclair Grooms, MD;  Location[facility] C[facility] LAB;  Service: Cardiovascular;  Laterality: N/A;   NASAL SINUS SURGERY     2005   NEPHRECTOMY     L---1999 Cancer   SUPRAVENTRICULAR TACHYCARDIA ABLATION N/A 08/27/2013   Procedure: SUPRAVENTRICULAR TACHYCARDIA ABLATION;  Surgeon: Evans Lance, MD;  Location: [facility] CATH LAB;  Service: Cardiovascular;  Laterality: N/A;   TONSILLECTOMY     08/1988   UVULOPALATOPHARYNGOPLASTY     08/1988     Social History:  The patient  reports that he quit smoking about 23 years ago. His smoking use included cigarettes. He has a 50.00 pack-year smoking history. He has never used smokeless tobacco. He reports current alcohol use. He reports that he does not use drugs.   Family History:  The patient's family history includes Colon polyps in his father; Diabetes in his father and mother; Hyperlipidemia in his father; Hypertension in his father and mother.    ROS:  Please see the history of present illness. All other systems are reviewed and  Negative to the above problem except as noted.    PHYSICAL EXAM: VS:  BP (!) 152/76   Pulse 95   Ht '5\' 9"'$  (1.753 m)   Wt 237 lb (107.5 kg)   SpO2 95%   BMI 35.00 kg/m   GEN  Obese 66 yo  in no acute distress HEENT: normal Neck: no JVD, carotid bruits Cardiac: RRR; II/VI systolic murmur LSB    Trivial LE  edema  Respiratory:  clear to auscultation bilaterally GI: soft, nontender, nondistended, + BS  No hepatomegaly  MS: no deformity Moving all extremities   Skin: warm and dry, no rash Neuro:  Strength and sensation are intact Psych: euthymic mood, full affect   EKG:  EKG is ordered today.  SR 63 bpm     Lipid Panel    Component Value Date/Time   CHOL 116 12/17/2020 1651   TRIG 388 (H) 12/17/2020 1651   HDL 25 (L) 12/17/2020 1651   CHOLHDL 4.6 12/17/2020 1651   CHOLHDL 6.4 12/19/2013 0400   VLDL UNABLE  TO  CALCULATE IF TRIGLYCERIDE OVER 400 mg/dL 12/19/2013 0400   LDLCALC 35 12/17/2020 1651   LDLDIRECT 71.5 10/19/2011 1057      Wt Readings from Last 3 Encounters:  12/28/21 237 lb (107.5 kg)  12/17/20 238 lb 6.4 oz (108.1 kg)  09/15/19 236 lb 9.6 oz (107.3 kg)      ASSESSMENT AND PLAN:  1.  CAD    Pt feels OK  No CP    2  HTN  BP is still high   Will increase amlodipine to 5 bid     3.  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

## 2021-12-28 ENCOUNTER — Encounter: Payer: Self-pay | Admitting: Internal Medicine

## 2021-12-28 ENCOUNTER — Ambulatory Visit (INDEPENDENT_AMBULATORY_CARE_PROVIDER_SITE_OTHER): Payer: Managed Care, Other (non HMO) | Admitting: Internal Medicine

## 2021-12-28 VITALS — BP 152/76 | HR 95 | Ht 69.0 in | Wt 237.0 lb

## 2021-12-28 DIAGNOSIS — E785 Hyperlipidemia, unspecified: Secondary | ICD-10-CM | POA: Diagnosis not present

## 2021-12-28 DIAGNOSIS — Z79899 Other long term (current) drug therapy: Secondary | ICD-10-CM | POA: Diagnosis not present

## 2021-12-28 DIAGNOSIS — R011 Cardiac murmur, unspecified: Secondary | ICD-10-CM

## 2021-12-28 DIAGNOSIS — I471 Supraventricular tachycardia: Secondary | ICD-10-CM

## 2021-12-28 DIAGNOSIS — I10 Essential (primary) hypertension: Secondary | ICD-10-CM | POA: Diagnosis not present

## 2021-12-28 MED ORDER — AMLODIPINE BESYLATE 5 MG PO TABS: 5.0000 mg | ORAL_TABLET | Freq: Two times a day (BID) | ORAL | 1 refills | Status: DC

## 2021-12-28 NOTE — Patient Instructions (Signed)
Medication Instructions:    START TAKING:  AMLODIPINE 5 MG TWICE A DAY   *If you need a refill on your cardiac medications before your next appointment, please call your pharmacy*   Lab Work: BMET A1C LIPIDS CBC AND TSH TODAY    If you have labs (blood work) drawn today and your tests are completely normal, you will receive your results only by: [place] (if you have MyChart) OR A paper copy in the mail If you have any lab test that is abnormal or we need to change your treatment, we will call you to review the results.   Testing/Procedures:Your physician has requested that you have an echocardiogram. Echocardiography is a painless test that uses sound waves to create images of your heart. It provides your doctor with information about the size and shape of your heart and how well your heart's chambers and valves are working. This procedure takes approximately one hour. There are no restrictions for this procedure.    Follow-Up: At [facility], you and your health needs are our priority.  As part of our continuing mission to provide you with exceptional heart care, we have created designated Provider Care Teams.  These Care Teams include your primary Cardiologist (physician) and Advanced Practice Providers (APPs -  Physician Assistants and Nurse Practitioners) who all work together to provide you with the care you need, when you need it.  We recommend signing up for the patient portal called "MyChart".  Sign up information is provided on this After Visit Summary.  MyChart is used to connect with patients for Virtual Visits (Telemedicine).  Patients are able to view lab/test results, encounter notes, upcoming appointments, etc.  Non-urgent messages can be sent to your provider as well.   To learn more about what you can do with MyChart, go to [email or web address].    Your next appointment:   4 month(s)  The format for your next appointment:   In Person  Provider:   Dorris Carnes, MD {      Other Instructions   Important Information About Sugar

## 2021-12-29 LAB — CBC
Hematocrit: 40.4 % (ref 37.5–51.0)
Hemoglobin: 14.2 g/dL (ref 13.0–17.7)
MCH: 31.2 pg (ref 26.6–33.0)
MCHC: 35.1 g/dL (ref 31.5–35.7)
MCV: 89 fL (ref 79–97)
Platelets: 192 10*3/uL (ref 150–450)
RBC: 4.55 x10E6/uL (ref 4.14–5.80)
RDW: 13 % (ref 11.6–15.4)
WBC: 8.3 10*3/uL (ref 3.4–10.8)

## 2021-12-29 LAB — BASIC METABOLIC PANEL
BUN/Creatinine Ratio: 21 (ref 10–24)
BUN: 24 mg/dL (ref 8–27)
CO2: 21 mmol/L (ref 20–29)
Calcium: 9.7 mg/dL (ref 8.6–10.2)
Chloride: 100 mmol/L (ref 96–106)
Creatinine, Ser: 1.12 mg/dL (ref 0.76–1.27)
Glucose: 198 mg/dL — ABNORMAL HIGH (ref 70–99)
Potassium: 3.7 mmol/L (ref 3.5–5.2)
Sodium: 141 mmol/L (ref 134–144)
eGFR: 73 mL/min/{1.73_m2} (ref >59)

## 2021-12-29 LAB — LIPID PANEL
Chol/HDL Ratio: 5.6 ratio — ABNORMAL HIGH (ref 0.0–5.0)
Cholesterol, Total: 118 mg/dL (ref 100–199)
HDL: 21 mg/dL — ABNORMAL LOW (ref >39)
LDL Chol Calc (NIH): 21 mg/dL (ref 0–99)
Triglycerides: 570 mg/dL (ref 0–149)
VLDL Cholesterol Cal: 76 mg/dL — ABNORMAL HIGH (ref 5–40)

## 2021-12-29 LAB — TSH: TSH: 1.56 u[IU]/mL (ref 0.450–4.500)

## 2021-12-29 LAB — HEMOGLOBIN A1C
Est. average glucose Bld gHb Est-mCnc: 180 mg/dL
Hgb A1c MFr Bld: 7.9 % — ABNORMAL HIGH (ref 4.8–5.6)

## 2022-01-12 ENCOUNTER — Ambulatory Visit (HOSPITAL_COMMUNITY): Payer: Managed Care, Other (non HMO) | Attending: Cardiology

## 2022-01-12 DIAGNOSIS — Z79899 Other long term (current) drug therapy: Secondary | ICD-10-CM | POA: Insufficient documentation

## 2022-01-12 DIAGNOSIS — R011 Cardiac murmur, unspecified: Secondary | ICD-10-CM | POA: Diagnosis not present

## 2022-01-12 DIAGNOSIS — I471 Supraventricular tachycardia: Secondary | ICD-10-CM | POA: Insufficient documentation

## 2022-01-12 DIAGNOSIS — E785 Hyperlipidemia, unspecified: Secondary | ICD-10-CM | POA: Diagnosis not present

## 2022-01-12 DIAGNOSIS — E119 Type 2 diabetes mellitus without complications: Secondary | ICD-10-CM | POA: Insufficient documentation

## 2022-01-12 DIAGNOSIS — I10 Essential (primary) hypertension: Secondary | ICD-10-CM | POA: Diagnosis not present

## 2022-01-12 DIAGNOSIS — I2510 Atherosclerotic heart disease of native coronary artery without angina pectoris: Secondary | ICD-10-CM | POA: Insufficient documentation

## 2022-01-12 DIAGNOSIS — R002 Palpitations: Secondary | ICD-10-CM | POA: Diagnosis not present

## 2022-01-12 DIAGNOSIS — Z8249 Family history of ischemic heart disease and other diseases of the circulatory system: Secondary | ICD-10-CM | POA: Diagnosis not present

## 2022-01-12 DIAGNOSIS — G4730 Sleep apnea, unspecified: Secondary | ICD-10-CM | POA: Insufficient documentation

## 2022-01-12 LAB — ECHOCARDIOGRAM COMPLETE
AR max vel: 4.95 cm2
AV Area VTI: 5.07 cm2
AV Area mean vel: 5.07 cm2
AV Mean grad: 7.8 mmHg
AV Peak grad: 15.9 mmHg
Ao pk vel: 2 m/s
Area-P 1/2: 2.99 cm2
S' Lateral: 2.5 cm

## 2022-01-16 ENCOUNTER — Other Ambulatory Visit: Payer: Self-pay

## 2022-01-16 MED ORDER — ICOSAPENT ETHYL 1 G PO CAPS: ORAL_CAPSULE | ORAL | 3 refills | Status: DC

## 2022-03-14 ENCOUNTER — Telehealth: Payer: Self-pay | Admitting: Internal Medicine

## 2022-03-14 NOTE — Progress Notes (Deleted)
Office Visit    Patient Name: JOHNNEY SCARLATA Date of Encounter: 03/14/2022  Primary Care Provider:  Maryland Pink, MD Primary Cardiologist:  Dorris Carnes, MD Primary Electrophysiologist: None  Chief Complaint    Jesse Vargas is a 66 y.o. male with PMH of ***  Past Medical History    Past Medical History:  Diagnosis Date   CAD (coronary artery disease)    a. LHC (12/19/13):  mid LAD 50-60%, EF 55%, patent CFX and RCA - med Rx.   Cancer of kidney -status post nephrectomy    Kidney cancer 1999   Chronic neck pain    Chronic pain syndrome    Diabetes mellitus    Family history of long QT syndrome    Fibromyalgia    GERD (gastroesophageal reflux disease)    Hx of echocardiogram    a.  Echo (12/2013):  mod LVH, EF 55-60%, no RWMA, mild LAE   Hyperlipidemia    Hypertension    Insomnia    Sleep apnea    SVT (supraventricular tachycardia) ([place])    Documentation pending but occurring during the stress test with Dr. 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 by Dr Lovena Le   Past Surgical History:  Procedure Laterality Date   ABLATION  08/27/13   AVNRT ablatin by Dr Lovena Le   CHOLECYSTECTOMY     2000   elbow surgery     left   INNER EAR SURGERY     R ear   LEFT HEART CATHETERIZATION WITH CORONARY ANGIOGRAM N/A 12/19/2013   Procedure: LEFT HEART CATHETERIZATION WITH CORONARY ANGIOGRAM;  Surgeon: Sinclair Grooms, MD;  Location: [facility] CATH LAB;  Service: Cardiovascular;  Laterality: N/A;   NASAL SINUS SURGERY     2005   NEPHRECTOMY     L---1999 Cancer   SUPRAVENTRICULAR TACHYCARDIA ABLATION N/A 08/27/2013   Procedure: SUPRAVENTRICULAR TACHYCARDIA ABLATION;  Surgeon: Evans Lance, MD;  Location: [facility] CATH LAB;  Service: Cardiovascular;  Laterality: N/A;   TONSILLECTOMY     08/1988   UVULOPALATOPHARYNGOPLASTY     08/1988    Allergies  No Known Allergies  History of Present Illness    Jesse Vargas has a PMH of       Since last being seen in the office patient  reports***.  Patient denies chest pain, palpitations, dyspnea, PND, orthopnea, nausea, vomiting, dizziness, syncope, edema, weight gain, or early satiety.     ***Notes:  Home Medications    Current Outpatient Medications  Medication Sig Dispense Refill   amLODipine (NORVASC) 5 MG tablet Take 1 tablet (5 mg total) by mouth in the morning and at bedtime. TAKE 1 TABLET BY MOUTH IN  THE MORNING AND  IN THE  EVENING. 180 tablet 1   aspirin EC 81 MG tablet Take 81 mg by mouth daily.     atorvastatin (LIPITOR) 40 MG tablet TAKE 1 TABLET BY MOUTH  DAILY AT 6 PM 30 tablet 0   carvedilol (COREG) 25 MG tablet TAKE 1 TABLET BY MOUTH 2  TIMES DAILY. 180 tablet 0   cyclobenzaprine (FLEXERIL) 10 MG tablet Take 1 tablet at bedtime as needed for muscle spasms 30 tablet 5   Dulaglutide 1.5 MG/0.5ML SOPN Inject into the skin.     ergocalciferol (VITAMIN D2) 1.25 MG (50000 UT) capsule Take 1 capsule by mouth once a week.     ezetimibe (ZETIA) 10 MG tablet TAKE 1 TABLET BY MOUTH  DAILY 90 tablet 0   fenofibrate 160 MG  tablet Take 1 tablet by mouth  daily 30 tablet 0   FLUoxetine (PROZAC) 40 MG capsule Take 1 capsule by mouth  daily 30 capsule 0   fluticasone (FLONASE) 50 MCG/ACT nasal spray Place 2 sprays into both nostrils daily. 48 g 1   gabapentin (NEURONTIN) 300 MG capsule Take 1 capsule by mouth 3  times daily 90 capsule 0   glipiZIDE (GLUCOTROL XL) 2.5 MG 24 hr tablet Take 3 tablets by mouth daily.     icosapent Ethyl (VASCEPA) 1 g capsule TAKE 2 CAPSULES(2 GRAMS) BY MOUTH TWICE DAILY 360 capsule 3   losartan-hydrochlorothiazide (HYZAAR) 100-25 MG tablet Take 1 tablet by mouth  daily 30 tablet 0   meloxicam (MOBIC) 15 MG tablet Take 1 tablet by mouth  daily 90 tablet 3   metFORMIN (GLUCOPHAGE) 1000 MG tablet Take 1500 mg with breakfast and 1000 mg with dinner. Do not exceed 2500 mg per day 225 tablet 2   nitroGLYCERIN (NITROLINGUAL) 0.4 MG/SPRAY spray Place 1 spray under the tongue every 5 (five)  minutes x 3 doses as needed for chest pain. 12 g 12   pantoprazole (PROTONIX) 20 MG tablet Take 1 tablet by mouth two  times daily 180 tablet 1   PROAIR HFA 108 (90 BASE) MCG/ACT inhaler INHALE 2 PUFFS INTO THE  LUNGS 4 TIMES DAILY AS  NEEDED FOR WHEEZING. 17 g 3   TRULICITY 3 TM/2.2QJ SOPN SMARTSIG:3 Milligram(s) SUB-Q Once a Week     No current facility-administered medications for this visit.     Review of Systems  Please see the history of present illness.    (+)*** (+)***  All other systems reviewed and are otherwise negative except as noted above.  Physical Exam    Wt Readings from Last 3 Encounters:  12/28/21 237 lb (107.5 kg)  12/17/20 238 lb 6.4 oz (108.1 kg)  09/15/19 236 lb 9.6 oz (107.3 kg)   FH:LKTGY were no vitals filed for this visit.,There is no height or weight on file to calculate BMI.  Constitutional:      Appearance: Healthy appearance. Not in distress.  Neck:     Vascular: JVD normal.  Pulmonary:     Effort: Pulmonary effort is normal.     Breath sounds: No wheezing. No rales. Diminished in the bases Cardiovascular:     Normal rate. Regular rhythm. Normal S1. Normal S2.      Murmurs: There is no murmur.  Edema:    Peripheral edema absent.  Abdominal:     Palpations: Abdomen is soft non tender. There is no hepatomegaly.  Skin:    General: Skin is warm and dry.  Neurological:     General: No focal deficit present.     Mental Status: Alert and oriented to person, place and time.     Cranial Nerves: Cranial nerves are intact.  EKG/LABS/Other Studies Reviewed    ECG personally reviewed by me today - ***  Risk Assessment/Calculations:   {Does this patient have ATRIAL FIBRILLATION?:[phone]}        Lab Results  Component Value Date   WBC 8.3 12/28/2021   HGB 14.2 12/28/2021   HCT 40.4 12/28/2021   MCV 89 12/28/2021   PLT 192 12/28/2021   Lab Results  Component Value Date   CREATININE 1.12 12/28/2021   BUN 24 12/28/2021   NA 141  12/28/2021   K 3.7 12/28/2021   CL 100 12/28/2021   CO2 21 12/28/2021   Lab Results  Component Value Date  ALT 26 01/04/2015   AST 20 01/04/2015   ALKPHOS 42 01/04/2015   BILITOT 0.3 10/19/2011   Lab Results  Component Value Date   CHOL 118 12/28/2021   HDL 21 (L) 12/28/2021   LDLCALC 21 12/28/2021   LDLDIRECT 71.5 10/19/2011   TRIG 570 (HH) 12/28/2021   CHOLHDL 5.6 (H) 12/28/2021    Lab Results  Component Value Date   HGBA1C 7.9 (H) 12/28/2021    Assessment & Plan    1.***  2.***  3.***  4.***      Disposition: Follow-up with Dorris Carnes, MD or APP in *** months {Are you ordering a CV Procedure (e.g. stress test, cath, DCCV, TEE, etc)?   Press F2        :[id number]}   Medication Adjustments/Labs and Tests Ordered: Current medicines are reviewed at length with the patient today.  Concerns regarding medicines are outlined above.   Signed, Mable Fill, Marissa Nestle, NP 03/14/2022, 7:35 PM [place]

## 2022-03-14 NOTE — Telephone Encounter (Addendum)
Pt says his Apple Watch reported AFIB last night for about 3 hours rate 120 after eating spaghetti and drinking beer... he did not feel bad but felt his heart beating fast and irregular.. his watch is preprogrammed to record it... he has been well ever since except for some added fatigue with exertion.   He said the Afib improved after "chewing up" his Xanax.   He says he had the same episode back in June but did not let us know... his watch has not alerted him to any other events until last night.   I offered him an appt for tomorrow but he declined due to his work but made him an appt with Ambrose Pancoast PA for Thursday 03/16/22. He will continue to monitor.   His BP has been staying between 117/70 - 120/70.   He will try and send is the strips via My Chart for Dr Harrington Challenger to review.   I will forward his message to her for review and any other recommendations.

## 2022-03-14 NOTE — Telephone Encounter (Signed)
Agree with plan.   Bring in recordings of readings to visit

## 2022-03-14 NOTE — Telephone Encounter (Signed)
Patient c/o Palpitations:  High priority if patient c/o lightheadedness, shortness of breath, or chest pain  How long have you had palpitations/irregular HR/ Afib? Are you having the symptoms now?  No  Are you currently experiencing lightheadedness, SOB or CP?   No  Do you have a history of afib (atrial fibrillation) or irregular heart rhythm?   No  Have you checked your BP or HR? (document readings if available):           BP117/70 to 120/70  Are you experiencing any other symptoms?  Patient stated he had a really bad headache  Patient called stating he was in afib yesterday from 4-7:40 pm.  Patient stated he took a Xanax and about 45 minutes later he came out of afib.  Patient stated he had spaghetti and beer and thought this may have contributed to these symptoms.  Patient wanted to know what are his options going forward.

## 2022-03-15 NOTE — Telephone Encounter (Signed)
I called to follow up with the pt and he reports no new events since I spoke with him yesterday... he will bring all of his readings with him to his appt... he will call back today if anything changes/ return of symptoms.

## 2022-03-16 ENCOUNTER — Ambulatory Visit: Payer: Managed Care, Other (non HMO) | Admitting: Nurse Practitioner

## 2022-03-16 ENCOUNTER — Other Ambulatory Visit: Payer: Self-pay | Admitting: Internal Medicine

## 2022-03-20 ENCOUNTER — Telehealth: Payer: Self-pay | Admitting: Internal Medicine

## 2022-03-20 ENCOUNTER — Ambulatory Visit: Payer: Managed Care, Other (non HMO) | Admitting: Internal Medicine

## 2022-03-20 NOTE — Telephone Encounter (Addendum)
I spoke with the pt and he reports that after eating breakfast yesterday he felt like his heart was out of rhythm.. his watch said Afib... he felt okay otherwise... no dizziness/ presyncope... his HR at one time was 108 but then stayed in the 60's.   He had an appt with an APP 03/16/22 but he cancelled... he will see Ambrose Pancoast PA 03/21/22 and will bring his readings from his watch with him.   Pt declined coming in today due to work.

## 2022-03-20 NOTE — Telephone Encounter (Signed)
Patient c/o Palpitations:  High priority if patient c/o lightheadedness, shortness of breath, or chest pain  How long have you had palpitations/irregular HR/ Afib? Are you having the symptoms now? Had episode yesterday, not having now  Are you currently experiencing lightheadedness, SOB or CP? No   Do you have a history of afib (atrial fibrillation) or irregular heart rhythm? No  Have you checked your BP or HR? (document readings if available): States his HR got up to 108 when Afib occurred and BP was normal   Are you experiencing any other symptoms? Felt heart fluttering    Has Afib recordings on his apple watch

## 2022-03-20 NOTE — Progress Notes (Unsigned)
Office Visit    Patient Name: Jesse Vargas Date of Encounter: 03/20/2022  Primary Care Provider:  [place] Pink, MD Primary Cardiologist:  Dorris Carnes, MD Primary Electrophysiologist: None  Chief Complaint    KALETH KOY is a 66 y.o. male with PMH of nonobstructive CAD s/p left heart cath 2007, HTN, DM type II, hyperlipidemia, OSA, SVT s/p ablation 2015 who presents today for complaint of palpitations.  Past Medical History    Past Medical History:  Diagnosis Date   CAD (coronary artery disease)    a. LHC (12/19/13):  mid LAD 50-60%, EF 55%, patent CFX and RCA - med Rx.   Cancer of kidney -status post nephrectomy    Kidney cancer 1999   Chronic neck pain    Chronic pain syndrome    Diabetes mellitus    Family history of long QT syndrome    Fibromyalgia    GERD (gastroesophageal reflux disease)    Hx of echocardiogram    a.  Echo (12/2013):  mod LVH, EF 55-60%, no RWMA, mild LAE   Hyperlipidemia    Hypertension    Insomnia    Sleep apnea    SVT (supraventricular tachycardia) ([place])    Documentation pending but occurring during the stress test with Dr. 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 by Dr Lovena Le   Past Surgical History:  Procedure Laterality Date   ABLATION  08/27/13   AVNRT ablatin by Dr Lovena Le   CHOLECYSTECTOMY     2000   elbow surgery     left   INNER EAR SURGERY     R ear   LEFT HEART CATHETERIZATION WITH CORONARY ANGIOGRAM N/A 12/19/2013   Procedure: LEFT HEART CATHETERIZATION WITH CORONARY ANGIOGRAM;  Surgeon: Sinclair Grooms, MD;  Location: [facility] CATH LAB;  Service: Cardiovascular;  Laterality: N/A;   NASAL SINUS SURGERY     2005   NEPHRECTOMY     L---1999 Cancer   SUPRAVENTRICULAR TACHYCARDIA ABLATION N/A 08/27/2013   Procedure: SUPRAVENTRICULAR TACHYCARDIA ABLATION;  Surgeon: Evans Lance, MD;  Location: [facility] CATH LAB;  Service: Cardiovascular;  Laterality: N/A;   TONSILLECTOMY     08/1988   UVULOPALATOPHARYNGOPLASTY     08/1988     Allergies  No Known Allergies  History of Present Illness    Jesse Vargas has a PMH of is a 66 year old male with the above mention past medical history who presents today for follow-up of palpitations.  Jesse Vargas was first seen by Dr. Harrington Challenger in 2012, he was previously followed by Dr. Towanda Malkin.  Patient had reports of chest pain and shortness of breath and was sent for [facility] that revealed EF of 65% and normal stress study.  Patient had elevated blood pressures and medications were titrated for optimal control.  He was referred to Dr. Lovena Le in 2015 for complaint of tachypalpitations.  He was treated with beta-blockers but experienced fatigue and sexual dysfunction.  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  He was seen in the ED on 12/2013 with complaint of chest pain with hypertension.  He underwent LHC by Dr. Tamala Julian that showed diffuse disease and mid LAD 50-60% and widely patent left circumflex and no high-grade obstructions in RCA.   Patient was seen last 12/2021 and blood pressure was noted to be elevated and amlodipine was increased to 5 mg twice daily.  Updated 2D echo was completed with EF of 60-65%, no RWMA, grade 2 DD, normal RV  systolic function with moderately dilated LA and mildly to moderately dilated RA and no aortic stenosis.  He contacted on-call services on 03/20/2022 with complaint of palpitations that occurred during breakfast yesterday.  Jesse Vargas presents today for follow-up alone.  Since last being seen in the office patient reports that he is experienced palpitations and fatigue.  He notes that his palpitations occurred while digging fence post in his yard.  Patient states that he became fatigued and lightheaded and sat down to rest but did not resolve.  He noted his heart rate elevated to 180s and down to the 120s.  He did not call EMS and states that heart rate resolved spontaneously.  He also notes similar incident following  breakfast and drinking coffee.  This event also resolved spontaneously without any intervention.  Today patient denies chest pain, palpitations, dyspnea, PND, orthopnea, nausea, vomiting, dizziness, syncope, edema, weight gain, or early satiety.  Home Medications    Current Outpatient Medications  Medication Sig Dispense Refill   amLODipine (NORVASC) 5 MG tablet Take 1 tablet (5 mg total) by mouth in the morning and at bedtime. TAKE 1 TABLET BY MOUTH IN  THE MORNING AND  IN THE  EVENING. 180 tablet 1   aspirin EC 81 MG tablet Take 81 mg by mouth daily.     atorvastatin (LIPITOR) 40 MG tablet TAKE 1 TABLET BY MOUTH  DAILY AT 6 PM 30 tablet 0   carvedilol (COREG) 25 MG tablet TAKE 1 TABLET BY MOUTH 2  TIMES DAILY. 180 tablet 0   cyclobenzaprine (FLEXERIL) 10 MG tablet Take 1 tablet at bedtime as needed for muscle spasms 30 tablet 5   Dulaglutide 1.5 MG/0.5ML SOPN Inject into the skin.     ergocalciferol (VITAMIN D2) 1.25 MG (50000 UT) capsule Take 1 capsule by mouth once a week.     ezetimibe (ZETIA) 10 MG tablet Take 1 tablet (10 mg total) by mouth daily. Please keep upcoming appointment for future refills thank you. 90 tablet 0   fenofibrate 160 MG tablet Take 1 tablet by mouth  daily 30 tablet 0   FLUoxetine (PROZAC) 40 MG capsule Take 1 capsule by mouth  daily 30 capsule 0   fluticasone (FLONASE) 50 MCG/ACT nasal spray Place 2 sprays into both nostrils daily. 48 g 1   gabapentin (NEURONTIN) 300 MG capsule Take 1 capsule by mouth 3  times daily 90 capsule 0   glipiZIDE (GLUCOTROL XL) 2.5 MG 24 hr tablet Take 3 tablets by mouth daily.     icosapent Ethyl (VASCEPA) 1 g capsule TAKE 2 CAPSULES(2 GRAMS) BY MOUTH TWICE DAILY 360 capsule 3   losartan-hydrochlorothiazide (HYZAAR) 100-25 MG tablet Take 1 tablet by mouth  daily 30 tablet 0   meloxicam (MOBIC) 15 MG tablet Take 1 tablet by mouth  daily 90 tablet 3   metFORMIN (GLUCOPHAGE) 1000 MG tablet Take 1500 mg with breakfast and 1000 mg with  dinner. Do not exceed 2500 mg per day 225 tablet 2   nitroGLYCERIN (NITROLINGUAL) 0.4 MG/SPRAY spray Place 1 spray under the tongue every 5 (five) minutes x 3 doses as needed for chest pain. 12 g 12   pantoprazole (PROTONIX) 20 MG tablet Take 1 tablet by mouth two  times daily 180 tablet 1   PROAIR HFA 108 (90 BASE) MCG/ACT inhaler INHALE 2 PUFFS INTO THE  LUNGS 4 TIMES DAILY AS  NEEDED FOR WHEEZING. 17 g 3   TRULICITY 3 VH/8.4ON SOPN SMARTSIG:3 Milligram(s) SUB-Q Once a Week  No current facility-administered medications for this visit.     Review of Systems  Please see the history of present illness.    (+) Fatigue (+) Palpitations  All other systems reviewed and are otherwise negative except as noted above.  Physical Exam    Wt Readings from Last 3 Encounters:  12/28/21 237 lb (107.5 kg)  12/17/20 238 lb 6.4 oz (108.1 kg)  09/15/19 236 lb 9.6 oz (107.3 kg)   EH:MCNOB were no vitals filed for this visit.,There is no height or weight on file to calculate BMI.  Constitutional:      Appearance: Healthy appearance. Not in distress.  Neck:     Vascular: JVD normal.  Pulmonary:     Effort: Pulmonary effort is normal.     Breath sounds: No wheezing. No rales. Diminished in the bases Cardiovascular:     Normal rate. Regular rhythm. Normal S1. Normal S2.      Murmurs: There is no murmur.  Edema:    Peripheral edema absent.  Abdominal:     Palpations: Abdomen is soft non tender. There is no hepatomegaly.  Skin:    General: Skin is warm and dry.  Neurological:     General: No focal deficit present.     Mental Status: Alert and oriented to person, place and time.     Cranial Nerves: Cranial nerves are intact.  EKG/LABS/Other Studies Reviewed    ECG personally reviewed by me today -none completed today  Lab Results  Component Value Date   WBC 8.3 12/28/2021   HGB 14.2 12/28/2021   HCT 40.4 12/28/2021   MCV 89 12/28/2021   PLT 192 12/28/2021   Lab Results  Component  Value Date   CREATININE 1.12 12/28/2021   BUN 24 12/28/2021   NA 141 12/28/2021   K 3.7 12/28/2021   CL 100 12/28/2021   CO2 21 12/28/2021   Lab Results  Component Value Date   ALT 26 01/04/2015   AST 20 01/04/2015   ALKPHOS 42 01/04/2015   BILITOT 0.3 10/19/2011   Lab Results  Component Value Date   CHOL 118 12/28/2021   HDL 21 (L) 12/28/2021   LDLCALC 21 12/28/2021   LDLDIRECT 71.5 10/19/2011   TRIG 570 (HH) 12/28/2021   CHOLHDL 5.6 (H) 12/28/2021    Lab Results  Component Value Date   HGBA1C 7.9 (H) 12/28/2021    Assessment & Plan    1.  Nonobstructive CAD: -s/p LHC in 2015 by Dr. Tamala Julian with 50-60% LAD obstruction -Patient reports today no chest discomfort -Continue GDMT with ASA 81 mg, Lipitor 40 mg, carvedilol 25 mg twice daily, fenofibrate 160 mg, ezetimibe 10 mg  2.  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   -We will increase carvedilol to 37.5 twice daily -14-day ZIO monitor to evaluate palpitations and burden of arrhythmia  3.  DM type II: -Most recent hemoglobin A1c was 7.9 above goal of less than 7 -Continue current antidiabetic therapy per PCP  4.  Hypertension: -Patient's blood pressure today was well controlled at 120/60 -Continue carvedilol and amlodipine 5 mg twice daily  Disposition: Follow-up with Dorris Carnes, MD or APP in 1 months    Medication Adjustments/Labs and Tests Ordered: Current medicines are reviewed at length with the patient today.  Concerns regarding medicines are outlined above.   Signed, Mable Fill, Marissa Nestle, NP 03/20/2022,  3:12 PM [facility]

## 2022-03-21 ENCOUNTER — Ambulatory Visit: Payer: Managed Care, Other (non HMO) | Attending: Nurse Practitioner | Admitting: Nurse Practitioner

## 2022-03-21 ENCOUNTER — Encounter: Payer: Self-pay | Admitting: Nurse Practitioner

## 2022-03-21 ENCOUNTER — Ambulatory Visit (INDEPENDENT_AMBULATORY_CARE_PROVIDER_SITE_OTHER): Payer: Managed Care, Other (non HMO)

## 2022-03-21 VITALS — BP 120/60 | HR 62 | Ht 69.0 in | Wt 232.0 lb

## 2022-03-21 DIAGNOSIS — I4891 Unspecified atrial fibrillation: Secondary | ICD-10-CM | POA: Diagnosis not present

## 2022-03-21 DIAGNOSIS — E118 Type 2 diabetes mellitus with unspecified complications: Secondary | ICD-10-CM

## 2022-03-21 DIAGNOSIS — I471 Supraventricular tachycardia: Secondary | ICD-10-CM | POA: Diagnosis not present

## 2022-03-21 DIAGNOSIS — E782 Mixed hyperlipidemia: Secondary | ICD-10-CM | POA: Diagnosis not present

## 2022-03-21 MED ORDER — CARVEDILOL 25 MG PO TABS: 37.5000 mg | ORAL_TABLET | Freq: Two times a day (BID) | ORAL | 3 refills | Status: DC

## 2022-03-21 NOTE — Patient Instructions (Addendum)
Medication Instructions:  Your physician has recommended you make the following change in your medication:  INCREASE COREG TO 37.5 MG TWICE DAILY   *If you need a refill on your cardiac medications before your next appointment, please call your pharmacy*   Lab Work: NONE If you have labs (blood work) drawn today and your tests are completely normal, you will receive your results only by: [place] (if you have MyChart) OR A paper copy in the mail If you have any lab test that is abnormal or we need to change your treatment, we will call you to review the results.   Testing/Procedures: [place]- Long Term Monitor Instructions  Your physician has requested you wear a ZIO patch monitor for 14 days.  This is a single patch monitor. [employer] supplies one patch monitor per enrollment. Additional stickers are not available. Please do not apply patch if you will be having a Nuclear Stress Test,  Echocardiogram, Cardiac CT, MRI, or Chest Xray during the period you would be wearing the  monitor. The patch cannot be worn during these tests. You cannot remove and re-apply the  ZIO XT patch monitor.  Your ZIO patch monitor will be mailed 3 day USPS to your address on file. It may take 3-5 days  to receive your monitor after you have been enrolled.  Once you have received your monitor, please review the enclosed instructions. Your monitor  has already been registered assigning a specific monitor serial # to you.  Billing and Patient Assistance Program Information  We have supplied [employer] with any of your insurance information on file for billing purposes. [employer] offers a sliding scale Patient Assistance Program for patients that do not have  insurance, or whose insurance does not completely cover the cost of the ZIO monitor.  You must apply for the Patient Assistance Program to qualify for this discounted rate.  To apply, please call [employer] at [phone], select option 4, select option 2,  ask to apply for  Patient Assistance Program. Theodore Demark will ask your household income, and how many people  are in your household. They will quote your out-of-pocket cost based on that information.  [employer] will also be able to set up a 81-month interest-free payment plan if needed.  Applying the monitor   Shave hair from upper left chest.  Hold abrader disc by orange tab. Rub abrader in 40 strokes over the upper left chest as  indicated in your monitor instructions.  Clean area with 4 enclosed alcohol pads. Let dry.  Apply patch as indicated in monitor instructions. Patch will be placed under collarbone on left  side of chest with arrow pointing upward.  Rub patch adhesive wings for 2 minutes. Remove white label marked "1". Remove the white  label marked "2". Rub patch adhesive wings for 2 additional minutes.  While looking in a mirror, press and release button in center of patch. A small green light will  flash 3-4 times. This will be your only indicator that the monitor has been turned on.  Do not shower for the first 24 hours. You may shower after the first 24 hours.  Press the button if you feel a symptom. You will hear a small click. Record Date, Time and  Symptom in the Patient Logbook.  When you are ready to remove the patch, follow instructions on the last 2 pages of Patient  Logbook. Stick patch monitor onto the last page of Patient Logbook.  Place Patient Logbook in the blue  and white box. Use locking tab on box and tape box closed  securely. The blue and white box has prepaid postage on it. Please place it in the mailbox as  soon as possible. Your physician should have your test results approximately 7 days after the  monitor has been mailed back to [facility].  Call [place] at [phone] if you have questions regarding  your ZIO XT patch monitor. Call them immediately if you see an orange light blinking on your  monitor.  If your monitor falls off  in less than 4 days, contact our Monitor department at [phone].  If your monitor becomes loose or falls off after 4 days call [employer] at [phone] for  suggestions on securing your monitor    Follow-Up: At [facility], you and your health needs are our priority.  As part of our continuing mission to provide you with exceptional heart care, we have created designated Provider Care Teams.  These Care Teams include your primary Cardiologist (physician) and Advanced Practice Providers (APPs -  Physician Assistants and Nurse Practitioners) who all work together to provide you with the care you need, when you need it.  We recommend signing up for the patient portal called "MyChart".  Sign up information is provided on this After Visit Summary.  MyChart is used to connect with patients for Virtual Visits (Telemedicine).  Patients are able to view lab/test results, encounter notes, upcoming appointments, etc.  Non-urgent messages can be sent to your provider as well.   To learn more about what you can do with MyChart, go to [email or web address].    Your next appointment:   KEEP SCHEDULED FOLLOW-UP  Important Information About Sugar

## 2022-03-21 NOTE — Progress Notes (Unsigned)
Enrolled for [employer] to mail a ZIO XT long term holter monitor to the patients address on file.   Dr. Ross to read. 

## 2022-03-24 DIAGNOSIS — I4891 Unspecified atrial fibrillation: Secondary | ICD-10-CM

## 2022-04-10 ENCOUNTER — Telehealth: Payer: Self-pay | Admitting: Nurse Practitioner

## 2022-04-10 NOTE — Telephone Encounter (Signed)
Spoke with patient that reports since starting new dosage of Coreg he feels drowsy, sometimes dizzy and more fatigued in general. Patient wanted to make provider aware and wants to know if it will improve with time.  I advised I would let his provider know and we follow up with him.

## 2022-04-10 NOTE — Telephone Encounter (Signed)
Pt c/o medication issue:  1. Name of Medication:  carvedilol (COREG)  37.5 MG tablet  2. How are you currently taking this medication (dosage and times per day)?  Twice daily as prescribed  3. Are you having a reaction (difficulty breathing--STAT)?   4. What is your medication issue?   Patient states since this medication was increased from 25 MG to 37.5 MG he has experienced dizziness, drowsiness, and is overall more tired. He would like to know if it is alright to go back to Coreg 25 MG. He also mentions he has not fainted. BP is good, HR is around 60.

## 2022-04-11 NOTE — Telephone Encounter (Signed)
I spoke with the pt and he reports that his palpitations have subsided mostly and  he is feeling better... he will give it more time prior to making dose changes and will keep his 04/27/22 OV.

## 2022-04-26 NOTE — Progress Notes (Signed)
Cardiology Office Note   Date:  04/29/2022   ID:  Jesse Vargas, Jesse Vargas 17-May-1956, MRN [id number]  PCP:  Maryland Pink, MD  Cardiologist:   Dorris Carnes, MD   Pt presents for r/u of CAD    History of Present Illness: Jesse Vargas is a 66 y.o. male with a history of mild CAD by cath in 2007. Also a history of HTN, SVT(s/p ablation for AVNRT in 2015)  M , HL, sleep apnea. Myoview in 2012 done after seen. This was normal.  In 2015 he had CP that lead to [facility].  This showed 50 to 60% LAD lesion  Pt also with hx of SVT and is s/p ablation in 2015 I saw him in JUne 2023   Diet at time: Diet:   Breakfast   coffee with cream; sausage roll up.   Lunch   hamburger or hot dog Or salad or gyro Dinner   Meat, vegg  BP wa high  I recomm increasing amlodipine to 5 mg BID   Also sched patient for echo   LVEF wa normal  Gr II diastolic dysfunction     He was seen in September 2023 for palpitations, fatigue   The patientnoticed while working in yard.    HR 180s then down to 120s .  Zio patch ordered and showed atrial fibrillation 3% of time    Carvedilol dose increased    with improvement of symptoms      Since then he denies CP  says he has fatigue.  Denies dizziness  Has hx of sleep apnea.   Has been awhile since had evaluation.     Current Outpatient Medications  Medication Sig Dispense Refill   ALPRAZolam (XANAX XR) 2 MG 24 hr tablet Take 2 mg by mouth at bedtime.     amLODipine (NORVASC) 10 MG tablet Take 10 mg by mouth in the morning and at bedtime.     apixaban (ELIQUIS) 5 MG TABS tablet Take 1 tablet (5 mg total) by mouth 2 (two) times daily. 180 tablet 3   aspirin EC 81 MG tablet Take 81 mg by mouth daily.     atorvastatin (LIPITOR) 40 MG tablet TAKE 1 TABLET BY MOUTH  DAILY AT 6 PM 30 tablet 0   cyclobenzaprine (FLEXERIL) 10 MG tablet Take 1 tablet at bedtime as needed for muscle spasms 30 tablet 5   ergocalciferol (VITAMIN D2) 1.25 MG (50000 UT) capsule Take 1 capsule by mouth once a  week.     escitalopram (LEXAPRO) 20 MG tablet Take 20 mg by mouth daily.     ezetimibe (ZETIA) 10 MG tablet Take 1 tablet (10 mg total) by mouth daily. Please keep upcoming appointment for future refills thank you. 90 tablet 0   fenofibrate 160 MG tablet Take 1 tablet by mouth  daily 30 tablet 0   FLUoxetine (PROZAC) 40 MG capsule Take 1 capsule by mouth  daily 30 capsule 0   fluticasone (FLONASE) 50 MCG/ACT nasal spray Place 2 sprays into both nostrils daily. 48 g 1   glipiZIDE (GLUCOTROL XL) 2.5 MG 24 hr tablet Take 4 tablets by mouth daily.     icosapent Ethyl (VASCEPA) 1 g capsule TAKE 2 CAPSULES(2 GRAMS) BY MOUTH TWICE DAILY 360 capsule 3   losartan-hydrochlorothiazide (HYZAAR) 100-25 MG tablet Take 1 tablet by mouth  daily 30 tablet 0   meloxicam (MOBIC) 15 MG tablet Take 1 tablet by mouth  daily 90 tablet 3   metFORMIN (GLUCOPHAGE)  1000 MG tablet Take 1500 mg with breakfast and 1000 mg with dinner. Do not exceed 2500 mg per day (Patient taking differently: 1,000 mg in the morning and at bedtime. Take 1500 mg with breakfast and 1000 mg with dinner. Do not exceed 2500 mg per day) 225 tablet 2   nitroGLYCERIN (NITROLINGUAL) 0.4 MG/SPRAY spray Place 1 spray under the tongue every 5 (five) minutes x 3 doses as needed for chest pain. 12 g 12   olmesartan-hydrochlorothiazide (BENICAR HCT) 40-25 MG tablet Take 1 tablet by mouth daily.     omeprazole (PRILOSEC) 20 MG capsule Take 1 capsule by mouth daily.     PROAIR HFA 108 (90 BASE) MCG/ACT inhaler INHALE 2 PUFFS INTO THE  LUNGS 4 TIMES DAILY AS  NEEDED FOR WHEEZING. 17 g 3   tadalafil (CIALIS) 20 MG tablet Take 20 mg by mouth daily as needed.     amLODipine (NORVASC) 5 MG tablet Take 1 tablet (5 mg total) by mouth in the morning and at bedtime. TAKE 1 TABLET BY MOUTH IN  THE MORNING AND  IN THE  EVENING. (Patient not taking: Reported on 04/27/2022) 180 tablet 1   carvedilol (COREG) 25 MG tablet Take 1 tablet (25 mg total) by mouth 2 (two) times  daily. 180 tablet 3   Dulaglutide 1.5 MG/0.5ML SOPN Inject into the skin. (Patient not taking: Reported on 04/27/2022)     gabapentin (NEURONTIN) 300 MG capsule Take 1 capsule by mouth 3  times daily (Patient not taking: Reported on 04/27/2022) 90 capsule 0   pantoprazole (PROTONIX) 20 MG tablet Take 1 tablet by mouth two  times daily (Patient not taking: Reported on 04/27/2022) 902 tablet 1   TRULICITY 3 IO/9.7DZ SOPN SMARTSIG:3 Milligram(s) SUB-Q Once a Week (Patient not taking: Reported on 04/27/2022)     No current facility-administered medications for this visit.    Allergies:   Patient has no known allergies.   Past Medical History:  Diagnosis Date   CAD (coronary artery disease)    a. LHC (12/19/13):  mid LAD 50-60%, EF 55%, patent CFX and RCA - med Rx.   Cancer of kidney -status post nephrectomy    Kidney cancer 1999   Chronic neck pain    Chronic pain syndrome    Diabetes mellitus    Family history of long QT syndrome    Fibromyalgia    GERD (gastroesophageal reflux disease)    Hx of echocardiogram    a.  Echo (12/2013):  mod LVH, EF 55-60%, no RWMA, mild LAE   Hyperlipidemia    Hypertension    Insomnia    Sleep apnea    SVT (supraventricular tachycardia)    Documentation pending but occurring during the stress test with Dr. 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 by Dr Lovena Le    Past Surgical History:  Procedure Laterality Date   ABLATION  08/27/13   AVNRT ablatin by Dr Lovena Le   CHOLECYSTECTOMY     2000   elbow surgery     left   INNER EAR SURGERY     R ear   LEFT HEART CATHETERIZATION WITH CORONARY ANGIOGRAM N/A 12/19/2013   Procedure: LEFT HEART CATHETERIZATION WITH CORONARY ANGIOGRAM;  Surgeon: Sinclair Grooms, MD;  Location: [facility] CATH LAB;  Service: Cardiovascular;  Laterality: N/A;   NASAL SINUS SURGERY     2005   NEPHRECTOMY     L---1999 Cancer   SUPRAVENTRICULAR TACHYCARDIA ABLATION N/A 08/27/2013   Procedure: SUPRAVENTRICULAR TACHYCARDIA ABLATION;  Surgeon:  Evans Lance, MD;  Location: [facility] CATH LAB;  Service: Cardiovascular;  Laterality: N/A;   TONSILLECTOMY     08/1988   UVULOPALATOPHARYNGOPLASTY     08/1988     Social History:  The patient  reports that he quit smoking about 24 years ago. His smoking use included cigarettes. He has a 50.00 pack-year smoking history. He has never used smokeless tobacco. He reports current alcohol use. He reports that he does not use drugs.   Family History:  The patient's family history includes Colon polyps in his father; Diabetes in his father and mother; Hyperlipidemia in his father; Hypertension in his father and mother.    ROS:  Please see the history of present illness. All other systems are reviewed and  Negative to the above problem except as noted.    PHYSICAL EXAM: VS:  BP 110/70   Pulse (!) 58   Ht '5\' 9"'$  (1.753 m)   Wt 237 lb 3.2 oz (107.6 kg)   SpO2 95%   BMI 35.03 kg/m   GEN  Obese 66 yo  in no acute distress HEENT: normal Neck: no JVD, no carotid bruits Cardiac: RRR; II/VI systolic murmur LSB    No  LE  edema  Respiratory:  clear to auscultation bilaterally GI: soft, nontender, nondistended, + BS  No hepatomegaly  MS: no deformity Moving all extremities   Skin: warm and dry, no rash Neuro:  Strength and sensation are intact Psych: euthymic mood, full affect   EKG:  EKG is not ordered    Zio patch Sept 2023 Predominant rhythm:  SInus rhythm     Rates 46 to 88 bpm  Average HR 61 bpm Atrial fib/flutter occurred 3% time    Max HR 152 bpm   Average HR 61 bpm Rare PVC Triggered events mostly occurred with atrial fib with RVR   One episode occurred with SR with PVC   Patient had a min HR of 46 bpm, max HR of 152 bpm, and avg HR of 61 bpm. Predominant underlying rhythm was Sinus Rhythm. Atrial Fibrillation/Flutter occurred (3% burden), ranging from 55-152 bpm (avg of 87 bpm), the longest lasting 8 hours 13 mins with  an avg rate of 85 bpm. Atrial Fibrillation/Flutter was detected  within +/- 45 seconds of symptomatic patient event(s). Isolated SVEs were rare (<1.0%), SVE Couplets were rare (<1.0%), and SVE Triplets were rare (<1.0%). Isolated VEs were rare (<1.0%,  378), VE Couplets were rare (<1.0%, 12), and VE Triplets were rare (<1.0%, 3).   Echo   JUly 2023   1. Left ventricular ejection fraction, by estimation, is 60 to 65%. The  left ventricle has normal function. The left ventricle has no regional  wall motion abnormalities. There is mild concentric left ventricular  hypertrophy. Left ventricular diastolic  parameters are consistent with Grade II diastolic dysfunction  (pseudonormalization). The average left ventricular global longitudinal  strain is -26.7 %. The global longitudinal strain is normal.   2. Right ventricular systolic function is normal. The right ventricular  size is mildly enlarged. There is normal pulmonary artery systolic  pressure.   3. Left atrial size was moderately dilated.   4. Right atrial size was mild to moderately dilated.   5. The mitral valve is normal in structure. Trivial mitral valve  regurgitation. No evidence of mitral stenosis.   6. The aortic valve is tricuspid. There is mild calcification of the  aortic valve. Aortic valve regurgitation is not visualized. Aortic valve  sclerosis is  present, with no evidence of aortic valve stenosis.   7. The inferior vena cava is normal in size with greater than 50%  respiratory variability, suggesting right atrial pressure of 3 mmHg.   Comparison(s): No significant change from prior study. Lipid Panel    Component Value Date/Time   CHOL 118 12/28/2021 1649   TRIG 570 (HH) 12/28/2021 1649   HDL 21 (L) 12/28/2021 1649   CHOLHDL 5.6 (H) 12/28/2021 1649   CHOLHDL 6.4 12/19/2013 0400   VLDL UNABLE TO CALCULATE IF TRIGLYCERIDE OVER 400 mg/dL 12/19/2013 0400   LDLCALC 21 12/28/2021 1649   LDLDIRECT 71.5 10/19/2011 1057      Wt Readings from Last 3 Encounters:  04/27/22 237 lb 3.2  oz (107.6 kg)  03/21/22 232 lb (105.2 kg)  12/28/21 237 lb (107.5 kg)      ASSESSMENT AND PLAN:  1. Atrial fibrillation   This is new for patient  His CHADSVASc score is 3  Will rx Eliquis     He is very fatgued on carvedidlol 37.5 bid   WIll back down to 25 bid and follow   I will review with EP antiarrhythmic     2  CAD  Nonobstructive by cath in 2007     Pt denies CP       3  HTN  BP is excellent   Follow as change meds   4.  HL   Will check lipids again today   5  OSA   Will set up for sleep study      6  DM ]A1C 7.9   Discussed diet      Signed, Dorris Carnes, MD  04/29/2022 10:27 AM    [facility] [address], [place], [place]  [address] Phone: [phone]; Fax: [phone]

## 2022-04-27 ENCOUNTER — Ambulatory Visit: Payer: Managed Care, Other (non HMO) | Attending: Internal Medicine | Admitting: Internal Medicine

## 2022-04-27 ENCOUNTER — Encounter: Payer: Self-pay | Admitting: Internal Medicine

## 2022-04-27 VITALS — BP 110/70 | HR 58 | Ht 69.0 in | Wt 237.2 lb

## 2022-04-27 DIAGNOSIS — G4733 Obstructive sleep apnea (adult) (pediatric): Secondary | ICD-10-CM

## 2022-04-27 MED ORDER — APIXABAN 5 MG PO TABS: 5.0000 mg | ORAL_TABLET | Freq: Two times a day (BID) | ORAL | 3 refills | Status: DC

## 2022-04-27 MED ORDER — CARVEDILOL 25 MG PO TABS: 25.0000 mg | ORAL_TABLET | Freq: Two times a day (BID) | ORAL | 3 refills | Status: DC

## 2022-04-27 NOTE — Patient Instructions (Addendum)
Medication Instructions:  Eliquis 5 mg twice a day  Carvedilol 25 mg twice a day  *If you need a refill on your cardiac medications before your next appointment, please call your pharmacy*   Lab Work:  If you have labs (blood work) drawn today and your tests are completely normal, you will receive your results only by: [place] (if you have MyChart) OR A paper copy in the mail If you have any lab test that is abnormal or we need to change your treatment, we will call you to review the results.   Testing/Procedures: Sleep study     Follow-Up: At [facility], you and your health needs are our priority.  As part of our continuing mission to provide you with exceptional heart care, we have created designated Provider Care Teams.  These Care Teams include your primary Cardiologist (physician) and Advanced Practice Providers (APPs -  Physician Assistants and Nurse Practitioners) who all work together to provide you with the care you need, when you need it.  We recommend signing up for the patient portal called "MyChart".  Sign up information is provided on this After Visit Summary.  MyChart is used to connect with patients for Virtual Visits (Telemedicine).  Patients are able to view lab/test results, encounter notes, upcoming appointments, etc.  Non-urgent messages can be sent to your provider as well.   To learn more about what you can do with MyChart, go to [email or web address].    Your next appointment:    4 week(s)  The format for your next appointment:   In Person  Provider:   Dorris Carnes, MD     Other Instructions   Important Information About Sugar

## 2022-04-28 ENCOUNTER — Telehealth: Payer: Self-pay | Admitting: Internal Medicine

## 2022-04-28 ENCOUNTER — Telehealth: Payer: Self-pay

## 2022-04-28 NOTE — Telephone Encounter (Signed)
Notified by [facility] RN that she contacted the patient regarding samples and he does not want to pick these up.  He was given a co-pay yesterday per Lamar Benes, RN  Returning samples and free trial card to our sample closet.   Returning: Eliquis 5 mg Lot: [id number] Exp: 11/2023 # 2 boxes

## 2022-04-28 NOTE — Telephone Encounter (Signed)
Secure chat message received from Lamar Benes, RN asking if we possibly had any samples for this patient that was seen by Dr. Harrington Challenger late yesterday and is a new start for Eliquis 5 mg BID.  I advised I could offer 2 weeks worth of samples.  Samples Given: Eliquis 5 mg Lot: [id number] Exp: 11/2023 # 2 boxes  Placed at our front desk for pick up with a free 30-day trial card.   Per Lelon Frohlich, she will notify the patient.

## 2022-05-01 ENCOUNTER — Encounter: Payer: Self-pay | Admitting: Internal Medicine

## 2022-05-03 ENCOUNTER — Telehealth: Payer: Self-pay | Admitting: Internal Medicine

## 2022-05-03 NOTE — Telephone Encounter (Signed)
Reviewed with EP   I would recomm Multaq 400 bid with food to help keep him in SR    Take with food   Get EKG in 2 wks   Get BMET at that time

## 2022-05-04 ENCOUNTER — Encounter: Payer: Self-pay | Admitting: Internal Medicine

## 2022-05-04 MED ORDER — MULTAQ 400 MG PO TABS: 400.0000 mg | ORAL_TABLET | Freq: Two times a day (BID) | ORAL | 3 refills | Status: DC

## 2022-05-04 NOTE — Telephone Encounter (Signed)
I spoke with the pt and advised him Dr Harrington Challenger' recommendations to start the Multaq but he says he will check the cost first and let me know... he says he has been feeling well and per his request and he will reach back out to me if he decides to get started and set up the EKG and labs and will try to get in [place] where he is daily for work.

## 2022-05-04 NOTE — Addendum Note (Signed)
Addended by: Stephani Police on: 05/04/2022 02:09 PM   Modules accepted: Orders

## 2022-05-04 NOTE — Telephone Encounter (Signed)
Left message for the pt to call back.

## 2022-05-05 ENCOUNTER — Telehealth: Payer: Self-pay | Admitting: *Deleted

## 2022-05-05 NOTE — Telephone Encounter (Signed)
Prior Authorization for NPSG sent to [place] via web portal. Tracking Number .   DENIED:NOT APPROVED:NOT MEDICALLY NECESSARY:NO CO-MORBIDITY

## 2022-05-06 ENCOUNTER — Encounter: Payer: Self-pay | Admitting: Internal Medicine

## 2022-05-06 DIAGNOSIS — I4891 Unspecified atrial fibrillation: Secondary | ICD-10-CM

## 2022-05-06 DIAGNOSIS — Z79899 Other long term (current) drug therapy: Secondary | ICD-10-CM

## 2022-05-06 DIAGNOSIS — I471 Supraventricular tachycardia, unspecified: Secondary | ICD-10-CM

## 2022-05-08 ENCOUNTER — Encounter: Payer: Self-pay | Admitting: Internal Medicine

## 2022-05-16 ENCOUNTER — Encounter: Payer: Self-pay | Admitting: Internal Medicine

## 2022-05-16 DIAGNOSIS — G4733 Obstructive sleep apnea (adult) (pediatric): Secondary | ICD-10-CM

## 2022-05-24 NOTE — Telephone Encounter (Signed)
Go ahead and order home study

## 2022-05-25 ENCOUNTER — Ambulatory Visit: Payer: Managed Care, Other (non HMO) | Attending: Internal Medicine

## 2022-05-25 ENCOUNTER — Ambulatory Visit: Payer: Managed Care, Other (non HMO) | Attending: Cardiovascular Disease

## 2022-05-25 VITALS — BP 145/80 | HR 54 | Wt 234.0 lb

## 2022-05-25 DIAGNOSIS — I471 Supraventricular tachycardia, unspecified: Secondary | ICD-10-CM

## 2022-05-25 DIAGNOSIS — I4891 Unspecified atrial fibrillation: Secondary | ICD-10-CM

## 2022-05-25 DIAGNOSIS — Z79899 Other long term (current) drug therapy: Secondary | ICD-10-CM

## 2022-05-25 NOTE — Patient Instructions (Signed)
Medication Instructions:  Your physician recommends that you continue on your current medications as directed. Please refer to the Current Medication list given to you today.  *If you need a refill on your cardiac medications before your next appointment, please call your pharmacy*   Lab Work: None ordered.  If you have labs (blood work) drawn today and your tests are completely normal, you will receive your results only by: [place] (if you have MyChart) OR A paper copy in the mail If you have any lab test that is abnormal or we need to change your treatment, we will call you to review the results.   Testing/Procedures: None ordered.    Follow-Up: At [facility], you and your health needs are our priority.  As part of our continuing mission to provide you with exceptional heart care, we have created designated Provider Care Teams.  These Care Teams include your primary Cardiologist (physician) and Advanced Practice Providers (APPs -  Physician Assistants and Nurse Practitioners) who all work together to provide you with the care you need, when you need it.  We recommend signing up for the patient portal called "MyChart".  Sign up information is provided on this After Visit Summary.  MyChart is used to connect with patients for Virtual Visits (Telemedicine).  Patients are able to view lab/test results, encounter notes, upcoming appointments, etc.  Non-urgent messages can be sent to your provider as well.   To learn more about what you can do with MyChart, go to [email or web address].    Your next appointment:   Follow up as scheduled.  Important Information About Sugar

## 2022-05-25 NOTE — Addendum Note (Signed)
Addended by: Stephani Police on: 05/25/2022 08:49 AM   Modules accepted: Orders

## 2022-05-25 NOTE — Progress Notes (Signed)
   Nurse Visit   Date of Encounter: 05/25/2022 ID: Jesse Vargas, DOB 05-20-1956, MRN [id number]  PCP:  [place] Pink, [place] [facility] Cardiologist:  Dorris Carnes, MD      Visit Details   VS:  BP (!) 145/80   Pulse (!) 54   Wt 234 lb (106.1 kg)   BMI 34.56 kg/m  , BMI Body mass index is 34.56 kg/m.  Wt Readings from Last 3 Encounters:  05/25/22 234 lb (106.1 kg)  04/27/22 237 lb 3.2 oz (107.6 kg)  03/21/22 232 lb (105.2 kg)     Reason for visit: EKG for Multaq start Performed today: Vitals, EKG, and consult with Dr Johnsie Cancel, DOD Changes (medications, testing, etc.) : no changes Length of Visit: 25 minutes    Medications Adjustments/Labs and Tests Ordered: No orders of the defined types were placed in this encounter.  No orders of the defined types were placed in this encounter.  Pt presents today for EKG at the request of Dr Harrington Challenger due to [facility] start.  Pt reports he is tolerating medication well and has no complaints today.  Dr Johnsie Cancel, DOD reviewed EKG.  Pt will continue medication and follow up as scheduled.  Signed, Thora Lance, RN  05/25/2022 2:52 PM

## 2022-05-26 LAB — BASIC METABOLIC PANEL
BUN/Creatinine Ratio: 16 (ref 10–24)
BUN: 20 mg/dL (ref 8–27)
CO2: 26 mmol/L (ref 20–29)
Calcium: 10 mg/dL (ref 8.6–10.2)
Chloride: 102 mmol/L (ref 96–106)
Creatinine, Ser: 1.26 mg/dL (ref 0.76–1.27)
Glucose: 60 mg/dL — ABNORMAL LOW (ref 70–99)
Potassium: 4.4 mmol/L (ref 3.5–5.2)
Sodium: 142 mmol/L (ref 134–144)
eGFR: 64 mL/min/{1.73_m2} (ref >59)

## 2022-06-06 NOTE — Progress Notes (Deleted)
Cardiology Office Note   Date:  06/06/2022   ID:  DANDRAE KUSTRA, DOB June 22, 1956, MRN [id number]  PCP:  Maryland Pink, MD  Cardiologist:   Dorris Carnes, MD   Pt presents for r/u of CAD    History of Present Illness: Jesse Vargas is a 66 y.o. male with a history of mild CAD by cath in 2007. Also a history of HTN, SVT(s/p ablation for AVNRT in 2015)  M , HL, sleep apnea. Myoview in 2012 done after seen. This was normal.  In 2015 he had CP that lead to [facility].  This showed 50 to 60% LAD lesion  Pt also with hx of SVT and is s/p ablation in 2015 I saw him in JUne 2023   Diet at time: Diet:   Breakfast   coffee with cream; sausage roll up.   Lunch   hamburger or hot dog Or salad or gyro Dinner   Meat, vegg  BP wa high  I recomm increasing amlodipine to 5 mg BID   Also sched patient for echo   LVEF wa normal  Gr II diastolic dysfunction     He was seen in September 2023 for palpitations, fatigue   The patientnoticed while working in yard.    HR 180s then down to 120s .  Zio patch ordered and showed atrial fibrillation 3% of time    Carvedilol dose increased    with improvement of symptoms      Since then he denies CP  says he has fatigue.  Denies dizziness  Has hx of sleep apnea.   Has been awhile since had evaluation.    I saw the pt in October 2023     Current Outpatient Medications  Medication Sig Dispense Refill   ALPRAZolam (XANAX XR) 2 MG 24 hr tablet Take 2 mg by mouth at bedtime.     amLODipine (NORVASC) 10 MG tablet Take 10 mg by mouth in the morning and at bedtime.     amLODipine (NORVASC) 5 MG tablet Take 1 tablet (5 mg total) by mouth in the morning and at bedtime. TAKE 1 TABLET BY MOUTH IN  THE MORNING AND  IN THE  EVENING. (Patient not taking: Reported on 04/27/2022) 180 tablet 1   apixaban (ELIQUIS) 5 MG TABS tablet Take 1 tablet (5 mg total) by mouth 2 (two) times daily. 180 tablet 3   aspirin EC 81 MG tablet Take 81 mg by mouth daily.     atorvastatin (LIPITOR) 40  MG tablet TAKE 1 TABLET BY MOUTH  DAILY AT 6 PM 30 tablet 0   carvedilol (COREG) 25 MG tablet Take 1 tablet (25 mg total) by mouth 2 (two) times daily. 180 tablet 3   cyclobenzaprine (FLEXERIL) 10 MG tablet Take 1 tablet at bedtime as needed for muscle spasms 30 tablet 5   dronedarone (MULTAQ) 400 MG tablet Take 1 tablet (400 mg total) by mouth 2 (two) times daily with a meal. 180 tablet 3   Dulaglutide 1.5 MG/0.5ML SOPN Inject into the skin. (Patient not taking: Reported on 04/27/2022)     ergocalciferol (VITAMIN D2) 1.25 MG (50000 UT) capsule Take 1 capsule by mouth once a week.     escitalopram (LEXAPRO) 20 MG tablet Take 20 mg by mouth daily.     ezetimibe (ZETIA) 10 MG tablet Take 1 tablet (10 mg total) by mouth daily. Please keep upcoming appointment for future refills thank you. 90 tablet 0   fenofibrate 160 MG tablet  Take 1 tablet by mouth  daily 30 tablet 0   FLUoxetine (PROZAC) 40 MG capsule Take 1 capsule by mouth  daily 30 capsule 0   fluticasone (FLONASE) 50 MCG/ACT nasal spray Place 2 sprays into both nostrils daily. 48 g 1   gabapentin (NEURONTIN) 300 MG capsule Take 1 capsule by mouth 3  times daily (Patient not taking: Reported on 04/27/2022) 90 capsule 0   glipiZIDE (GLUCOTROL XL) 2.5 MG 24 hr tablet Take 4 tablets by mouth daily.     icosapent Ethyl (VASCEPA) 1 g capsule TAKE 2 CAPSULES(2 GRAMS) BY MOUTH TWICE DAILY 360 capsule 3   losartan-hydrochlorothiazide (HYZAAR) 100-25 MG tablet Take 1 tablet by mouth  daily 30 tablet 0   meloxicam (MOBIC) 15 MG tablet Take 1 tablet by mouth  daily 90 tablet 3   metFORMIN (GLUCOPHAGE) 1000 MG tablet Take 1500 mg with breakfast and 1000 mg with dinner. Do not exceed 2500 mg per day (Patient taking differently: 1,000 mg in the morning and at bedtime. Take 1500 mg with breakfast and 1000 mg with dinner. Do not exceed 2500 mg per day) 225 tablet 2   nitroGLYCERIN (NITROLINGUAL) 0.4 MG/SPRAY spray Place 1 spray under the tongue every 5 (five)  minutes x 3 doses as needed for chest pain. 12 g 12   olmesartan-hydrochlorothiazide (BENICAR HCT) 40-25 MG tablet Take 1 tablet by mouth daily.     omeprazole (PRILOSEC) 20 MG capsule Take 1 capsule by mouth daily.     pantoprazole (PROTONIX) 20 MG tablet Take 1 tablet by mouth two  times daily (Patient not taking: Reported on 04/27/2022) 180 tablet 1   PROAIR HFA 108 (90 BASE) MCG/ACT inhaler INHALE 2 PUFFS INTO THE  LUNGS 4 TIMES DAILY AS  NEEDED FOR WHEEZING. 17 g 3   tadalafil (CIALIS) 20 MG tablet Take 20 mg by mouth daily as needed.     TRULICITY 3 ZO/1.0RU SOPN SMARTSIG:3 Milligram(s) SUB-Q Once a Week (Patient not taking: Reported on 04/27/2022)     No current facility-administered medications for this visit.    Allergies:   Patient has no known allergies.   Past Medical History:  Diagnosis Date   CAD (coronary artery disease)    a. LHC (12/19/13):  mid LAD 50-60%, EF 55%, patent CFX and RCA - med Rx.   Cancer of kidney -status post nephrectomy    Kidney cancer 1999   Chronic neck pain    Chronic pain syndrome    Diabetes mellitus    Family history of long QT syndrome    Fibromyalgia    GERD (gastroesophageal reflux disease)    Hx of echocardiogram    a.  Echo (12/2013):  mod LVH, EF 55-60%, no RWMA, mild LAE   Hyperlipidemia    Hypertension    Insomnia    Sleep apnea    SVT (supraventricular tachycardia)    Documentation pending but occurring during the stress test with Dr. 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 by Dr Lovena Le    Past Surgical History:  Procedure Laterality Date   ABLATION  08/27/13   AVNRT ablatin by Dr Lovena Le   CHOLECYSTECTOMY     2000   elbow surgery     left   INNER EAR SURGERY     R ear   LEFT HEART CATHETERIZATION WITH CORONARY ANGIOGRAM N/A 12/19/2013   Procedure: LEFT HEART CATHETERIZATION WITH CORONARY ANGIOGRAM;  Surgeon: Sinclair Grooms, MD;  Location: [facility] CATH LAB;  Service: Cardiovascular;  Laterality: N/A;  NASAL SINUS SURGERY     2005    NEPHRECTOMY     L---1999 Cancer   SUPRAVENTRICULAR TACHYCARDIA ABLATION N/A 08/27/2013   Procedure: SUPRAVENTRICULAR TACHYCARDIA ABLATION;  Surgeon: Evans Lance, MD;  Location: [facility] CATH LAB;  Service: Cardiovascular;  Laterality: N/A;   TONSILLECTOMY     08/1988   UVULOPALATOPHARYNGOPLASTY     08/1988     Social History:  The patient  reports that he quit smoking about 24 years ago. His smoking use included cigarettes. He has a 50.00 pack-year smoking history. He has never used smokeless tobacco. He reports current alcohol use. He reports that he does not use drugs.   Family History:  The patient's family history includes Colon polyps in his father; Diabetes in his father and mother; Hyperlipidemia in his father; Hypertension in his father and mother.    ROS:  Please see the history of present illness. All other systems are reviewed and  Negative to the above problem except as noted.    PHYSICAL EXAM: VS:  There were no vitals taken for this visit.  GEN  Obese 66 yo  in no acute distress HEENT: normal Neck: no JVD, no carotid bruits Cardiac: RRR; II/VI systolic murmur LSB    No  LE  edema  Respiratory:  clear to auscultation bilaterally GI: soft, nontender, nondistended, + BS  No hepatomegaly  MS: no deformity Moving all extremities   Skin: warm and dry, no rash Neuro:  Strength and sensation are intact Psych: euthymic mood, full affect   EKG:  EKG is not ordered    Zio patch Sept 2023 Predominant rhythm:  SInus rhythm     Rates 46 to 88 bpm  Average HR 61 bpm Atrial fib/flutter occurred 3% time    Max HR 152 bpm   Average HR 61 bpm Rare PVC Triggered events mostly occurred with atrial fib with RVR   One episode occurred with SR with PVC   Patient had a min HR of 46 bpm, max HR of 152 bpm, and avg HR of 61 bpm. Predominant underlying rhythm was Sinus Rhythm. Atrial Fibrillation/Flutter occurred (3% burden), ranging from 55-152 bpm (avg of 87 bpm), the longest lasting 8  hours 13 mins with  an avg rate of 85 bpm. Atrial Fibrillation/Flutter was detected within +/- 45 seconds of symptomatic patient event(s). Isolated SVEs were rare (<1.0%), SVE Couplets were rare (<1.0%), and SVE Triplets were rare (<1.0%). Isolated VEs were rare (<1.0%,  378), VE Couplets were rare (<1.0%, 12), and VE Triplets were rare (<1.0%, 3).   Echo   JUly 2023   1. Left ventricular ejection fraction, by estimation, is 60 to 65%. The  left ventricle has normal function. The left ventricle has no regional  wall motion abnormalities. There is mild concentric left ventricular  hypertrophy. Left ventricular diastolic  parameters are consistent with Grade II diastolic dysfunction  (pseudonormalization). The average left ventricular global longitudinal  strain is -26.7 %. The global longitudinal strain is normal.   2. Right ventricular systolic function is normal. The right ventricular  size is mildly enlarged. There is normal pulmonary artery systolic  pressure.   3. Left atrial size was moderately dilated.   4. Right atrial size was mild to moderately dilated.   5. The mitral valve is normal in structure. Trivial mitral valve  regurgitation. No evidence of mitral stenosis.   6. The aortic valve is tricuspid. There is mild calcification of the  aortic valve. Aortic valve regurgitation  is not visualized. Aortic valve  sclerosis is present, with no evidence of aortic valve stenosis.   7. The inferior vena cava is normal in size with greater than 50%  respiratory variability, suggesting right atrial pressure of 3 mmHg.   Comparison(s): No significant change from prior study. Lipid Panel    Component Value Date/Time   CHOL 118 12/28/2021 1649   TRIG 570 (HH) 12/28/2021 1649   HDL 21 (L) 12/28/2021 1649   CHOLHDL 5.6 (H) 12/28/2021 1649   CHOLHDL 6.4 12/19/2013 0400   VLDL UNABLE TO CALCULATE IF TRIGLYCERIDE OVER 400 mg/dL 12/19/2013 0400   LDLCALC 21 12/28/2021 1649   LDLDIRECT 71.5  10/19/2011 1057      Wt Readings from Last 3 Encounters:  05/25/22 234 lb (106.1 kg)  04/27/22 237 lb 3.2 oz (107.6 kg)  03/21/22 232 lb (105.2 kg)      ASSESSMENT AND PLAN:  1. Atrial fibrillation   This is new for patient  His CHADSVASc score is 3  Will rx Eliquis     He is very fatgued on carvedidlol 37.5 bid   WIll back down to 25 bid and follow   I will review with EP antiarrhythmic     2  CAD  Nonobstructive by cath in 2007     Pt denies CP       3  HTN  BP is excellent   Follow as change meds   4.  HL   Will check lipids again today   5  OSA   Will set up for sleep study      6  DM ]A1C 7.9   Discussed diet      Signed, Dorris Carnes, MD  06/06/2022 6:17 PM    [address], [place], [place]  [address] Phone: [phone]; Fax: [phone]    Cardiology Office Note   Date:  06/06/2022   ID:  ROYE GUSTAFSON, DOB 10-01-1955, MRN [id number]  PCP:  Maryland Pink, MD  Cardiologist:   Dorris Carnes, MD   Pt presents for r/u of CAD    History of Present Illness: Jesse Vargas is a 66 y.o. male with a history of mild CAD by cath in 2007. Also a history of HTN, SVT(s/p ablation for AVNRT in 2015)  M , HL, sleep apnea. Myoview in 2012 done after seen. This was normal.  In 2015 he had CP that lead to [facility].  This showed 50 to 60% LAD lesion  Pt also with hx of SVT and is s/p ablation in 2015 I saw him in JUne 2023   Diet at time: Diet:   Breakfast   coffee with cream; sausage roll up.   Lunch   hamburger or hot dog Or salad or gyro Dinner   Meat, vegg  BP wa high  I recomm increasing amlodipine to 5 mg BID   Also sched patient for echo   LVEF wa normal  Gr II diastolic dysfunction     He was seen in September 2023 for palpitations, fatigue   The patientnoticed while working in yard.    HR 180s then down to 120s .  Zio patch ordered and showed atrial fibrillation 3% of time    Carvedilol dose increased    with improvement of  symptoms      Since then he denies CP  says he has fatigue.  Denies dizziness  Has hx of sleep apnea.   Has been  awhile since had evaluation.     Current Outpatient Medications  Medication Sig Dispense Refill   ALPRAZolam (XANAX XR) 2 MG 24 hr tablet Take 2 mg by mouth at bedtime.     amLODipine (NORVASC) 10 MG tablet Take 10 mg by mouth in the morning and at bedtime.     amLODipine (NORVASC) 5 MG tablet Take 1 tablet (5 mg total) by mouth in the morning and at bedtime. TAKE 1 TABLET BY MOUTH IN  THE MORNING AND  IN THE  EVENING. (Patient not taking: Reported on 04/27/2022) 180 tablet 1   apixaban (ELIQUIS) 5 MG TABS tablet Take 1 tablet (5 mg total) by mouth 2 (two) times daily. 180 tablet 3   aspirin EC 81 MG tablet Take 81 mg by mouth daily.     atorvastatin (LIPITOR) 40 MG tablet TAKE 1 TABLET BY MOUTH  DAILY AT 6 PM 30 tablet 0   carvedilol (COREG) 25 MG tablet Take 1 tablet (25 mg total) by mouth 2 (two) times daily. 180 tablet 3   cyclobenzaprine (FLEXERIL) 10 MG tablet Take 1 tablet at bedtime as needed for muscle spasms 30 tablet 5   dronedarone (MULTAQ) 400 MG tablet Take 1 tablet (400 mg total) by mouth 2 (two) times daily with a meal. 180 tablet 3   Dulaglutide 1.5 MG/0.5ML SOPN Inject into the skin. (Patient not taking: Reported on 04/27/2022)     ergocalciferol (VITAMIN D2) 1.25 MG (50000 UT) capsule Take 1 capsule by mouth once a week.     escitalopram (LEXAPRO) 20 MG tablet Take 20 mg by mouth daily.     ezetimibe (ZETIA) 10 MG tablet Take 1 tablet (10 mg total) by mouth daily. Please keep upcoming appointment for future refills thank you. 90 tablet 0   fenofibrate 160 MG tablet Take 1 tablet by mouth  daily 30 tablet 0   FLUoxetine (PROZAC) 40 MG capsule Take 1 capsule by mouth  daily 30 capsule 0   fluticasone (FLONASE) 50 MCG/ACT nasal spray Place 2 sprays into both nostrils daily. 48 g 1   gabapentin (NEURONTIN) 300 MG capsule Take 1 capsule by mouth 3  times daily (Patient  not taking: Reported on 04/27/2022) 90 capsule 0   glipiZIDE (GLUCOTROL XL) 2.5 MG 24 hr tablet Take 4 tablets by mouth daily.     icosapent Ethyl (VASCEPA) 1 g capsule TAKE 2 CAPSULES(2 GRAMS) BY MOUTH TWICE DAILY 360 capsule 3   losartan-hydrochlorothiazide (HYZAAR) 100-25 MG tablet Take 1 tablet by mouth  daily 30 tablet 0   meloxicam (MOBIC) 15 MG tablet Take 1 tablet by mouth  daily 90 tablet 3   metFORMIN (GLUCOPHAGE) 1000 MG tablet Take 1500 mg with breakfast and 1000 mg with dinner. Do not exceed 2500 mg per day (Patient taking differently: 1,000 mg in the morning and at bedtime. Take 1500 mg with breakfast and 1000 mg with dinner. Do not exceed 2500 mg per day) 225 tablet 2   nitroGLYCERIN (NITROLINGUAL) 0.4 MG/SPRAY spray Place 1 spray under the tongue every 5 (five) minutes x 3 doses as needed for chest pain. 12 g 12   olmesartan-hydrochlorothiazide (BENICAR HCT) 40-25 MG tablet Take 1 tablet by mouth daily.     omeprazole (PRILOSEC) 20 MG capsule Take 1 capsule by mouth daily.     pantoprazole (PROTONIX) 20 MG tablet Take 1 tablet by mouth two  times daily (Patient not taking: Reported on 04/27/2022) 180 tablet 1   PROAIR HFA 108 (90 BASE)  MCG/ACT inhaler INHALE 2 PUFFS INTO THE  LUNGS 4 TIMES DAILY AS  NEEDED FOR WHEEZING. 17 g 3   tadalafil (CIALIS) 20 MG tablet Take 20 mg by mouth daily as needed.     TRULICITY 3 ZY/6.0YT SOPN SMARTSIG:3 Milligram(s) SUB-Q Once a Week (Patient not taking: Reported on 04/27/2022)     No current facility-administered medications for this visit.    Allergies:   Patient has no known allergies.   Past Medical History:  Diagnosis Date   CAD (coronary artery disease)    a. LHC (12/19/13):  mid LAD 50-60%, EF 55%, patent CFX and RCA - med Rx.   Cancer of kidney -status post nephrectomy    Kidney cancer 1999   Chronic neck pain    Chronic pain syndrome    Diabetes mellitus    Family history of long QT syndrome    Fibromyalgia    GERD  (gastroesophageal reflux disease)    Hx of echocardiogram    a.  Echo (12/2013):  mod LVH, EF 55-60%, no RWMA, mild LAE   Hyperlipidemia    Hypertension    Insomnia    Sleep apnea    SVT (supraventricular tachycardia)    Documentation pending but occurring during the stress test with Dr. 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 by Dr Lovena Le    Past Surgical History:  Procedure Laterality Date   ABLATION  08/27/13   AVNRT ablatin by Dr Lovena Le   CHOLECYSTECTOMY     2000   elbow surgery     left   INNER EAR SURGERY     R ear   LEFT HEART CATHETERIZATION WITH CORONARY ANGIOGRAM N/A 12/19/2013   Procedure: LEFT HEART CATHETERIZATION WITH CORONARY ANGIOGRAM;  Surgeon: Sinclair Grooms, MD;  Location: [facility] CATH LAB;  Service: Cardiovascular;  Laterality: N/A;   NASAL SINUS SURGERY     2005   NEPHRECTOMY     L---1999 Cancer   SUPRAVENTRICULAR TACHYCARDIA ABLATION N/A 08/27/2013   Procedure: SUPRAVENTRICULAR TACHYCARDIA ABLATION;  Surgeon: Evans Lance, MD;  Location: [facility] CATH LAB;  Service: Cardiovascular;  Laterality: N/A;   TONSILLECTOMY     08/1988   UVULOPALATOPHARYNGOPLASTY     08/1988     Social History:  The patient  reports that he quit smoking about 24 years ago. His smoking use included cigarettes. He has a 50.00 pack-year smoking history. He has never used smokeless tobacco. He reports current alcohol use. He reports that he does not use drugs.   Family History:  The patient's family history includes Colon polyps in his father; Diabetes in his father and mother; Hyperlipidemia in his father; Hypertension in his father and mother.    ROS:  Please see the history of present illness. All other systems are reviewed and  Negative to the above problem except as noted.    PHYSICAL EXAM: VS:  There were no vitals taken for this visit.  GEN  Obese 66 yo  in no acute distress HEENT: normal Neck: no JVD, no carotid bruits Cardiac: RRR; II/VI systolic murmur LSB    No  LE  edema   Respiratory:  clear to auscultation bilaterally GI: soft, nontender, nondistended, + BS  No hepatomegaly  MS: no deformity Moving all extremities   Skin: warm and dry, no rash Neuro:  Strength and sensation are intact Psych: euthymic mood, full affect   EKG:  EKG is not ordered    Zio patch Sept 2023 Predominant rhythm:  SInus rhythm  Rates 46 to 88 bpm  Average HR 61 bpm Atrial fib/flutter occurred 3% time    Max HR 152 bpm   Average HR 61 bpm Rare PVC Triggered events mostly occurred with atrial fib with RVR   One episode occurred with SR with PVC   Patient had a min HR of 46 bpm, max HR of 152 bpm, and avg HR of 61 bpm. Predominant underlying rhythm was Sinus Rhythm. Atrial Fibrillation/Flutter occurred (3% burden), ranging from 55-152 bpm (avg of 87 bpm), the longest lasting 8 hours 13 mins with  an avg rate of 85 bpm. Atrial Fibrillation/Flutter was detected within +/- 45 seconds of symptomatic patient event(s). Isolated SVEs were rare (<1.0%), SVE Couplets were rare (<1.0%), and SVE Triplets were rare (<1.0%). Isolated VEs were rare (<1.0%,  378), VE Couplets were rare (<1.0%, 12), and VE Triplets were rare (<1.0%, 3).   Echo   JUly 2023   1. Left ventricular ejection fraction, by estimation, is 60 to 65%. The  left ventricle has normal function. The left ventricle has no regional  wall motion abnormalities. There is mild concentric left ventricular  hypertrophy. Left ventricular diastolic  parameters are consistent with Grade II diastolic dysfunction  (pseudonormalization). The average left ventricular global longitudinal  strain is -26.7 %. The global longitudinal strain is normal.   2. Right ventricular systolic function is normal. The right ventricular  size is mildly enlarged. There is normal pulmonary artery systolic  pressure.   3. Left atrial size was moderately dilated.   4. Right atrial size was mild to moderately dilated.   5. The mitral valve is normal in  structure. Trivial mitral valve  regurgitation. No evidence of mitral stenosis.   6. The aortic valve is tricuspid. There is mild calcification of the  aortic valve. Aortic valve regurgitation is not visualized. Aortic valve  sclerosis is present, with no evidence of aortic valve stenosis.   7. The inferior vena cava is normal in size with greater than 50%  respiratory variability, suggesting right atrial pressure of 3 mmHg.   Comparison(s): No significant change from prior study. Lipid Panel    Component Value Date/Time   CHOL 118 12/28/2021 1649   TRIG 570 (HH) 12/28/2021 1649   HDL 21 (L) 12/28/2021 1649   CHOLHDL 5.6 (H) 12/28/2021 1649   CHOLHDL 6.4 12/19/2013 0400   VLDL UNABLE TO CALCULATE IF TRIGLYCERIDE OVER 400 mg/dL 12/19/2013 0400   LDLCALC 21 12/28/2021 1649   LDLDIRECT 71.5 10/19/2011 1057      Wt Readings from Last 3 Encounters:  05/25/22 234 lb (106.1 kg)  04/27/22 237 lb 3.2 oz (107.6 kg)  03/21/22 232 lb (105.2 kg)      ASSESSMENT AND PLAN:  1. Atrial fibrillation   This is new for patient  His CHADSVASc score is 3  Will rx Eliquis     He is very fatgued on carvedidlol 37.5 bid   WIll back down to 25 bid and follow   I will review with EP antiarrhythmic     2  CAD  Nonobstructive by cath in 2007     Pt denies CP       3  HTN  BP is excellent   Follow as change meds   4.  HL   Will check lipids again today   5  OSA   Will set up for sleep study      6  DM ]A1C 7.9   Discussed diet  Signed, Dorris Carnes, MD  06/06/2022 6:17 PM    [address], [place], [place]  [address] Phone: [phone]; Fax: [phone]

## 2022-06-07 ENCOUNTER — Other Ambulatory Visit: Payer: Self-pay

## 2022-06-07 ENCOUNTER — Encounter: Payer: Self-pay | Admitting: Internal Medicine

## 2022-06-07 ENCOUNTER — Observation Stay
Admission: EM | Admit: 2022-06-07 | Discharge: 2022-06-09 | Disposition: A | Discharge status: Home / Self Care | Payer: Managed Care, Other (non HMO) | Attending: Internal Medicine | Admitting: Internal Medicine

## 2022-06-07 ENCOUNTER — Emergency Department: Payer: Managed Care, Other (non HMO)

## 2022-06-07 DIAGNOSIS — E785 Hyperlipidemia, unspecified: Secondary | ICD-10-CM | POA: Insufficient documentation

## 2022-06-07 DIAGNOSIS — I10 Essential (primary) hypertension: Secondary | ICD-10-CM | POA: Insufficient documentation

## 2022-06-07 DIAGNOSIS — E669 Obesity, unspecified: Secondary | ICD-10-CM | POA: Diagnosis present

## 2022-06-07 DIAGNOSIS — Z87891 Personal history of nicotine dependence: Secondary | ICD-10-CM | POA: Diagnosis not present

## 2022-06-07 DIAGNOSIS — Z7984 Long term (current) use of oral hypoglycemic drugs: Secondary | ICD-10-CM | POA: Insufficient documentation

## 2022-06-07 DIAGNOSIS — E1121 Type 2 diabetes mellitus with diabetic nephropathy: Secondary | ICD-10-CM | POA: Diagnosis present

## 2022-06-07 DIAGNOSIS — F418 Other specified anxiety disorders: Secondary | ICD-10-CM | POA: Insufficient documentation

## 2022-06-07 DIAGNOSIS — E119 Type 2 diabetes mellitus without complications: Secondary | ICD-10-CM | POA: Diagnosis not present

## 2022-06-07 DIAGNOSIS — I480 Paroxysmal atrial fibrillation: Secondary | ICD-10-CM | POA: Diagnosis not present

## 2022-06-07 DIAGNOSIS — Z79899 Other long term (current) drug therapy: Secondary | ICD-10-CM | POA: Insufficient documentation

## 2022-06-07 DIAGNOSIS — W19XXXA Unspecified fall, initial encounter: Secondary | ICD-10-CM | POA: Insufficient documentation

## 2022-06-07 DIAGNOSIS — Z905 Acquired absence of kidney: Secondary | ICD-10-CM | POA: Insufficient documentation

## 2022-06-07 DIAGNOSIS — F341 Dysthymic disorder: Secondary | ICD-10-CM | POA: Diagnosis present

## 2022-06-07 DIAGNOSIS — R296 Repeated falls: Secondary | ICD-10-CM | POA: Insufficient documentation

## 2022-06-07 DIAGNOSIS — Z7901 Long term (current) use of anticoagulants: Secondary | ICD-10-CM | POA: Diagnosis not present

## 2022-06-07 DIAGNOSIS — I2510 Atherosclerotic heart disease of native coronary artery without angina pectoris: Secondary | ICD-10-CM | POA: Diagnosis not present

## 2022-06-07 DIAGNOSIS — J111 Influenza due to unidentified influenza virus with other respiratory manifestations: Principal | ICD-10-CM

## 2022-06-07 DIAGNOSIS — D696 Thrombocytopenia, unspecified: Secondary | ICD-10-CM

## 2022-06-07 DIAGNOSIS — R531 Weakness: Secondary | ICD-10-CM

## 2022-06-07 DIAGNOSIS — Z7982 Long term (current) use of aspirin: Secondary | ICD-10-CM | POA: Insufficient documentation

## 2022-06-07 DIAGNOSIS — N179 Acute kidney failure, unspecified: Secondary | ICD-10-CM | POA: Diagnosis not present

## 2022-06-07 DIAGNOSIS — R4182 Altered mental status, unspecified: Secondary | ICD-10-CM | POA: Diagnosis present

## 2022-06-07 DIAGNOSIS — E876 Hypokalemia: Secondary | ICD-10-CM | POA: Insufficient documentation

## 2022-06-07 DIAGNOSIS — R2681 Unsteadiness on feet: Secondary | ICD-10-CM | POA: Insufficient documentation

## 2022-06-07 DIAGNOSIS — Z85528 Personal history of other malignant neoplasm of kidney: Secondary | ICD-10-CM | POA: Diagnosis not present

## 2022-06-07 DIAGNOSIS — K219 Gastro-esophageal reflux disease without esophagitis: Secondary | ICD-10-CM | POA: Insufficient documentation

## 2022-06-07 DIAGNOSIS — Z1152 Encounter for screening for COVID-19: Secondary | ICD-10-CM | POA: Diagnosis not present

## 2022-06-07 DIAGNOSIS — G4733 Obstructive sleep apnea (adult) (pediatric): Secondary | ICD-10-CM | POA: Insufficient documentation

## 2022-06-07 DIAGNOSIS — Z8679 Personal history of other diseases of the circulatory system: Secondary | ICD-10-CM

## 2022-06-07 DIAGNOSIS — Z6834 Body mass index (BMI) 34.0-34.9, adult: Secondary | ICD-10-CM | POA: Insufficient documentation

## 2022-06-07 DIAGNOSIS — J101 Influenza due to other identified influenza virus with other respiratory manifestations: Secondary | ICD-10-CM | POA: Diagnosis not present

## 2022-06-07 DIAGNOSIS — C649 Malignant neoplasm of unspecified kidney, except renal pelvis: Secondary | ICD-10-CM | POA: Diagnosis present

## 2022-06-07 DIAGNOSIS — Z6839 Body mass index (BMI) 39.0-39.9, adult: Secondary | ICD-10-CM | POA: Diagnosis not present

## 2022-06-07 DIAGNOSIS — G894 Chronic pain syndrome: Secondary | ICD-10-CM | POA: Insufficient documentation

## 2022-06-07 DIAGNOSIS — M797 Fibromyalgia: Secondary | ICD-10-CM | POA: Insufficient documentation

## 2022-06-07 LAB — COMPREHENSIVE METABOLIC PANEL
ALT: 41 U/L (ref 0–44)
AST: 51 U/L — ABNORMAL HIGH (ref 15–41)
Albumin: 3.9 g/dL (ref 3.5–5.0)
Alkaline Phosphatase: 24 U/L — ABNORMAL LOW (ref 38–126)
Anion gap: 11 (ref 5–15)
BUN: 24 mg/dL — ABNORMAL HIGH (ref 8–23)
CO2: 23 mmol/L (ref 22–32)
Calcium: 9.4 mg/dL (ref 8.9–10.3)
Chloride: 103 mmol/L (ref 98–111)
Creatinine, Ser: 1.84 mg/dL — ABNORMAL HIGH (ref 0.61–1.24)
GFR, Estimated: 40 mL/min — ABNORMAL LOW (ref >60)
Glucose, Bld: 108 mg/dL — ABNORMAL HIGH (ref 70–99)
Potassium: 3.4 mmol/L — ABNORMAL LOW (ref 3.5–5.1)
Sodium: 137 mmol/L (ref 135–145)
Total Bilirubin: 0.6 mg/dL (ref 0.3–1.2)
Total Protein: 7 g/dL (ref 6.5–8.1)

## 2022-06-07 LAB — MAGNESIUM: Magnesium: 1.6 mg/dL — ABNORMAL LOW (ref 1.7–2.4)

## 2022-06-07 LAB — CBC
HCT: 42.3 % (ref 39.0–52.0)
Hemoglobin: 14 g/dL (ref 13.0–17.0)
MCH: 30.6 pg (ref 26.0–34.0)
MCHC: 33.1 g/dL (ref 30.0–36.0)
MCV: 92.6 fL (ref 80.0–100.0)
Platelets: 139 10*3/uL — ABNORMAL LOW (ref 150–400)
RBC: 4.57 MIL/uL (ref 4.22–5.81)
RDW: 12.8 % (ref 11.5–15.5)
WBC: 8.5 10*3/uL (ref 4.0–10.5)
nRBC: 0 % (ref 0.0–0.2)

## 2022-06-07 LAB — RESP PANEL BY RT-PCR (FLU A&B, COVID) ARPGX2
Influenza A by PCR: POSITIVE — AB
Influenza B by PCR: NEGATIVE
SARS Coronavirus 2 by RT PCR: NEGATIVE

## 2022-06-07 LAB — CBG MONITORING, ED: Glucose-Capillary: 132 mg/dL — ABNORMAL HIGH (ref 70–99)

## 2022-06-07 LAB — PROTIME-INR
INR: 1.4 — ABNORMAL HIGH (ref 0.8–1.2)
Prothrombin Time: 16.6 seconds — ABNORMAL HIGH (ref 11.4–15.2)

## 2022-06-07 LAB — TROPONIN I (HIGH SENSITIVITY): Troponin I (High Sensitivity): 30 ng/L — ABNORMAL HIGH (ref <18)

## 2022-06-07 MED ORDER — APIXABAN 5 MG PO TABS
5.0000 mg | ORAL_TABLET | Freq: Two times a day (BID) | ORAL | Status: DC
  Administered 2022-06-07 – 2022-06-09 (×4): 5 mg via ORAL
  Filled 2022-06-07 (×4): qty 1

## 2022-06-07 MED ORDER — FENOFIBRATE 160 MG PO TABS
160.0000 mg | ORAL_TABLET | Freq: Every day | ORAL | Status: DC
  Administered 2022-06-08 – 2022-06-09 (×2): 160 mg via ORAL
  Filled 2022-06-07 (×2): qty 1

## 2022-06-07 MED ORDER — OSELTAMIVIR PHOSPHATE 75 MG PO CAPS
75.0000 mg | ORAL_CAPSULE | Freq: Once | ORAL | Status: AC
  Administered 2022-06-07: 75 mg via ORAL
  Filled 2022-06-07: qty 1

## 2022-06-07 MED ORDER — CARVEDILOL 25 MG PO TABS
25.0000 mg | ORAL_TABLET | Freq: Two times a day (BID) | ORAL | Status: DC
  Administered 2022-06-08 – 2022-06-09 (×3): 25 mg via ORAL
  Filled 2022-06-07 (×2): qty 1
  Filled 2022-06-07: qty 4

## 2022-06-07 MED ORDER — NITROGLYCERIN 0.4 MG SL SUBL: 0.4000 mg | SUBLINGUAL_TABLET | SUBLINGUAL | Status: DC | PRN

## 2022-06-07 MED ORDER — ACETAMINOPHEN 325 MG PO TABS
650.0000 mg | ORAL_TABLET | Freq: Four times a day (QID) | ORAL | Status: DC | PRN
  Administered 2022-06-07: 650 mg via ORAL
  Filled 2022-06-07: qty 2

## 2022-06-07 MED ORDER — POTASSIUM CHLORIDE CRYS ER 20 MEQ PO TBCR
20.0000 meq | EXTENDED_RELEASE_TABLET | Freq: Once | ORAL | Status: AC
  Administered 2022-06-07: 20 meq via ORAL
  Filled 2022-06-07: qty 1

## 2022-06-07 MED ORDER — ICOSAPENT ETHYL 1 G PO CAPS
2.0000 g | ORAL_CAPSULE | Freq: Two times a day (BID) | ORAL | Status: DC
  Administered 2022-06-07 – 2022-06-09 (×4): 2 g via ORAL
  Filled 2022-06-07 (×4): qty 2

## 2022-06-07 MED ORDER — SODIUM CHLORIDE 0.9 % IV SOLN: INTRAVENOUS | Status: DC

## 2022-06-07 MED ORDER — FLUOXETINE HCL 40 MG PO CAPS: 40.0000 mg | ORAL_CAPSULE | Freq: Every day | ORAL | Status: DC

## 2022-06-07 MED ORDER — ESCITALOPRAM OXALATE 10 MG PO TABS
20.0000 mg | ORAL_TABLET | Freq: Every day | ORAL | Status: DC
  Administered 2022-06-08 – 2022-06-09 (×2): 20 mg via ORAL
  Filled 2022-06-07 (×2): qty 2

## 2022-06-07 MED ORDER — ACETAMINOPHEN 650 MG RE SUPP: 650.0000 mg | Freq: Four times a day (QID) | RECTAL | Status: DC | PRN

## 2022-06-07 MED ORDER — OSELTAMIVIR PHOSPHATE 30 MG PO CAPS
30.0000 mg | ORAL_CAPSULE | Freq: Two times a day (BID) | ORAL | Status: DC
  Administered 2022-06-08 – 2022-06-09 (×3): 30 mg via ORAL
  Filled 2022-06-07 (×3): qty 1

## 2022-06-07 MED ORDER — LACTATED RINGERS IV BOLUS
1000.0000 mL | Freq: Once | INTRAVENOUS | Status: AC
  Administered 2022-06-07: 1000 mL via INTRAVENOUS

## 2022-06-07 MED ORDER — INSULIN ASPART 100 UNIT/ML IJ SOLN: 0.0000 [IU] | Freq: Every day | INTRAMUSCULAR | Status: DC

## 2022-06-07 MED ORDER — ONDANSETRON HCL 4 MG/2ML IJ SOLN: 4.0000 mg | Freq: Four times a day (QID) | INTRAMUSCULAR | Status: DC | PRN

## 2022-06-07 MED ORDER — MAGNESIUM SULFATE 2 GM/50ML IV SOLN
2.0000 g | Freq: Once | INTRAVENOUS | Status: AC
  Administered 2022-06-07: 2 g via INTRAVENOUS
  Filled 2022-06-07: qty 50

## 2022-06-07 MED ORDER — PANTOPRAZOLE SODIUM 40 MG PO TBEC
40.0000 mg | DELAYED_RELEASE_TABLET | Freq: Every day | ORAL | Status: DC
  Administered 2022-06-08 – 2022-06-09 (×2): 40 mg via ORAL
  Filled 2022-06-07 (×2): qty 1

## 2022-06-07 MED ORDER — ATORVASTATIN CALCIUM 20 MG PO TABS
40.0000 mg | ORAL_TABLET | Freq: Every day | ORAL | Status: DC
  Administered 2022-06-08 – 2022-06-09 (×2): 40 mg via ORAL
  Filled 2022-06-07 (×2): qty 2

## 2022-06-07 MED ORDER — ONDANSETRON HCL 4 MG PO TABS: 4.0000 mg | ORAL_TABLET | Freq: Four times a day (QID) | ORAL | Status: DC | PRN

## 2022-06-07 MED ORDER — INSULIN ASPART 100 UNIT/ML IJ SOLN
0.0000 [IU] | Freq: Three times a day (TID) | INTRAMUSCULAR | Status: DC
  Administered 2022-06-08: 2 [IU] via SUBCUTANEOUS
  Administered 2022-06-09: 1 [IU] via SUBCUTANEOUS
  Filled 2022-06-07 (×2): qty 1

## 2022-06-07 MED ORDER — DRONEDARONE HCL 400 MG PO TABS
400.0000 mg | ORAL_TABLET | Freq: Two times a day (BID) | ORAL | Status: DC
  Administered 2022-06-07 – 2022-06-09 (×3): 400 mg via ORAL
  Filled 2022-06-07 (×6): qty 1

## 2022-06-07 MED ORDER — EZETIMIBE 10 MG PO TABS
10.0000 mg | ORAL_TABLET | Freq: Every day | ORAL | Status: DC
  Administered 2022-06-08 – 2022-06-09 (×2): 10 mg via ORAL
  Filled 2022-06-07 (×2): qty 1

## 2022-06-07 MED ORDER — ALPRAZOLAM ER 1 MG PO TB24
2.0000 mg | ORAL_TABLET | Freq: Every day | ORAL | Status: DC
  Administered 2022-06-07 – 2022-06-08 (×2): 2 mg via ORAL
  Filled 2022-06-07 (×2): qty 2

## 2022-06-07 MED ORDER — VITAMIN D (ERGOCALCIFEROL) 1.25 MG (50000 UNIT) PO CAPS
50000.0000 [IU] | ORAL_CAPSULE | ORAL | Status: DC
  Administered 2022-06-07: 50000 [IU] via ORAL
  Filled 2022-06-07: qty 1

## 2022-06-07 MED ORDER — ASPIRIN 81 MG PO TBEC: 81.0000 mg | DELAYED_RELEASE_TABLET | Freq: Every day | ORAL | Status: DC

## 2022-06-07 NOTE — Assessment & Plan Note (Signed)
Continue Lexapro

## 2022-06-07 NOTE — H&P (Signed)
History and Physical    Patient: Jesse Vargas OEV:[id number] DOB: 1955/12/04 DOA: 06/07/2022 DOS: the patient was seen and examined on 06/07/2022 PCP: Maryland Pink, MD  Patient coming from: Home  Chief Complaint:  Chief Complaint  Patient presents with   Fall   Altered Mental Status   HPI: Jesse Vargas is a 66 y.o. male with medical history significant of CAD, kidney cancer status post left nephrectomy, chronic pain, type 2 diabetes mellitus, fibromyalgia, GERD, hyperlipidemia, hypertension, insomnia, sleep apnea and SVT.  Patient presents to the hospital with being sick around 2 days ago.  Started off where he could not taste.  He is having some pain in his abdomen more of a muscular pain.  He has been feeling very weak.  He cannot steady himself.  He had 3 times falling to the floor and 1 time he could not make it to the bathroom in time.  He had a high fever of 103.  In the ER he was found to have influenza A and low magnesium and low potassium and acute kidney injury.  Hospitalist services were contacted for further evaluation Review of Systems: Review of Systems  Constitutional:  Positive for chills, fever and malaise/fatigue.  HENT:  Negative for hearing loss.   Eyes:  Negative for blurred vision.  Respiratory:  Positive for cough.   Cardiovascular:  Negative for chest pain.  Gastrointestinal:  Positive for abdominal pain. Negative for nausea and vomiting.  Genitourinary:  Negative for dysuria.  Musculoskeletal:  Positive for myalgias.  Skin:  Negative for rash.  Neurological:  Positive for weakness.  Endo/Heme/Allergies:  Does not bruise/bleed easily.  Psychiatric/Behavioral:  Positive for depression.     Past Medical History:  Diagnosis Date   CAD (coronary artery disease)    a. LHC (12/19/13):  mid LAD 50-60%, EF 55%, patent CFX and RCA - med Rx.   Cancer of kidney -status post nephrectomy    Kidney cancer 1999   Chronic neck pain    Chronic pain syndrome     Diabetes mellitus    Family history of long QT syndrome    Fibromyalgia    GERD (gastroesophageal reflux disease)    Hx of echocardiogram    a.  Echo (12/2013):  mod LVH, EF 55-60%, no RWMA, mild LAE   Hyperlipidemia    Hypertension    Insomnia    Sleep apnea    SVT (supraventricular tachycardia)    Documentation pending but occurring during the stress test with Dr. 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 by Dr Lovena Le   Past Surgical History:  Procedure Laterality Date   ABLATION  08/27/13   AVNRT ablatin by Dr Lovena Le   CHOLECYSTECTOMY     2000   elbow surgery     left   INNER EAR SURGERY     R ear   LEFT HEART CATHETERIZATION WITH CORONARY ANGIOGRAM N/A 12/19/2013   Procedure: LEFT HEART CATHETERIZATION WITH CORONARY ANGIOGRAM;  Surgeon: Sinclair Grooms, MD;  Location: [facility] CATH LAB;  Service: Cardiovascular;  Laterality: N/A;   NASAL SINUS SURGERY     2005   NEPHRECTOMY     L---1999 Cancer   SUPRAVENTRICULAR TACHYCARDIA ABLATION N/A 08/27/2013   Procedure: SUPRAVENTRICULAR TACHYCARDIA ABLATION;  Surgeon: Evans Lance, MD;  Location: [facility] CATH LAB;  Service: Cardiovascular;  Laterality: N/A;   TONSILLECTOMY     08/1988   UVULOPALATOPHARYNGOPLASTY     08/1988   Social History:  reports that he quit smoking  about 24 years ago. His smoking use included cigarettes. He has a 50.00 pack-year smoking history. He has never used smokeless tobacco. He reports current alcohol use. He reports that he does not use drugs.  No Known Allergies  Family History  Problem Relation Age of Onset   Diabetes Father    Hypertension Father    Hyperlipidemia Father    Colon polyps Father    Hypertension Mother    Diabetes Mother    Colon cancer Neg Hx    Stomach cancer Neg Hx     Prior to Admission medications   Medication Sig Start Date End Date Taking? Authorizing Provider  apixaban (ELIQUIS) 5 MG TABS tablet Take 1 tablet (5 mg total) by mouth 2 (two) times daily. 04/27/22  Yes Fay Records, MD   atorvastatin (LIPITOR) 40 MG tablet TAKE 1 TABLET BY MOUTH  DAILY AT 6 PM 03/30/16  Yes Cook, Jayce G, DO  carvedilol (COREG) 25 MG tablet Take 1 tablet (25 mg total) by mouth 2 (two) times daily. 04/27/22  Yes Fay Records, MD  dronedarone (MULTAQ) 400 MG tablet Take 1 tablet (400 mg total) by mouth 2 (two) times daily with a meal. 05/04/22  Yes Fay Records, MD  ezetimibe (ZETIA) 10 MG tablet Take 1 tablet (10 mg total) by mouth daily. Please keep upcoming appointment for future refills thank you. 03/16/22  Yes Fay Records, MD  fenofibrate 160 MG tablet Take 1 tablet by mouth  daily 03/30/16  Yes Cook, Jayce G, DO  ALPRAZolam (XANAX XR) 2 MG 24 hr tablet Take 2 mg by mouth at bedtime. 01/27/22   [provider]  amLODipine (NORVASC) 10 MG tablet Take 10 mg by mouth in the morning and at bedtime. 03/22/22   [provider]  aspirin EC 81 MG tablet Take 81 mg by mouth daily.    [provider]  ergocalciferol (VITAMIN D2) 1.25 MG (50000 UT) capsule Take 1 capsule by mouth once a week. 10/11/20   [provider]  escitalopram (LEXAPRO) 20 MG tablet Take 20 mg by mouth daily. 02/16/22   [provider]  fluticasone (FLONASE) 50 MCG/ACT nasal spray Place 2 sprays into both nostrils daily. 06/10/15   Rubbie Battiest, RN  glipiZIDE (GLUCOTROL XL) 2.5 MG 24 hr tablet Take 4 tablets by mouth daily. 04/30/19   [provider]  icosapent Ethyl (VASCEPA) 1 g capsule TAKE 2 CAPSULES(2 GRAMS) BY MOUTH TWICE DAILY 01/16/22   Fay Records, MD  losartan-hydrochlorothiazide [facility]) 100-25 MG tablet Take 1 tablet by mouth  daily 03/30/16   Coral Spikes, DO  meloxicam (MOBIC) 15 MG tablet Take 1 tablet by mouth  daily 06/28/15   Crecencio Mc, MD  metFORMIN (GLUCOPHAGE) 1000 MG tablet Take 1500 mg with breakfast and 1000 mg with dinner. Do not exceed 2500 mg per day Patient taking differently: 1,000 mg in the morning and at bedtime. Take 1500 mg with breakfast and  1000 mg with dinner. Do not exceed 2500 mg per day 03/18/15   Rubbie Battiest, RN  nitroGLYCERIN (NITROLINGUAL) 0.4 MG/SPRAY spray Place 1 spray under the tongue every 5 (five) minutes x 3 doses as needed for chest pain. 12/20/13   Richardson Dopp T, PA-C  olmesartan-hydrochlorothiazide (BENICAR HCT) 40-25 MG tablet Take 1 tablet by mouth daily. 02/20/22   [provider]  omeprazole (PRILOSEC) 20 MG capsule Take 1 capsule by mouth daily. 03/16/22   [provider]  PROAIR HFA 108 (90 BASE) MCG/ACT inhaler INHALE 2 PUFFS INTO THE  LUNGS 4 TIMES DAILY AS  NEEDED FOR WHEEZING. 11/10/14   Rubbie Battiest, RN  tadalafil (CIALIS) 20 MG tablet Take 20 mg by mouth daily as needed. 03/22/22   [provider]    Physical Exam: Vitals:   06/07/22 1544 06/07/22 1845 06/07/22 2000  BP: 95/63 105/82 127/66  Pulse: (!) 53 80 60  Resp: 16 20   Temp: 98.5 F (36.9 C)    SpO2: 93% 94% 93%  Weight: 106.1 kg    Height: '5\' 9"'$  (1.753 m)     Physical Exam HENT:     Head: Normocephalic.     Mouth/Throat:     Pharynx: No oropharyngeal exudate.  Eyes:     General: Lids are normal.     Conjunctiva/sclera: Conjunctivae normal.  Cardiovascular:     Rate and Rhythm: Normal rate and regular rhythm.     Heart sounds: Normal heart sounds, S1 normal and S2 normal.  Pulmonary:     Breath sounds: Examination of the right-lower field reveals decreased breath sounds. Examination of the left-lower field reveals decreased breath sounds. Decreased breath sounds present. No wheezing, rhonchi or rales.  Abdominal:     Palpations: Abdomen is soft.     Tenderness: There is no abdominal tenderness.  Musculoskeletal:     Right lower leg: Swelling present.     Left lower leg: Swelling present.  Skin:    General: Skin is warm.     Findings: No rash.  Neurological:     Mental Status: He is alert and oriented to person, place, and time.     Data Reviewed: Magnesium 1.6, potassium 3.4, creatinine 1.84, AST  51, platelet count 139, INR 1.4, influenza A positive, COVID test negative, CT scan of the head negative, chest x-ray negative EKG interpreted by me shows a sinus bradycardia 57 bpm incomplete right bundle branch block  Assessment and Plan: * Influenza A Start Tamiflu renally dosed.  Supportive care.  IV fluid hydration.  Physical therapy evaluation.  Check orthostatic vital signs.  AKI (acute kidney injury) ([place]) Creatinine 1.84 on presentation.  Baseline creatinine 1.26.  Gentle IV fluid hydration.  Hold ARB and hydrochlorothiazide.  Thrombocytopenia ([place]) Likely secondary to viral infection  Weakness Multiple falls at home.  Check orthostatic vital signs.  PT evaluation.  Hypomagnesemia Replace magnesium IV  Hypokalemia Replace potassium orally  Obesity (BMI 30-39.9) With current height and weight in computer BMI 34.54  History of cardiac arrhythmia Continue Multaq, Eliquis, Coreg  ANXIETY DEPRESSION Continue Lexapro  Diabetes mellitus with diabetic nephropathy ([place]) Placed on sliding scale and hold oral medications.  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      Advance Care Planning:   Code Status: Full Code   Consults: None  Severity of Illness: The appropriate patient status for this patient is INPATIENT. Inpatient status is judged to be reasonable and necessary in order to provide the required intensity of service to ensure the patient's safety. The patient's presenting symptoms, physical exam findings, and initial radiographic and laboratory data in the context of their chronic comorbidities is felt to place them at high risk for further clinical deterioration. Furthermore, it is not anticipated that the patient will be medically stable for discharge from the hospital within 2 midnights of admission.   * I certify that at the point of admission it is my clinical judgment that the patient  will require inpatient  hospital care spanning beyond 2 midnights from the point of admission due to high intensity of service, high risk for further deterioration and high frequency of surveillance required.*  Author: Loletha Grayer, MD 06/07/2022 8:53 PM  For on call review [email or web address].

## 2022-06-07 NOTE — Assessment & Plan Note (Signed)
Likely secondary to viral infection

## 2022-06-07 NOTE — ED Provider Notes (Signed)
[facility] Provider Note    Event Date/Time   First MD Initiated Contact with Patient 06/07/22 1800     (approximate)   History   Chief Complaint Fall and Altered Mental Status   HPI  Jesse EMBERTON is a 66 y.o. male with past medical history of hypertension, hyperlipidemia, diabetes, CAD, atrial fibrillation on Eliquis, SVT, and fibromyalgia who presents to the ED complaining of multiple falls.  Patient reports that for the past 2 days he has been dealing with diffuse headache, fevers, and feeling off balance.  He states his fever got as high as 103 yesterday, does report taking a dose of Tylenol earlier today.  He has not had any neck stiffness and denies any numbness or weakness.  He states that he has been feeling very unsteady on his feet, which has caused him to fall a couple of times.  He states that he may have hit his head once, but denies any loss of consciousness.  He denies any pain in his neck or neck stiffness.  He has had a dry cough with some soreness in his chest while coughing, but denies any difficulty breathing.  He also complains of some crampy discomfort in his upper abdomen, but thinks this may be muscle strain related to coughing.  He has not had any nausea or vomiting, does endorse diarrhea but denies dysuria.  He is not aware of any sick contacts, but did attend a large family gathering for Thanksgiving.  His wife also states that he has seemed more confused at times.     Physical Exam   Triage Vital Signs: ED Triage Vitals [06/07/22 1544]  Enc Vitals Group     BP 95/63     Pulse Rate (!) 53     Resp 16     Temp 98.5 F (36.9 C)     Temp src      SpO2 93 %     Weight 233 lb 14.5 oz (106.1 kg)     Height '5\' 9"'$  (1.753 m)     Head Circumference      Peak Flow      Pain Score 4     Pain Loc      Pain Edu?      Excl. in [place]?     Most recent vital signs: Vitals:   06/07/22 1544 06/07/22 1845  BP: 95/63 105/82  Pulse: (!) 53  80  Resp: 16 20  Temp: 98.5 F (36.9 C)   SpO2: 93% 94%    Constitutional: Alert and oriented to person, place, time, and situation. Eyes: Conjunctivae are normal. Head: Atraumatic. Nose: No congestion/rhinnorhea. Mouth/Throat: Mucous membranes are moist.  Neck: Supple with no meningismus.  No midline cervical spine tenderness to palpation. Cardiovascular: Normal rate, regular rhythm. Grossly normal heart sounds.  2+ radial pulses bilaterally. Respiratory: Normal respiratory effort.  No retractions. Lungs CTAB. Gastrointestinal: Soft and nontender. No distention. Musculoskeletal: No lower extremity tenderness nor edema.  Neurologic:  Normal speech and language. No gross focal neurologic deficits are appreciated.    ED Results / Procedures / Treatments   Labs (all labs ordered are listed, but only abnormal results are displayed) Labs Reviewed  RESP PANEL BY RT-PCR (FLU A&B, COVID) ARPGX2 - Abnormal; Notable for the following components:      Result Value   Influenza A by PCR POSITIVE (*)    All other components within normal limits  COMPREHENSIVE METABOLIC PANEL - Abnormal; Notable for the  following components:   Potassium 3.4 (*)    Glucose, Bld 108 (*)    BUN 24 (*)    Creatinine, Ser 1.84 (*)    AST 51 (*)    Alkaline Phosphatase 24 (*)    GFR, Estimated 40 (*)    All other components within normal limits  CBC - Abnormal; Notable for the following components:   Platelets 139 (*)    All other components within normal limits  PROTIME-INR - Abnormal; Notable for the following components:   Prothrombin Time 16.6 (*)    INR 1.4 (*)    All other components within normal limits  MAGNESIUM - Abnormal; Notable for the following components:   Magnesium 1.6 (*)    All other components within normal limits  TROPONIN I (HIGH SENSITIVITY) - Abnormal; Notable for the following components:   Troponin I (High Sensitivity) 30 (*)    All other components within normal limits  GROUP  A STREP BY PCR  CBG MONITORING, ED     EKG  ED ECG REPORT I, Blake Divine, the attending physician, personally viewed and interpreted this ECG.   Date: 06/07/2022  EKG Time: 15:46  Rate: 57  Rhythm: sinus bradycardia, PAC's noted  Axis: Normal  Intervals:none  ST&T Change: None  RADIOLOGY CT head reviewed and interpreted by me with no hemorrhage or midline shift.  PROCEDURES:  Critical Care performed: No  Procedures   MEDICATIONS ORDERED IN ED: Medications  magnesium sulfate IVPB 2 g 50 mL (has no administration in time range)  lactated ringers bolus 1,000 mL (1,000 mLs Intravenous New Bag/Given 06/07/22 1841)  potassium chloride SA (KLOR-CON M) CR tablet 20 mEq (20 mEq Oral Given 06/07/22 1841)     IMPRESSION / MDM / ASSESSMENT AND PLAN / ED COURSE  I reviewed the triage vital signs and the nursing notes.                              66 y.o. male with past medical history of hypertension, hyperlipidemia, diabetes, CAD, atrial fibrillation on Eliquis, SVT, and fibromyalgia who presents to the ED with 2 days of fevers, headache, unsteady gait, and cough.  Patient's presentation is most consistent with acute presentation with potential threat to life or bodily function.  Differential diagnosis includes, but is not limited to, stroke, TIA, electrolyte abnormality, AKI, meningitis, viral syndrome, COVID-19, influenza, UTI, pneumonia.  Patient nontoxic-appearing and in no acute distress, vital signs are unremarkable.  CT head performed from triage and is negative for acute process, no focal neurologic deficits noted on exam.  He has no midline cervical spine tenderness to raise suspicion for C-spine injury, no stiffness to suggest meningitis.  Symptoms seem likely viral in origin and testing is positive for influenza.  Chest x-ray with no evidence of pneumonia and he is not in any respiratory distress, maintaining oxygen saturations on room air. He does have an AKI with  mild hypokalemia, which we will replete.  He is also noted to be hypomagnesemic, which we will replete.  Troponin mildly elevated but suspect this is secondary to AKI, will trend.  Low suspicion for ACS or PE at this time and no findings to suggest sepsis.  We will hydrate with IV fluids and case discussed with hospitalist for admission given AKI and generalized weakness secondary to influenza.      FINAL CLINICAL IMPRESSION(S) / ED DIAGNOSES   Final diagnoses:  Influenza  AKI (acute  kidney injury) ([place])  Multiple falls     Rx / DC Orders   ED Discharge Orders     None        Note:  This document was prepared using Dragon voice recognition software and may include unintentional dictation errors.   Blake Divine, MD 06/07/22 1900

## 2022-06-07 NOTE — Assessment & Plan Note (Signed)
Start Tamiflu renally dosed.  Supportive care.  IV fluid hydration.  Physical therapy evaluation.  Check orthostatic vital signs.

## 2022-06-07 NOTE — Assessment & Plan Note (Signed)
Continue Multaq, Eliquis, Coreg

## 2022-06-07 NOTE — Assessment & Plan Note (Signed)
Patient does have 1 kidney.

## 2022-06-07 NOTE — Assessment & Plan Note (Signed)
Replaced 

## 2022-06-07 NOTE — Assessment & Plan Note (Signed)
Multiple falls at home.  Check orthostatic vital signs.  PT evaluation.

## 2022-06-07 NOTE — Assessment & Plan Note (Signed)
Placed on sliding scale and hold oral medications.

## 2022-06-07 NOTE — Assessment & Plan Note (Signed)
Replace magnesium IV

## 2022-06-07 NOTE — ED Triage Notes (Signed)
Pt here with AMS and x4 falls on yesterday. Pt states he also has a sore throat. Pt had 4 falls on yesterday, denies blood thinners but has been more altered per pt's wife. Pt states he had 103 temperature yesterday as well.

## 2022-06-07 NOTE — Assessment & Plan Note (Addendum)
Creatinine 1.84 on presentation.  Baseline creatinine 1.26.  Gentle IV fluid hydration.  Hold ARB and hydrochlorothiazide.

## 2022-06-07 NOTE — Assessment & Plan Note (Signed)
With current height and weight in computer BMI 34.54

## 2022-06-08 ENCOUNTER — Ambulatory Visit: Payer: Managed Care, Other (non HMO) | Admitting: Internal Medicine

## 2022-06-08 DIAGNOSIS — J101 Influenza due to other identified influenza virus with other respiratory manifestations: Secondary | ICD-10-CM | POA: Diagnosis not present

## 2022-06-08 LAB — BASIC METABOLIC PANEL
Anion gap: 7 (ref 5–15)
BUN: 22 mg/dL (ref 8–23)
CO2: 26 mmol/L (ref 22–32)
Calcium: 8.9 mg/dL (ref 8.9–10.3)
Chloride: 107 mmol/L (ref 98–111)
Creatinine, Ser: 1.21 mg/dL (ref 0.61–1.24)
GFR, Estimated: >60 mL/min (ref >60)
Glucose, Bld: 119 mg/dL — ABNORMAL HIGH (ref 70–99)
Potassium: 3.4 mmol/L — ABNORMAL LOW (ref 3.5–5.1)
Sodium: 140 mmol/L (ref 135–145)

## 2022-06-08 LAB — GLUCOSE, CAPILLARY
Glucose-Capillary: 110 mg/dL — ABNORMAL HIGH (ref 70–99)
Glucose-Capillary: 120 mg/dL — ABNORMAL HIGH (ref 70–99)

## 2022-06-08 LAB — CBG MONITORING, ED
Glucose-Capillary: 115 mg/dL — ABNORMAL HIGH (ref 70–99)
Glucose-Capillary: 196 mg/dL — ABNORMAL HIGH (ref 70–99)

## 2022-06-08 LAB — CBC
HCT: 39.3 % (ref 39.0–52.0)
Hemoglobin: 13.2 g/dL (ref 13.0–17.0)
MCH: 31 pg (ref 26.0–34.0)
MCHC: 33.6 g/dL (ref 30.0–36.0)
MCV: 92.3 fL (ref 80.0–100.0)
Platelets: 119 10*3/uL — ABNORMAL LOW (ref 150–400)
RBC: 4.26 MIL/uL (ref 4.22–5.81)
RDW: 12.8 % (ref 11.5–15.5)
WBC: 6.3 10*3/uL (ref 4.0–10.5)
nRBC: 0 % (ref 0.0–0.2)

## 2022-06-08 LAB — HIV ANTIBODY (ROUTINE TESTING W REFLEX): HIV Screen 4th Generation wRfx: NONREACTIVE

## 2022-06-08 LAB — HEMOGLOBIN A1C
Hgb A1c MFr Bld: 6.3 % — ABNORMAL HIGH (ref 4.8–5.6)
Mean Plasma Glucose: 134 mg/dL

## 2022-06-08 LAB — MAGNESIUM: Magnesium: 2 mg/dL (ref 1.7–2.4)

## 2022-06-08 MED ORDER — GUAIFENESIN-DM 100-10 MG/5ML PO SYRP
5.0000 mL | ORAL_SOLUTION | ORAL | Status: DC | PRN
  Filled 2022-06-08: qty 10

## 2022-06-08 MED ORDER — SALINE SPRAY 0.65 % NA SOLN
1.0000 | NASAL | Status: DC | PRN
  Filled 2022-06-08: qty 44

## 2022-06-08 MED ORDER — PHENOL 1.4 % MT LIQD
1.0000 | OROMUCOSAL | Status: DC | PRN
  Filled 2022-06-08: qty 177

## 2022-06-08 MED ORDER — POTASSIUM CHLORIDE CRYS ER 20 MEQ PO TBCR
40.0000 meq | EXTENDED_RELEASE_TABLET | Freq: Once | ORAL | Status: AC
  Administered 2022-06-08: 40 meq via ORAL
  Filled 2022-06-08: qty 2

## 2022-06-08 NOTE — ED Notes (Signed)
Pt provided with popsicle per request for his sore throat, no further needs expressed at this time, WCTM.

## 2022-06-08 NOTE — Progress Notes (Signed)
PROGRESS NOTE  Jesse Vargas  NLZ:[id number] DOB: 09-18-1955 DOA: 06/07/2022 PCP: Maryland Pink, MD   Brief Narrative: Patient is a 45 male with history of coronary artery disease, kidney cancer status post nephrectomy, chronic pain syndrome, type 2 diabetes, fibromyalgia, GERD, hypertension, hyperlipidemia, sleep apnea, SVT who presented from home with complaints of falls, confusion, weakness.  Reported falling 3 times on the floor, fever, dry cough.  CT head was negative for acute process.  Screening test positive for influenza.  Chest x-ray did not show pneumonia.  Lab work showed creatinine of 1.8, potassium 3.4.  Assessment & Plan:  Principal Problem:   Influenza A Active Problems:   AKI (acute kidney injury) ([place])   Thrombocytopenia ([place])   Weakness   Malignant neoplasm of kidney excluding renal pelvis (HCC)   Diabetes mellitus with diabetic nephropathy (HCC)   ANXIETY DEPRESSION   History of cardiac arrhythmia   Obesity (BMI 30-39.9)   Hypokalemia   Hypomagnesemia  Influenza: Presented with fever, cough, congestion.  Started tamiflu.  Continue gentle IV fluids.  Weakness/frequent falls/confusion: This is most likely secondary to viral illness .  Continue gentle IV fluids.  PT consulted.No follow up recommended  AKI: Resolved with IV fluids.  Takes hydrochlorothiazide, ARB at home was on hold  Thrombocytopenia: Stable, likely discharge with viral illness  Hypomagnesemia/hypokalemia: Being supplemented and monitored  Anxiety/depression: On Lexapro  Type II diabetes: Continue sliding scale insulin for now.  On glipizide, dulaglutide, metformin, Trulicity  History of kidney cancer: Status post nephrectomy  History of coronary artery disease: No anginal symptoms.  Cath on 2015 showed a 50 to 60% LAD lesion.  Takes baby aspirin  History of hypertension: On amlodipine, losartan, hydrochlorothiazide at home  History of hyperlipidemia: On fenofibrate  History of  paroxysmal A-fib/SVT: On Coreg, Eliquis.  Currently on normal sinus rhythm  TKW:IOXBDZH if he uses Cpap machine at home         DVT prophylaxis: apixaban (ELIQUIS) tablet 5 mg     Code Status: Full Code  Family Communication: None at bedside  Patient status:Inpatient  Patient is from :Home  Anticipated discharge GD:JMEQ  Estimated DC date:tomorrow   Consultants: None  Procedures:None  Antimicrobials:  Anti-infectives (From admission, onward)    Start     Dose/Rate Route Frequency Ordered Stop   06/08/22 1000  oseltamivir (TAMIFLU) capsule 30 mg        30 mg Oral 2 times daily 06/07/22 1917 06/13/22 0959   06/07/22 1945  oseltamivir (TAMIFLU) capsule 75 mg        75 mg Oral  Once 06/07/22 1923 06/07/22 2033       Subjective: Patient seen and examined at bedside today.  Hemodynamically stable.  On room air.  Denies any worsening shortness of breath or cough.  He states he feels better today than yesterday.  Continues to complain of weakness.  Objective: Vitals:   06/08/22 0400 06/08/22 0600 06/08/22 0700 06/08/22 0736  BP: (!) 134/96 (!) 130/38 (!) 153/68   Pulse: 62 (!) 56 (!) 33   Resp: (!) 22 (!) 24    Temp: 98.9 F (37.2 C)   98.6 F (37 C)  TempSrc:    Oral  SpO2: 91% 93% 93%   Weight:      Height:        Intake/Output Summary (Last 24 hours) at 06/08/2022 6834 Last data filed at 06/08/2022 0411 Gross per 24 hour  Intake --  Output 400 ml  Net -400 ml  Filed Weights   06/07/22 1544  Weight: 106.1 kg    Examination:  General exam: Overall comfortable, not in distress,obese HEENT: PERRL Respiratory system: Bilateral faint wheezing Cardiovascular system: S1 & S2 heard, RRR.  Gastrointestinal system: Abdomen is nondistended, soft and nontender. Central nervous system: Alert and oriented Extremities: No edema, no clubbing ,no cyanosis Skin: No rashes, no ulcers,no icterus     Data Reviewed: I have personally reviewed following labs and  imaging studies  CBC: Recent Labs  Lab 06/07/22 1546 06/08/22 0408  WBC 8.5 6.3  HGB 14.0 13.2  HCT 42.3 39.3  MCV 92.6 92.3  PLT 139* 263*   Basic Metabolic Panel: Recent Labs  Lab 06/07/22 1546 06/07/22 1555 06/08/22 0408  NA 137  --  140  K 3.4*  --  3.4*  CL 103  --  107  CO2 23  --  26  GLUCOSE 108*  --  119*  BUN 24*  --  22  CREATININE 1.84*  --  1.21  CALCIUM 9.4  --  8.9  MG  --  1.6* 2.0     Recent Results (from the past 240 hour(s))  Resp Panel by RT-PCR (Flu A&B, Covid) Anterior Nasal Swab     Status: Abnormal   Collection Time: 06/07/22  3:46 PM   Specimen: Anterior Nasal Swab  Result Value Ref Range Status   SARS Coronavirus 2 by RT PCR NEGATIVE NEGATIVE Final    Comment: (NOTE) SARS-CoV-2 target nucleic acids are NOT DETECTED.  The SARS-CoV-2 RNA is generally detectable in upper respiratory specimens during the acute phase of infection. The lowest concentration of SARS-CoV-2 viral copies this assay can detect is 138 copies/mL. A negative result does not preclude SARS-Cov-2 infection and should not be used as the sole basis for treatment or other patient management decisions. A negative result may occur with  improper specimen collection/handling, submission of specimen other than nasopharyngeal swab, presence of viral mutation(s) within the areas targeted by this assay, and inadequate number of viral copies(<138 copies/mL). A negative result must be combined with clinical observations, patient history, and epidemiological information. The expected result is Negative.  Fact Sheet for Patients:  EntrepreneurPulse.com.au  Fact Sheet for Healthcare Providers:  IncredibleEmployment.be  This test is no t yet approved or cleared by the [place] FDA and  has been authorized for detection and/or diagnosis of SARS-CoV-2 by FDA under an Emergency Use Authorization (EUA). This EUA will remain  in effect (meaning  this test can be used) for the duration of the COVID-19 declaration under Section 564(b)(1) of the Act, 21 U.S.C.section 360bbb-3(b)(1), unless the authorization is terminated  or revoked sooner.       Influenza A by PCR POSITIVE (A) NEGATIVE Final   Influenza B by PCR NEGATIVE NEGATIVE Final    Comment: (NOTE) The Xpert Xpress SARS-CoV-2/FLU/RSV plus assay is intended as an aid in the diagnosis of influenza from Nasopharyngeal swab specimens and should not be used as a sole basis for treatment. Nasal washings and aspirates are unacceptable for Xpert Xpress SARS-CoV-2/FLU/RSV testing.  Fact Sheet for Patients: EntrepreneurPulse.com.au  Fact Sheet for Healthcare Providers: IncredibleEmployment.be  This test is not yet approved or cleared by the [place] FDA and has been authorized for detection and/or diagnosis of SARS-CoV-2 by FDA under an Emergency Use Authorization (EUA). This EUA will remain in effect (meaning this test can be used) for the duration of the COVID-19 declaration under Section 564(b)(1) of the Act, 21 U.S.C. section 360bbb-3(b)(1),  unless the authorization is terminated or revoked.  Performed at [facility], [address], [place], [place] [address]      Radiology Studies: DG Chest Portable 1 View  Result Date: 06/07/2022 CLINICAL DATA:  Cough and shortness of breath. Altered mental status. EXAM: PORTABLE CHEST 1 VIEW COMPARISON:  02/18/2016 FINDINGS: The heart size and mediastinal contours are within normal limits. Both lungs are clear. The visualized skeletal structures are unremarkable. IMPRESSION: No active disease. Electronically Signed   By: Van Clines M.D.   On: 06/07/2022 18:45   CT HEAD WO CONTRAST (5MM)  Result Date: 06/07/2022 CLINICAL DATA:  Altered level of consciousness, fell 4 times yesterday EXAM: CT HEAD WITHOUT CONTRAST TECHNIQUE: Contiguous axial images were obtained from the  base of the skull through the vertex without intravenous contrast. RADIATION DOSE REDUCTION: This exam was performed according to the departmental dose-optimization program which includes automated exposure control, adjustment of the mA and/or kV according to patient size and/or use of iterative reconstruction technique. COMPARISON:  10/19/2015 FINDINGS: Brain: No acute infarct or hemorrhage. Lateral ventricles and midline structures are unremarkable. No acute extra-axial fluid collections. No mass effect. Vascular: No hyperdense vessel or unexpected calcification. Skull: Normal. Negative for fracture or focal lesion. Sinuses/Orbits: Postsurgical changes of the right maxillary sinus with bilateral ethmoidectomy is again noted. Mild mucoperiosteal thickening within the ethmoid and maxillary sinuses. Other: None. IMPRESSION: 1. No acute intracranial process. Electronically Signed   By: Randa Ngo M.D.   On: 06/07/2022 16:17    Scheduled Meds:  ALPRAZolam  2 mg Oral QHS   apixaban  5 mg Oral BID   atorvastatin  40 mg Oral Daily   carvedilol  25 mg Oral BID   dronedarone  400 mg Oral BID WC   escitalopram  20 mg Oral Daily   ezetimibe  10 mg Oral Daily   fenofibrate  160 mg Oral Daily   icosapent Ethyl  2 g Oral BID   insulin aspart  0-5 Units Subcutaneous QHS   insulin aspart  0-9 Units Subcutaneous TID WC   oseltamivir  30 mg Oral BID   pantoprazole  40 mg Oral Daily   potassium chloride  40 mEq Oral Once   Vitamin D (Ergocalciferol)  50,000 Units Oral Weekly   Continuous Infusions:  sodium chloride 75 mL/hr at 06/08/22 0736     LOS: 1 day   Shelly Coss, MD [facility] P11/30/2023, 8:22 AM

## 2022-06-08 NOTE — ED Notes (Signed)
Pt independently ambulatory to restroom without difficulty, mask provided to wear in hallway. Tooth brush and tooth paste provided to pt per request.

## 2022-06-08 NOTE — ED Notes (Signed)
Pt back to bed independently and connected to monitoring and fluids. Pt denies needs at this time. Call light in reach.

## 2022-06-08 NOTE — Evaluation (Signed)
Physical Therapy Evaluation Patient Details Name: Jesse Vargas MRN: [id number] DOB: [birth year] Today's Date: 06/08/2022  History of Present Illness  Jesse Vargas is a 74yoM who comes to [facility] after sever weakness, several falls, fever- workup shows (+) influenze. Pt lives with fiancee, works full time fixing medical devices, no prior difficulty with mobility or balance.  Clinical Impression  Pt remains incredibly weak with bed mobility but requires no physical assistance to perform. He is able to transfer and AMB with confidence and with minimal difference compared to his baseline. 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 Pt denies any injury from recent falls.  Pt is safe to return home from a PT standpoint, I don't think he needs any equipment or services upon DC.      Recommendations for follow up therapy are one component of a multi-disciplinary discharge planning process, led by the attending physician.  Recommendations may be updated based on patient status, additional functional criteria and insurance authorization.  Follow Up Recommendations No PT follow up      Assistance Recommended at Discharge None  Patient can return home with the following  Assist for transportation;Assistance with cooking/housework    Equipment Recommendations None recommended by PT  Recommendations for Other Services       Functional Status Assessment Patient has had a recent decline in their functional status and demonstrates the ability to make significant improvements in function in a reasonable and predictable amount of time.     Precautions / Restrictions Precautions Precautions: Fall Restrictions Weight Bearing Restrictions: No      Mobility  Bed Mobility Overal bed mobility: Needs Assistance Bed Mobility: Supine to Sit,  Sit to Supine     Supine to sit: Supervision Sit to supine: Supervision   General bed mobility comments: max effort required, pt remains incredibly weak in this area    Transfers Overall transfer level: Independent Equipment used: None                    Ambulation/Gait Ambulation/Gait assistance: Independent (AMB in hallway with supervision from NSG, AMB in room without difficult in exam)                Stairs            Wheelchair Mobility    Modified Rankin (Stroke Patients Only)       Balance                                 Standardized Balance Assessment Standardized Balance Assessment : Berg Balance Test Berg Balance Test Sit to Stand: Able to stand without using hands and stabilize independently Standing Unsupported: Able to stand safely 2 minutes Sitting with Back Unsupported but Feet Supported on Floor or Stool: Able to sit safely and securely 2 minutes Stand to Sit: Sits safely with minimal use of hands Transfers: Able to transfer safely, minor use of hands Standing Unsupported with Eyes Closed: Able to stand 10 seconds safely Standing Ubsupported with Feet Together: Able to place feet together independently and stand for 1 minute with supervision From Standing Position, Turn to Look Behind Over each Shoulder: Looks behind from both sides and weight shifts well Turn 360 Degrees: Able to turn 360 degrees safely but slowly Standing Unsupported, Alternately Place Feet on  Step/Stool: Able to complete 4 steps without aid or supervision         Pertinent Vitals/Pain Pain Assessment Pain Assessment:  (pain in bilat central abd muscles with coughing)    Home Living Family/patient expects to be discharged to:: Private residence Living Arrangements: Spouse/significant other;Children;Other (Comment) (Fiancee; fiancee's adult son; wife is a full time caregiver for a man who lives there) Available Help at Discharge: Family Type of  Home: House Home Access: Stairs to enter Entrance Stairs-Rails: Right Entrance Stairs-Number of Steps: 2   Home Layout: One level Home Equipment: None      Prior Function Prior Level of Function : Independent/Modified Independent                     Hand Dominance        Extremity/Trunk Assessment   Upper Extremity Assessment Upper Extremity Assessment: Overall WFL for tasks assessed;Generalized weakness    Lower Extremity Assessment Lower Extremity Assessment: Generalized weakness;Overall WFL for tasks assessed       Communication      Cognition Arousal/Alertness: Awake/alert Behavior During Therapy: WFL for tasks assessed/performed Overall Cognitive Status: Within Functional Limits for tasks assessed                                          General Comments      Exercises     Assessment/Plan    PT Assessment Patient does not need any further PT services  PT Problem List         PT Treatment Interventions      PT Goals (Current goals can be found in the Care Plan section)  Acute Rehab PT Goals PT Goal Formulation: Patient unable to participate in goal setting    Frequency       Co-evaluation               AM-PAC PT "6 Clicks" Mobility  Outcome Measure Help needed turning from your back to your side while in a flat bed without using bedrails?: None Help needed moving from lying on your back to sitting on the side of a flat bed without using bedrails?: None Help needed moving to and from a bed to a chair (including a wheelchair)?: None Help needed standing up from a chair using your arms (e.g., wheelchair or bedside chair)?: None Help needed to walk in hospital room?: None Help needed climbing 3-5 steps with a railing? : None 6 Click Score: 24    End of Session   Activity Tolerance: Patient tolerated treatment well;No increased pain Patient left: in bed;with call bell/phone within reach Nurse Communication:  Mobility status      Time: 5916-3846 PT Time Calculation (min) (ACUTE ONLY): 20 min   Charges:   PT Evaluation $PT Eval Low Complexity: 1 Low         10:37 AM, 06/08/22 Etta Grandchild, PT, DPT Physical Therapist - [facility]  [phone] ([place])    Tanaja Ganger C 06/08/2022, 10:34 AM

## 2022-06-09 DIAGNOSIS — J101 Influenza due to other identified influenza virus with other respiratory manifestations: Secondary | ICD-10-CM | POA: Diagnosis not present

## 2022-06-09 LAB — GLUCOSE, CAPILLARY
Glucose-Capillary: 121 mg/dL — ABNORMAL HIGH (ref 70–99)
Glucose-Capillary: 190 mg/dL — ABNORMAL HIGH (ref 70–99)

## 2022-06-09 LAB — BASIC METABOLIC PANEL
Anion gap: 9 (ref 5–15)
BUN: 17 mg/dL (ref 8–23)
CO2: 26 mmol/L (ref 22–32)
Calcium: 9.2 mg/dL (ref 8.9–10.3)
Chloride: 108 mmol/L (ref 98–111)
Creatinine, Ser: 1 mg/dL (ref 0.61–1.24)
GFR, Estimated: >60 mL/min (ref >60)
Glucose, Bld: 134 mg/dL — ABNORMAL HIGH (ref 70–99)
Potassium: 3.5 mmol/L (ref 3.5–5.1)
Sodium: 143 mmol/L (ref 135–145)

## 2022-06-09 LAB — GROUP A STREP BY PCR: Group A Strep by PCR: NOT DETECTED

## 2022-06-09 MED ORDER — POTASSIUM CHLORIDE CRYS ER 20 MEQ PO TBCR
40.0000 meq | EXTENDED_RELEASE_TABLET | Freq: Once | ORAL | Status: AC
  Administered 2022-06-09: 40 meq via ORAL
  Filled 2022-06-09: qty 2

## 2022-06-09 MED ORDER — OSELTAMIVIR PHOSPHATE 30 MG PO CAPS: 30.0000 mg | ORAL_CAPSULE | Freq: Two times a day (BID) | ORAL | 0 refills | Status: AC

## 2022-06-09 MED ORDER — ALBUTEROL SULFATE (2.5 MG/3ML) 0.083% IN NEBU: 2.5000 mg | INHALATION_SOLUTION | RESPIRATORY_TRACT | Status: DC | PRN

## 2022-06-09 NOTE — Discharge Summary (Signed)
Physician Discharge Summary  Jesse Vargas CVE:[id number] DOB: [birth year] DOA: 06/07/2022  PCP: Maryland Pink, MD  Admit date: 06/07/2022 Discharge date: 06/09/2022  Admitted From: Home Disposition:  Home  Discharge Condition:Stable CODE STATUS:FULL Diet recommendation: Heart Healthy   Brief/Interim Summary: Patient is a 58 male with history of coronary artery disease, kidney cancer status post nephrectomy, chronic pain syndrome, type 2 diabetes, fibromyalgia, GERD, hypertension, hyperlipidemia, sleep apnea, SVT who presented from home with complaints of falls, confusion, weakness.  Reported falling 3 times on the floor, fever, dry cough.  CT head was negative for acute process.  Screening test positive for influenza.  Chest x-ray did not show pneumonia.  Lab work showed creatinine of 1.8, potassium 3.4.  AKI resolved with IV fluids.  He remains on room air.  PT did not recommend any follow-up.  He feels much better to go home today.  Following problems were addressed during his hospitalization:   Influenza: Presented with fever, cough, congestion.  Started tamiflu. Iv fluids d/ced   Weakness/frequent falls/confusion: This is most likely secondary to viral illness .   PT consulted.No follow up recommended   AKI: Resolved with IV fluids.  Takes hydrochlorothiazide, ARB at home ,resume on dc   Thrombocytopenia: Stable   Hypomagnesemia/hypokalemia: Supplemented   Anxiety/depression: On Lexapro   Type II diabetes: Hba1c in the range of 6.  On glipizide, dulaglutide, metformin, Trulicity   History of kidney cancer: Status post nephrectomy   History of coronary artery disease: No anginal symptoms.  Cath on 2015 showed a 50 to 60% LAD lesion.  Takes baby aspirin   History of hypertension: On amlodipine, losartan, hydrochlorothiazide at home   History of hyperlipidemia: On fenofibrate   History of paroxysmal A-fib/SVT: On Coreg, Eliquis.  Currently on normal sinus rhythm    OSA:On cpap  Discharge Diagnoses:  Principal Problem:   Influenza A Active Problems:   AKI (acute kidney injury) ([place])   Thrombocytopenia ([place])   Weakness   Malignant neoplasm of kidney excluding renal pelvis (HCC)   Diabetes mellitus with diabetic nephropathy (HCC)   ANXIETY DEPRESSION   History of cardiac arrhythmia   Obesity (BMI 30-39.9)   Hypokalemia   Hypomagnesemia    Discharge Instructions  Discharge Instructions     Diet - low sodium heart healthy   Complete by: As directed    Discharge instructions   Complete by: As directed    1)Please take prescribed medication as instructed 2)Follow up with your PCP in a week 3)Monitor your blood pressure at home.   Increase activity slowly   Complete by: As directed       Allergies as of 06/09/2022   No Known Allergies      Medication List     STOP taking these medications    aspirin EC 81 MG tablet       TAKE these medications    ALPRAZolam 2 MG 24 hr tablet Commonly known as: XANAX XR Take 2 mg by mouth at bedtime.   amLODipine 10 MG tablet Commonly known as: NORVASC Take 10 mg by mouth in the morning and at bedtime.   apixaban 5 MG Tabs tablet Commonly known as: ELIQUIS Take 1 tablet (5 mg total) by mouth 2 (two) times daily.   atorvastatin 40 MG tablet Commonly known as: LIPITOR TAKE 1 TABLET BY MOUTH  DAILY AT 6 PM   carvedilol 25 MG tablet Commonly known as: COREG Take 1 tablet (25 mg total) by mouth 2 (two) times daily.  cyclobenzaprine 10 MG tablet Commonly known as: FLEXERIL Take 10 mg by mouth at bedtime.   ergocalciferol 1.25 MG (50000 UT) capsule Commonly known as: VITAMIN D2 Take 1 capsule by mouth once a week.   escitalopram 20 MG tablet Commonly known as: LEXAPRO Take 20 mg by mouth daily.   ezetimibe 10 MG tablet Commonly known as: ZETIA Take 1 tablet (10 mg total) by mouth daily. Please keep upcoming appointment for future refills thank you.   fenofibrate 160 MG  tablet Take 1 tablet by mouth  daily   fluticasone 50 MCG/ACT nasal spray Commonly known as: FLONASE Place 2 sprays into both nostrils daily.   glipiZIDE 2.5 MG 24 hr tablet Commonly known as: GLUCOTROL XL Take 2.5 mg by mouth daily. Now takes just 2.'5mg'$  daily due to low glucoses prior to arrival (updated history on 11/29)   icosapent Ethyl 1 g capsule Commonly known as: VASCEPA TAKE 2 CAPSULES(2 GRAMS) BY MOUTH TWICE DAILY   meloxicam 15 MG tablet Commonly known as: MOBIC Take 1 tablet by mouth  daily What changed: when to take this   metFORMIN 1000 MG tablet Commonly known as: GLUCOPHAGE Take 1500 mg with breakfast and 1000 mg with dinner. Do not exceed 2500 mg per day What changed:  how much to take when to take this   Multaq 400 MG tablet Generic drug: dronedarone Take 1 tablet (400 mg total) by mouth 2 (two) times daily with a meal.   nitroGLYCERIN 0.4 MG/SPRAY spray Commonly known as: NITROLINGUAL Place 1 spray under the tongue every 5 (five) minutes x 3 doses as needed for chest pain.   olmesartan-hydrochlorothiazide 40-25 MG tablet Commonly known as: BENICAR HCT Take 1 tablet by mouth daily.   omeprazole 20 MG capsule Commonly known as: PRILOSEC Take 1 capsule by mouth daily.   oseltamivir 30 MG capsule Commonly known as: TAMIFLU Take 1 capsule (30 mg total) by mouth 2 (two) times daily for 3 days.   ProAir HFA 108 (90 Base) MCG/ACT inhaler Generic drug: albuterol INHALE 2 PUFFS INTO THE  LUNGS 4 TIMES DAILY AS  NEEDED FOR WHEEZING.   tadalafil 20 MG tablet Commonly known as: CIALIS Take 20 mg by mouth daily as needed.        Follow-up Information     Maryland Pink, MD. Schedule an appointment as soon as possible for a visit in 1 week(s).   Specialty: Family Medicine Contact information: [address] [place] [address] [phone]                No Known  Allergies  Consultations: None   Procedures/Studies: DG Chest Portable 1 View  Result Date: 06/07/2022 CLINICAL DATA:  Cough and shortness of breath. Altered mental status. EXAM: PORTABLE CHEST 1 VIEW COMPARISON:  02/18/2016 FINDINGS: The heart size and mediastinal contours are within normal limits. Both lungs are clear. The visualized skeletal structures are unremarkable. IMPRESSION: No active disease. Electronically Signed   By: Van Clines M.D.   On: 06/07/2022 18:45   CT HEAD WO CONTRAST (5MM)  Result Date: 06/07/2022 CLINICAL DATA:  Altered level of consciousness, fell 4 times yesterday EXAM: CT HEAD WITHOUT CONTRAST TECHNIQUE: Contiguous axial images were obtained from the base of the skull through the vertex without intravenous contrast. RADIATION DOSE REDUCTION: This exam was performed according to the departmental dose-optimization program which includes automated exposure control, adjustment of the mA and/or kV according to patient size and/or use of iterative reconstruction technique.  COMPARISON:  10/19/2015 FINDINGS: Brain: No acute infarct or hemorrhage. Lateral ventricles and midline structures are unremarkable. No acute extra-axial fluid collections. No mass effect. Vascular: No hyperdense vessel or unexpected calcification. Skull: Normal. Negative for fracture or focal lesion. Sinuses/Orbits: Postsurgical changes of the right maxillary sinus with bilateral ethmoidectomy is again noted. Mild mucoperiosteal thickening within the ethmoid and maxillary sinuses. Other: None. IMPRESSION: 1. No acute intracranial process. Electronically Signed   By: Randa Ngo M.D.   On: 06/07/2022 16:17      Subjective: Patient seen and examined at bedside today.  Hemodynamically stable for discharge.  Discharge Exam: Vitals:   06/09/22 0535 06/09/22 0835  BP: 117/85 (!) 143/76  Pulse: 79 95  Resp:  20  Temp:  98.8 F (37.1 C)  SpO2: 94% 96%   Vitals:   06/08/22 2138 06/09/22  0533 06/09/22 0535 06/09/22 0835  BP: 132/78 (!) 165/140 117/85 (!) 143/76  Pulse: (!) 58 72 79 95  Resp: '18 16  20  '$ Temp: 98.2 F (36.8 C) 97.6 F (36.4 C)  98.8 F (37.1 C)  TempSrc: Oral     SpO2: 95% 95% 94% 96%  Weight:      Height:        General: Pt is alert, awake, not in acute distress Cardiovascular: RRR, S1/S2 +, no rubs, no gallops Respiratory: CTA bilaterally, no wheezing, no rhonchi Abdominal: Soft, NT, ND, bowel sounds + Extremities: no edema, no cyanosis    The results of significant diagnostics from this hospitalization (including imaging, microbiology, ancillary and laboratory) are listed below for reference.     Microbiology: Recent Results (from the past 240 hour(s))  Resp Panel by RT-PCR (Flu A&B, Covid) Anterior Nasal Swab     Status: Abnormal   Collection Time: 06/07/22  3:46 PM   Specimen: Anterior Nasal Swab  Result Value Ref Range Status   SARS Coronavirus 2 by RT PCR NEGATIVE NEGATIVE Final    Comment: (NOTE) SARS-CoV-2 target nucleic acids are NOT DETECTED.  The SARS-CoV-2 RNA is generally detectable in upper respiratory specimens during the acute phase of infection. The lowest concentration of SARS-CoV-2 viral copies this assay can detect is 138 copies/mL. A negative result does not preclude SARS-Cov-2 infection and should not be used as the sole basis for treatment or other patient management decisions. A negative result may occur with  improper specimen collection/handling, submission of specimen other than nasopharyngeal swab, presence of viral mutation(s) within the areas targeted by this assay, and inadequate number of viral copies(<138 copies/mL). A negative result must be combined with clinical observations, patient history, and epidemiological information. The expected result is Negative.  Fact Sheet for Patients:  EntrepreneurPulse.com.au  Fact Sheet for Healthcare Providers:   IncredibleEmployment.be  This test is no t yet approved or cleared by the [place] FDA and  has been authorized for detection and/or diagnosis of SARS-CoV-2 by FDA under an Emergency Use Authorization (EUA). This EUA will remain  in effect (meaning this test can be used) for the duration of the COVID-19 declaration under Section 564(b)(1) of the Act, 21 U.S.C.section 360bbb-3(b)(1), unless the authorization is terminated  or revoked sooner.       Influenza A by PCR POSITIVE (A) NEGATIVE Final   Influenza B by PCR NEGATIVE NEGATIVE Final    Comment: (NOTE) The Xpert Xpress SARS-CoV-2/FLU/RSV plus assay is intended as an aid in the diagnosis of influenza from Nasopharyngeal swab specimens and should not be used as a sole basis for treatment.  Nasal washings and aspirates are unacceptable for Xpert Xpress SARS-CoV-2/FLU/RSV testing.  Fact Sheet for Patients: EntrepreneurPulse.com.au  Fact Sheet for Healthcare Providers: IncredibleEmployment.be  This test is not yet approved or cleared by the [place] FDA and has been authorized for detection and/or diagnosis of SARS-CoV-2 by FDA under an Emergency Use Authorization (EUA). This EUA will remain in effect (meaning this test can be used) for the duration of the COVID-19 declaration under Section 564(b)(1) of the Act, 21 U.S.C. section 360bbb-3(b)(1), unless the authorization is terminated or revoked.  Performed at [facility], [place], [place] [address]   Group A Strep by PCR [facility] Only)     Status: None   Collection Time: 06/09/22  5:20 AM   Specimen: Anterior Nasal Swab; Sterile Swab  Result Value Ref Range Status   Group A Strep by PCR NOT DETECTED NOT DETECTED Final    Comment: Performed at [facility], [address] [place], [place] [address]     Labs: BNP (last 3 results) No results for input(s): "BNP" in the last  8760 hours. Basic Metabolic Panel: Recent Labs  Lab 06/07/22 1546 06/07/22 1555 06/08/22 0408 06/09/22 0528  NA 137  --  140 143  K 3.4*  --  3.4* 3.5  CL 103  --  107 108  CO2 23  --  26 26  GLUCOSE 108*  --  119* 134*  BUN 24*  --  22 17  CREATININE 1.84*  --  1.21 1.00  CALCIUM 9.4  --  8.9 9.2  MG  --  1.6* 2.0  --    Liver Function Tests: Recent Labs  Lab 06/07/22 1546  AST 51*  ALT 41  ALKPHOS 24*  BILITOT 0.6  PROT 7.0  ALBUMIN 3.9   No results for input(s): "LIPASE", "AMYLASE" in the last 168 hours. No results for input(s): "AMMONIA" in the last 168 hours. CBC: Recent Labs  Lab 06/07/22 1546 06/08/22 0408  WBC 8.5 6.3  HGB 14.0 13.2  HCT 42.3 39.3  MCV 92.6 92.3  PLT 139* 119*   Cardiac Enzymes: No results for input(s): "CKTOTAL", "CKMB", "CKMBINDEX", "TROPONINI" in the last 168 hours. BNP: Invalid input(s): "POCBNP" CBG: Recent Labs  Lab 06/08/22 0735 06/08/22 1204 06/08/22 1611 06/08/22 2135 06/09/22 0832  GLUCAP 115* 196* 110* 120* 121*   D-Dimer No results for input(s): "DDIMER" in the last 72 hours. Hgb A1c Recent Labs    06/07/22 1555  HGBA1C 6.3*   Lipid Profile No results for input(s): "CHOL", "HDL", "LDLCALC", "TRIG", "CHOLHDL", "LDLDIRECT" in the last 72 hours. Thyroid function studies No results for input(s): "TSH", "T4TOTAL", "T3FREE", "THYROIDAB" in the last 72 hours.  Invalid input(s): "FREET3" Anemia work up No results for input(s): "VITAMINB12", "FOLATE", "FERRITIN", "TIBC", "IRON", "RETICCTPCT" in the last 72 hours. Urinalysis    Component Value Date/Time   COLORURINE yellow 03/28/2010 1558   APPEARANCEUR Clear 03/28/2010 1558   LABSPEC 1.010 03/28/2010 1558   PHURINE 5.0 03/28/2010 1558   HGBUR negative 03/28/2010 1558   BILIRUBINUR neg 10/14/2014 1325   PROTEINUR 100 10/14/2014 1325   UROBILINOGEN 0.2 10/14/2014 1325   UROBILINOGEN 0.2 03/28/2010 1558   NITRITE neg 10/14/2014 1325   NITRITE negative  03/28/2010 1558   LEUKOCYTESUR Negative 10/14/2014 1325   Sepsis Labs Recent Labs  Lab 06/07/22 1546 06/08/22 0408  WBC 8.5 6.3   Microbiology Recent Results (from the past 240 hour(s))  Resp Panel by RT-PCR (Flu A&B, Covid) Anterior  Nasal Swab     Status: Abnormal   Collection Time: 06/07/22  3:46 PM   Specimen: Anterior Nasal Swab  Result Value Ref Range Status   SARS Coronavirus 2 by RT PCR NEGATIVE NEGATIVE Final    Comment: (NOTE) SARS-CoV-2 target nucleic acids are NOT DETECTED.  The SARS-CoV-2 RNA is generally detectable in upper respiratory specimens during the acute phase of infection. The lowest concentration of SARS-CoV-2 viral copies this assay can detect is 138 copies/mL. A negative result does not preclude SARS-Cov-2 infection and should not be used as the sole basis for treatment or other patient management decisions. A negative result may occur with  improper specimen collection/handling, submission of specimen other than nasopharyngeal swab, presence of viral mutation(s) within the areas targeted by this assay, and inadequate number of viral copies(<138 copies/mL). A negative result must be combined with clinical observations, patient history, and epidemiological information. The expected result is Negative.  Fact Sheet for Patients:  EntrepreneurPulse.com.au  Fact Sheet for Healthcare Providers:  IncredibleEmployment.be  This test is no t yet approved or cleared by the [place] FDA and  has been authorized for detection and/or diagnosis of SARS-CoV-2 by FDA under an Emergency Use Authorization (EUA). This EUA will remain  in effect (meaning this test can be used) for the duration of the COVID-19 declaration under Section 564(b)(1) of the Act, 21 U.S.C.section 360bbb-3(b)(1), unless the authorization is terminated  or revoked sooner.       Influenza A by PCR POSITIVE (A) NEGATIVE Final   Influenza B by PCR  NEGATIVE NEGATIVE Final    Comment: (NOTE) The Xpert Xpress SARS-CoV-2/FLU/RSV plus assay is intended as an aid in the diagnosis of influenza from Nasopharyngeal swab specimens and should not be used as a sole basis for treatment. Nasal washings and aspirates are unacceptable for Xpert Xpress SARS-CoV-2/FLU/RSV testing.  Fact Sheet for Patients: EntrepreneurPulse.com.au  Fact Sheet for Healthcare Providers: IncredibleEmployment.be  This test is not yet approved or cleared by the [place] FDA and has been authorized for detection and/or diagnosis of SARS-CoV-2 by FDA under an Emergency Use Authorization (EUA). This EUA will remain in effect (meaning this test can be used) for the duration of the COVID-19 declaration under Section 564(b)(1) of the Act, 21 U.S.C. section 360bbb-3(b)(1), unless the authorization is terminated or revoked.  Performed at [facility], [place], [place] [address]   Group A Strep by PCR [facility] Only)     Status: None   Collection Time: 06/09/22  5:20 AM   Specimen: Anterior Nasal Swab; Sterile Swab  Result Value Ref Range Status   Group A Strep by PCR NOT DETECTED NOT DETECTED Final    Comment: Performed at [facility], [address], [place], [place] [address]    Please note: You were cared for by a hospitalist during your hospital stay. Once you are discharged, your primary care physician will handle any further medical issues. Please note that NO REFILLS for any discharge medications will be authorized once you are discharged, as it is imperative that you return to your primary care physician (or establish a relationship with a primary care physician if you do not have one) for your post hospital discharge needs so that they can reassess your need for medications and monitor your lab values.    Time coordinating discharge: 40 minutes  SIGNED:   Shelly Coss, MD  [facility] 06/09/2022, 10:55 AM Pager [phone]  If 7PM-7AM, please contact night-coverage  [email or web address] Password [email or web address]

## 2022-06-09 NOTE — Progress Notes (Signed)
       CROSS COVER NOTE  NAME: Jesse Vargas MRN: [id number] DOB : [birth year]    Time  of Service   0135 am  HPI/Events of Note   Nurse reports patient with exp wheeze and sleep apnea history with home CPAP use that is not ordered   Assessment and  Interventions   Assessment: History labs and HPI reviewed Echo shows dilated cardiomyopathy with EF 60-65 % Renal function at baseline on las AM labs Plan: Stop IV fluids CPAP at HS Albuterol as needed for wheezing      Kathlene Cote NP [facility]

## 2022-06-09 NOTE — Plan of Care (Signed)

## 2022-06-21 ENCOUNTER — Other Ambulatory Visit: Payer: Self-pay | Admitting: Internal Medicine

## 2022-06-24 NOTE — Progress Notes (Unsigned)
Cardiology Office Note   Date:  06/27/2022   ID:  Jesse Vargas, DOB 1956/02/01, MRN [id number]  PCP:  Maryland Pink, MD  Cardiologist:   Dorris Carnes, MD   Pt presents for r/u of CAD and Afib   History of Present Illness: Jesse Vargas is a 66 y.o. male with a history of mild CAD by cath in 2007. Also a history of HTN, SVT(s/p ablation for AVNRT in 2015)  M , HL, sleep apnea. Myoview in 2012 done after seen. This was normal.  In 2015 he had CP that lead to [facility].  This showed 50 to 60% LAD lesion  Pt also with hx of SVT and is s/p ablation in 2015 I saw him in JUne 2023   BP wa high  I recomm increasing amlodipine to 5 mg BID   Also sched patient for echo   LVEF wa normal  Gr II diastolic dysfunction     He was seen in September 2023 for palpitations, fatigue   The patientnoticed while working in yard.    HR 180s then down to 120s .  Zio patch ordered and showed atrial fibrillation 3% of time    Carvedilol dose increased    with improvement of symptoms      I saw the pt in OCt 2023    Started on Multaq.  Carvedilol dose was decreased     Since then he says had 1 or 2 episodes of feeling heart race    Last episdoe was  yesterday  He says he had a  diet pepsi and a hot dog   Felt fluttering (HR up to 140 bpm)   Sat and rested   Did deep breathing   Came down in about I 15 min       Current Outpatient Medications  Medication Sig Dispense Refill   ALPRAZolam (XANAX XR) 2 MG 24 hr tablet Take 2 mg by mouth at bedtime.     amLODipine (NORVASC) 10 MG tablet Take 10 mg by mouth in the morning and at bedtime.     apixaban (ELIQUIS) 5 MG TABS tablet Take 1 tablet (5 mg total) by mouth 2 (two) times daily. 180 tablet 3   atorvastatin (LIPITOR) 40 MG tablet TAKE 1 TABLET BY MOUTH  DAILY AT 6 PM 30 tablet 0   carvedilol (COREG) 25 MG tablet Take 1 tablet (25 mg total) by mouth 2 (two) times daily. 180 tablet 3   dronedarone (MULTAQ) 400 MG tablet Take 1 tablet (400 mg total) by mouth 2  (two) times daily with a meal. 180 tablet 3   ergocalciferol (VITAMIN D2) 1.25 MG (50000 UT) capsule Take 1 capsule by mouth once a week.     escitalopram (LEXAPRO) 20 MG tablet Take 20 mg by mouth daily.     ezetimibe (ZETIA) 10 MG tablet TAKE 1 TABLET BY MOUTH DAILY 90 tablet 3   fenofibrate 160 MG tablet Take 1 tablet by mouth  daily 30 tablet 0   fluticasone (FLONASE) 50 MCG/ACT nasal spray Place 2 sprays into both nostrils daily. 48 g 1   glipiZIDE (GLUCOTROL XL) 2.5 MG 24 hr tablet Take 2.5 mg by mouth daily. Now takes just 2.'5mg'$  daily due to low glucoses prior to arrival (updated history on 11/29)     icosapent Ethyl (VASCEPA) 1 g capsule TAKE 2 CAPSULES(2 GRAMS) BY MOUTH TWICE DAILY 360 capsule 3   meloxicam (MOBIC) 15 MG tablet Take 1 tablet by mouth  daily 90 tablet 3   metFORMIN (GLUCOPHAGE) 1000 MG tablet Take 1500 mg with breakfast and 1000 mg with dinner. Do not exceed 2500 mg per day 225 tablet 2   olmesartan-hydrochlorothiazide (BENICAR HCT) 40-25 MG tablet Take 1 tablet by mouth daily.     omeprazole (PRILOSEC) 20 MG capsule Take 1 capsule by mouth daily.     PROAIR HFA 108 (90 BASE) MCG/ACT inhaler INHALE 2 PUFFS INTO THE  LUNGS 4 TIMES DAILY AS  NEEDED FOR WHEEZING. 17 g 3   tadalafil (CIALIS) 20 MG tablet Take 20 mg by mouth daily as needed.     No current facility-administered medications for this visit.    Allergies:   Patient has no known allergies.   Past Medical History:  Diagnosis Date   CAD (coronary artery disease)    a. LHC (12/19/13):  mid LAD 50-60%, EF 55%, patent CFX and RCA - med Rx.   Cancer of kidney -status post nephrectomy    Kidney cancer 1999   Chronic neck pain    Chronic pain syndrome    Diabetes mellitus    Family history of long QT syndrome    Fibromyalgia    GERD (gastroesophageal reflux disease)    Hx of echocardiogram    a.  Echo (12/2013):  mod LVH, EF 55-60%, no RWMA, mild LAE   Hyperlipidemia    Hypertension    Insomnia    Sleep  apnea    SVT (supraventricular tachycardia)    Documentation pending but occurring during the stress test with Dr. 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 by Dr Lovena Le    Past Surgical History:  Procedure Laterality Date   ABLATION  08/27/13   AVNRT ablatin by Dr Lovena Le   CHOLECYSTECTOMY     2000   elbow surgery     left   INNER EAR SURGERY     R ear   LEFT HEART CATHETERIZATION WITH CORONARY ANGIOGRAM N/A 12/19/2013   Procedure: LEFT HEART CATHETERIZATION WITH CORONARY ANGIOGRAM;  Surgeon: Sinclair Grooms, MD;  [facility] LAB;  Service: Cardiovascular;  Laterality: N/A;   NASAL SINUS SURGERY     2005   NEPHRECTOMY     L---1999 Cancer   SUPRAVENTRICULAR TACHYCARDIA ABLATION N/A 08/27/2013   Procedure: SUPRAVENTRICULAR TACHYCARDIA ABLATION;  Surgeon: Evans Lance, MD;  Location: [facility] CATH LAB;  Service: Cardiovascular;  Laterality: N/A;   TONSILLECTOMY     08/1988   UVULOPALATOPHARYNGOPLASTY     08/1988     Social History:  The patient  reports that he quit smoking about 24 years ago. His smoking use included cigarettes. He has a 50.00 pack-year smoking history. He has never used smokeless tobacco. He reports current alcohol use. He reports that he does not use drugs.   Family History:  The patient's family history includes Colon polyps in his father; Diabetes in his father and mother; Hyperlipidemia in his father; Hypertension in his father and mother.    ROS:  Please see the history of present illness. All other systems are reviewed and  Negative to the above problem except as noted.    PHYSICAL EXAM: VS:  BP 120/70 (BP Location: Left Arm, Patient Position: Sitting, Cuff Size: Normal)   Pulse (!) 54   Ht '5\' 9"'$  (1.753 m)   Wt 232 lb (105.2 kg)   SpO2 96%   BMI 34.26 kg/m   GEN  Obese 66 yo  in no acute distress HEENT: normal Neck: no JVD, no  carotid bruit Cardiac: RRR; II/VI systolic murmur    No  LE  edema  Respiratory:  clear to auscultation bilaterally GI: soft,  nontender, nondistended, + BS  No hepatomegaly  MS: no deformity Moving all extremities   Skin: warm and dry, no rash Neuro:  Strength and sensation are intact Psych: euthymic mood, full affect   EKG:  EKG is not ordered    Zio patch Sept 2023 Predominant rhythm:  SInus rhythm     Rates 46 to 88 bpm  Average HR 61 bpm Atrial fib/flutter occurred 3% time    Max HR 152 bpm   Average HR 61 bpm Rare PVC Triggered events mostly occurred with atrial fib with RVR   One episode occurred with SR with PVC   Patient had a min HR of 46 bpm, max HR of 152 bpm, and avg HR of 61 bpm. Predominant underlying rhythm was Sinus Rhythm. Atrial Fibrillation/Flutter occurred (3% burden), ranging from 55-152 bpm (avg of 87 bpm), the longest lasting 8 hours 13 mins with  an avg rate of 85 bpm. Atrial Fibrillation/Flutter was detected within +/- 45 seconds of symptomatic patient event(s). Isolated SVEs were rare (<1.0%), SVE Couplets were rare (<1.0%), and SVE Triplets were rare (<1.0%). Isolated VEs were rare (<1.0%,  378), VE Couplets were rare (<1.0%, 12), and VE Triplets were rare (<1.0%, 3).   Echo   JUly 2023   1. Left ventricular ejection fraction, by estimation, is 60 to 65%. The  left ventricle has normal function. The left ventricle has no regional  wall motion abnormalities. There is mild concentric left ventricular  hypertrophy. Left ventricular diastolic  parameters are consistent with Grade II diastolic dysfunction  (pseudonormalization). The average left ventricular global longitudinal  strain is -26.7 %. The global longitudinal strain is normal.   2. Right ventricular systolic function is normal. The right ventricular  size is mildly enlarged. There is normal pulmonary artery systolic  pressure.   3. Left atrial size was moderately dilated.   4. Right atrial size was mild to moderately dilated.   5. The mitral valve is normal in structure. Trivial mitral valve  regurgitation. No evidence of  mitral stenosis.   6. The aortic valve is tricuspid. There is mild calcification of the  aortic valve. Aortic valve regurgitation is not visualized. Aortic valve  sclerosis is present, with no evidence of aortic valve stenosis.   7. The inferior vena cava is normal in size with greater than 50%  respiratory variability, suggesting right atrial pressure of 3 mmHg.   Comparison(s): No significant change from prior study. Lipid Panel    Component Value Date/Time   CHOL 118 12/28/2021 1649   TRIG 570 (HH) 12/28/2021 1649   HDL 21 (L) 12/28/2021 1649   CHOLHDL 5.6 (H) 12/28/2021 1649   CHOLHDL 6.4 12/19/2013 0400   VLDL UNABLE TO CALCULATE IF TRIGLYCERIDE OVER 400 mg/dL 12/19/2013 0400   LDLCALC 21 12/28/2021 1649   LDLDIRECT 71.5 10/19/2011 1057      Wt Readings from Last 3 Encounters:  06/27/22 232 lb (105.2 kg)  06/07/22 233 lb 14.5 oz (106.1 kg)  05/25/22 234 lb (106.1 kg)      ASSESSMENT AND PLAN:  1. Atrial fibrillation   Continue on Multaq and Eliquis      2  CAD  Nonobstructive by cath in 2007     Pt remains asymptomatic     3  HTN  BP is controlled     4.  HL   LDL 27  HDL 21  Trig 570   CUt back on all carbs   A1C 6.3   Trig have been better in past   5  OSA   Will set up for sleep study    6  DM ]A1C 6.3       Signed, Dorris Carnes, MD  06/27/2022 9:01 PM    [facility] [place], [place], [place]  [address] Phone: [phone]; Fax: [phone]

## 2022-06-27 ENCOUNTER — Encounter: Payer: Self-pay | Admitting: Internal Medicine

## 2022-06-27 ENCOUNTER — Ambulatory Visit: Payer: Managed Care, Other (non HMO) | Attending: Internal Medicine | Admitting: Internal Medicine

## 2022-06-27 VITALS — BP 120/70 | HR 54 | Ht 69.0 in | Wt 232.0 lb

## 2022-06-27 DIAGNOSIS — I480 Paroxysmal atrial fibrillation: Secondary | ICD-10-CM | POA: Diagnosis not present

## 2022-06-27 NOTE — Patient Instructions (Signed)
Medication Instructions:   *If you need a refill on your cardiac medications before your next appointment, please call your pharmacy*   Lab Work:  If you have labs (blood work) drawn today and your tests are completely normal, you will receive your results only by: Stella (if you have MyChart) OR A paper copy in the mail If you have any lab test that is abnormal or we need to change your treatment, we will call you to review the results.   Testing/Procedures:    Follow-Up: At [facility], you and your health needs are our priority.  As part of our continuing mission to provide you with exceptional heart care, we have created designated Provider Care Teams.  These Care Teams include your primary Cardiologist (physician) and Advanced Practice Providers (APPs -  Physician Assistants and Nurse Practitioners) who all work together to provide you with the care you need, when you need it.  We recommend signing up for the patient portal called "MyChart".  Sign up information is provided on this After Visit Summary.  MyChart is used to connect with patients for Virtual Visits (Telemedicine).  Patients are able to view lab/test results, encounter notes, upcoming appointments, etc.  Non-urgent messages can be sent to your provider as well.   To learn more about what you can do with MyChart, go to [email or web address].    Your next appointment:   7 month(s)  The format for your next appointment:   In Person  Provider:   Dorris Carnes, MD     Other Instructions   Important Information About Sugar

## 2022-08-12 ENCOUNTER — Encounter: Payer: Self-pay | Admitting: Internal Medicine

## 2022-08-31 ENCOUNTER — Telehealth: Payer: Self-pay | Admitting: *Deleted

## 2022-08-31 NOTE — Telephone Encounter (Signed)
   Pre-operative Risk Assessment    Patient Name: Jesse Vargas  DOB: 1955/10/06 MRN: [id number]      Request for Surgical Clearance    Procedure:   LUMBAR EPIDURAL INJECTION  Date of Surgery:  Clearance TBD                                 Surgeon:   Surgeon's Group or Practice Name:  [facility] Phone number:  [phone] Fax number:  [phone]  ATTN:  CRYSTAL CROCKER   Type of Clearance Requested:   - Pharmacy:  Hold Apixaban (Eliquis) X'S 3 DAYS PRIOR AND RESUME 24 HOURS AFTER   Type of Anesthesia:   JUST STATES WITHOUT SEDATION   Additional requests/questions:    Astrid Divine   08/31/2022, 7:04 AM

## 2022-09-01 NOTE — Telephone Encounter (Signed)
   Patient Name: Jesse Vargas  DOB: 1955-10-19 MRN: [id number]  Primary Cardiologist: Dorris Carnes, MD  Clinical pharmacists have reviewed the patient's past medical history, labs, and current medications as part of preoperative protocol coverage. The following recommendations have been made:  Patient with diagnosis of afib on Eliquis for anticoagulation.     Procedure: LUMBAR EPIDURAL INJECTION  Date of procedure: TBD     CHA2DS2-VASc Score = 3   This indicates a 3.2% annual risk of stroke. The patient's score is based upon: CHF History: 0 HTN History: 1 Diabetes History: 1 Stroke History: 0 Vascular Disease History: 1 Age Score: 0 Gender Score: 0     CrCl 89 ml/min   Per office protocol, patient can hold Eliquis for 3 days prior to procedure.  Please resume Eliquis as soon as possible postprocedure, at the discretion of the surgeon.   I will route this recommendation to the requesting party via Epic fax function and remove from pre-op pool.  Please call with questions.  Lenna Sciara, NP 09/01/2022, 1:50 PM

## 2022-09-01 NOTE — Telephone Encounter (Signed)
Patient with diagnosis of afib on Eliquis for anticoagulation.    Procedure: LUMBAR EPIDURAL INJECTION  Date of procedure: TBD   CHA2DS2-VASc Score = 3   This indicates a 3.2% annual risk of stroke. The patient's score is based upon: CHF History: 0 HTN History: 1 Diabetes History: 1 Stroke History: 0 Vascular Disease History: 1 Age Score: 0 Gender Score: 0      CrCl 89 ml/min  Per office protocol, patient can hold Eliquis for 3 days prior to procedure.    **This guidance is not considered finalized until pre-operative APP has relayed final recommendations.**

## 2022-09-26 ENCOUNTER — Ambulatory Visit: Payer: 59 | Admitting: Urology

## 2022-10-18 ENCOUNTER — Ambulatory Visit: Payer: Managed Care, Other (non HMO) | Admitting: Urology

## 2022-10-18 VITALS — BP 105/65 | HR 60 | Ht 69.0 in | Wt 232.5 lb

## 2022-10-18 DIAGNOSIS — F5232 Male orgasmic disorder: Secondary | ICD-10-CM

## 2022-10-18 NOTE — Progress Notes (Signed)
Jesse Vargas,acting as a scribe for Jesse Scotland, MD.,have documented all relevant documentation on the behalf of Jesse Scotland, MD,as directed by  Jesse Scotland, MD while in the presence of Jesse Scotland, MD.  10/18/2022 4:32 PM   Jesse Vargas 1956/04/09 [id number]  Referring provider: [name], MD [address] [facility] Jesse [place]arg[place],  [place] [address]  Chief Complaint  Patient presents with   Establish Care   Erectile Dysfunction    Issues during intercourse    HPI: 67 year old male referred for further evaluation of erectile dysfunction. He has multiple medical comorbidities including personal history of diabetes and coronary artery disease. He was recently prescribed tadalafil Cialis 20 mg.   Today, he reports difficulty achieving and maintaining an erection as well as an inability to climax during intercourse. Despite using Cialis (Tadalafil) 20 mg, he reports no significant improvement in his erectile function or ability to climax. Felder describes a sensation of ejaculate fluid getting "stuck" and has attempted manual stimulation without success. He denies any curvature of the penis that might suggest Peyronie's disease. He states that his libido is intact.   [name] has been on Lexapro for depression and anxiety for approximately five to six years, which temporally correlates with the onset of his sexual dysfunction.   He also reports a history of poorly controlled diabetes, with a recent improvement in glycemic control. His most recent A1C was 6.3 on 06/07/2022. He reports some degree of peripheral neuropathy, without significant pain or discomfort in the feet.   Jesse Vargas lives with his partner and her family, including a bedridden older individual and a 78 year old son. He expresses concern about the impact of his sexual dysfunction on his relationship and emotional well-being.    SHIM     Row Name 10/18/22 1452         SHIM: Over the last 6  months:   How do you rate your confidence that you could get and keep an erection? Moderate     When you had erections with sexual stimulation, how often were your erections hard enough for penetration (entering your partner)? Most Times (much more than half the time)     During sexual intercourse, how often were you able to maintain your erection after you had penetrated (entered) your partner? Most Times (much more than half the time)     During sexual intercourse, how difficult was it to maintain your erection to completion of intercourse? Slightly Difficult     When you attempted sexual intercourse, how often was it satisfactory for you? Sometimes (about half the time)       SHIM Total Score   SHIM 18              Score: 1-7 Severe ED 8-11 Moderate ED 12-16 Mild-Moderate ED 17-21 Mild ED 22-25 No ED     PMH: Past Medical History:  Diagnosis Date   CAD (coronary artery disease)    a. LHC (12/19/13):  mid LAD 50-60%, EF 55%, patent CFX and RCA - med Rx.   Cancer of kidney -status post nephrectomy    Kidney cancer 1999   Chronic neck pain    Chronic pain syndrome    Diabetes mellitus    Family history of long QT syndrome    Fibromyalgia    GERD (gastroesophageal reflux disease)    Hx of echocardiogram    a.  Echo (12/2013):  mod LVH, EF 55-60%, no RWMA, mild LAE  Hyperlipidemia    Hypertension    Insomnia    Sleep apnea    SVT (supraventricular tachycardia)    Documentation pending but occurring during the stress test with Dr. Juliann Pares; s/p AVNRT ablation 08/2013 by Dr Ladona Ridgel    Surgical History: Past Surgical History:  Procedure Laterality Date   ABLATION  08/27/13   AVNRT ablatin by Dr Ladona Ridgel   CHOLECYSTECTOMY     2000   elbow surgery     left   INNER EAR SURGERY     R ear   LEFT HEART CATHETERIZATION WITH CORONARY ANGIOGRAM N/A 12/19/2013   Procedure: LEFT HEART CATHETERIZATION WITH CORONARY ANGIOGRAM;  Surgeon: [name], MD;  Location: [facility] CATH  LAB;  Service: Cardiovascular;  Laterality: N/A;   NASAL SINUS SURGERY     2005   NEPHRECTOMY     L---1999 Cancer   SUPRAVENTRICULAR TACHYCARDIA ABLATION N/A 08/27/2013   Procedure: SUPRAVENTRICULAR TACHYCARDIA ABLATION;  Surgeon: [name], MD;  Location: [facility] CATH LAB;  Service: Cardiovascular;  Laterality: N/A;   TONSILLECTOMY     08/1988   UVULOPALATOPHARYNGOPLASTY     08/1988    Home Medications:  Allergies as of 10/18/2022   No Known Allergies      Medication List        Accurate as of October 18, 2022  4:32 PM. If you have any questions, ask your nurse or doctor.          ALPRAZolam 2 MG 24 hr tablet Commonly known as: XANAX XR Take 2 mg by mouth at bedtime.   amLODipine 10 MG tablet Commonly known as: NORVASC Take 10 mg by mouth in the morning and at bedtime.   apixaban 5 MG Tabs tablet Commonly known as: ELIQUIS Take 1 tablet (5 mg total) by mouth 2 (two) times daily.   atorvastatin 40 MG tablet Commonly known as: LIPITOR TAKE 1 TABLET BY MOUTH  DAILY AT 6 PM   carvedilol 25 MG tablet Commonly known as: COREG Take 1 tablet (25 mg total) by mouth 2 (two) times daily.   cyclobenzaprine 5 MG tablet Commonly known as: FLEXERIL Take 5 mg by mouth 3 (three) times daily as needed.   ergocalciferol 1.25 MG (50000 UT) capsule Commonly known as: VITAMIN D2 Take 1 capsule by mouth once a week.   escitalopram 20 MG tablet Commonly known as: LEXAPRO Take 20 mg by mouth daily.   ezetimibe 10 MG tablet Commonly known as: ZETIA TAKE 1 TABLET BY MOUTH DAILY   fenofibrate 160 MG tablet Take 1 tablet by mouth  daily   fluticasone 50 MCG/ACT nasal spray Commonly known as: FLONASE Place 2 sprays into both nostrils daily.   gabapentin 300 MG capsule Commonly known as: NEURONTIN Take 1 capsule by mouth 2 (two) times daily as needed.   glipiZIDE 2.5 MG 24 hr tablet Commonly known as: GLUCOTROL XL Take 2.5 mg by mouth daily. Now takes just 2.5mg  daily due to  low glucoses prior to arrival (updated history on 11/29)   icosapent Ethyl 1 g capsule Commonly known as: VASCEPA TAKE 2 CAPSULES(2 GRAMS) BY MOUTH TWICE DAILY What changed:  how much to take how to take this when to take this additional instructions   meloxicam 15 MG tablet Commonly known as: MOBIC Take 1 tablet by mouth  daily   metFORMIN 1000 MG tablet Commonly known as: GLUCOPHAGE 1 tablet in the morning and 1 1/2 tablets in the evening What changed: Another medication with the  same name was removed. Continue taking this medication, and follow the directions you see here.   Multaq 400 MG tablet Generic drug: dronedarone Take 1 tablet (400 mg total) by mouth 2 (two) times daily with a meal.   olmesartan-hydrochlorothiazide 40-25 MG tablet Commonly known as: BENICAR HCT Take 1 tablet by mouth daily.   omeprazole 20 MG capsule Commonly known as: PRILOSEC Take 1 capsule by mouth daily.   ProAir HFA 108 (90 Base) MCG/ACT inhaler Generic drug: albuterol INHALE 2 PUFFS INTO THE  LUNGS 4 TIMES DAILY AS  NEEDED FOR WHEEZING.   tadalafil 20 MG tablet Commonly known as: CIALIS Take 20 mg by mouth daily as needed.        Family History: Family History  Problem Relation Age of Onset   Diabetes Father    Hypertension Father    Hyperlipidemia Father    Colon polyps Father    Hypertension Mother    Diabetes Mother    Colon cancer Neg Hx    Stomach cancer Neg Hx     Social History:  reports that he quit smoking about 24 years ago. His smoking use included cigarettes. He has a 50.00 pack-year smoking history. He has never used smokeless tobacco. He reports current alcohol use. He reports that he does not use drugs.   Physical Exam: BP 105/65   Pulse 60   Ht 5\' 9"  (1.753 m)   Wt 232 lb 8 oz (105.5 kg)   BMI 34.33 kg/m   Constitutional:  Alert and oriented, No acute distress. HEENT: [place] AT, moist mucus membranes.  Trachea midline, no masses. Neurologic: Grossly  intact, no focal deficits, moving all 4 extremities. Psychiatric: Normal mood and affect.   Assessment & Plan:    1. Delayed ejaculation - Likely multifactorial, with potential contributions from SSRI use (Lexapro) and peripheral neuropathy. The onset and timing of sexual dysfunction correlate with the initiation of Lexapro. Plan to consult with Dr. Burnett ShengHedrick regarding the possibility of titrating or changing medication.  - Consider introducing vibration for increased stimulation and explore sex therapy to address psychosocial issues.  - Encourage continued use of Cialis to optimize erection quality.   Return if symptoms worsen or fail to improve.  I have reviewed the above documentation for accuracy and completeness, and I agree with the above.   Jesse ScotlandAshley Jeny Nield, MD  [facility] [address], [address] [place], [place] [address] [phone]

## 2022-11-01 ENCOUNTER — Encounter: Payer: Self-pay | Admitting: Internal Medicine

## 2022-11-02 NOTE — Telephone Encounter (Signed)
I called and spoke with the pt and he has been having worsening SOB and bilateral peripheral edema... he says the swelling goes up to his knees... his rate had been elevated but today it is staying around 69.   He asked to be seen in [place].. I made him an appt with Sherri.   Pt will elevate his legs today. Watch his NA intake and call back or go to the ED if anything worsens prior to his appt.

## 2022-11-03 ENCOUNTER — Ambulatory Visit: Payer: Managed Care, Other (non HMO) | Attending: Cardiology | Admitting: Cardiology

## 2022-11-03 ENCOUNTER — Encounter: Payer: Self-pay | Admitting: Cardiology

## 2022-11-03 VITALS — BP 130/68 | HR 59 | Ht 69.0 in | Wt 235.4 lb

## 2022-11-03 DIAGNOSIS — I480 Paroxysmal atrial fibrillation: Secondary | ICD-10-CM | POA: Diagnosis not present

## 2022-11-03 DIAGNOSIS — E118 Type 2 diabetes mellitus with unspecified complications: Secondary | ICD-10-CM

## 2022-11-03 DIAGNOSIS — E782 Mixed hyperlipidemia: Secondary | ICD-10-CM

## 2022-11-03 DIAGNOSIS — I10 Essential (primary) hypertension: Secondary | ICD-10-CM

## 2022-11-03 DIAGNOSIS — G4733 Obstructive sleep apnea (adult) (pediatric): Secondary | ICD-10-CM

## 2022-11-03 DIAGNOSIS — R0602 Shortness of breath: Secondary | ICD-10-CM

## 2022-11-03 DIAGNOSIS — R011 Cardiac murmur, unspecified: Secondary | ICD-10-CM

## 2022-11-03 DIAGNOSIS — R600 Localized edema: Secondary | ICD-10-CM

## 2022-11-03 NOTE — Progress Notes (Unsigned)
Cardiology Office Note:   Date:  11/05/2022  ID:  Jesse Vargas, DOB [birth year], MRN [id number]  History of Present Illness:   Jesse Vargas is a 67 y.o. male with a past medical history of coronary artery disease by catheter 2007, essential hypertension, SVT (status post ablation for AVNRT in 2015), hyperlipidemia, obstructive sleep apnea, paroxysmal atrial fibrillation, diabetes, obesity, who presents today for worsening shortness of breath and peripheral edema.  Myoview in 2012 which was considered normal.  Chest pain in 2015 which led to heart catheterization, revealed 50 to 60% stenosis LAD.  Echocardiogram recently completed showed LVEF with normal wall motion and G2 diastolic dysfunction.  9/23 for ZIO monitor that showed atrial fibrillation 3% of the time.  He was last seen in clinic 06/27/2022 by Dr. Tenny Craw.  He was continued on his apixaban for stroke prophylaxis and Multaq.  He denies any chest discomfort.  He was set up for sleep study.  Further testing was ordered.  Patient called the triage line/25/24 with complaints of worsening shortness of breath and bilateral peripheral edema.  He states that his swelling is gone up to his knees.  Heart rate had been elevated but was averaging around 69.  He was scheduled for appointment today and was advised to elevate his legs and watch his sodium intake as well as given emergency department precautions.  He returns to clinic today statin that his heart rate has been increased and that he has been having more peripheral edema. With the increased swelling he has cut back on his sodium intake. He has been tracking his heart rate with his watch and an ap on his smart phone. There have been 1-2 days of an elevated heart rate each month noted via the app. He has noted shortness of breath with an increased heart rate. There have been no noted changes is his weight or early satiety. He has not noted any bleeding from being on Eliquis, no changes to urine  or stool color. Has been complaint with his medications. Denies any hospitalizations or visits to th emergency department or recent hospitalizations.  ROS: 10 point review of system has been completed and is considered negative with the exception of what has been listed the HPI  Studies Reviewed:    EKG: Sinus bradycardia with a rate of 59, no acute change from prior studies  TTE 01/12/22 1. Left ventricular ejection fraction, by estimation, is 60 to 65%. The  left ventricle has normal function. The left ventricle has no regional  wall motion abnormalities. There is mild concentric left ventricular  hypertrophy. Left ventricular diastolic  parameters are consistent with Grade II diastolic dysfunction  (pseudonormalization). The average left ventricular global longitudinal  strain is -26.7 %. The global longitudinal strain is normal.   2. Right ventricular systolic function is normal. The right ventricular  size is mildly enlarged. There is normal pulmonary artery systolic  pressure.   3. Left atrial size was moderately dilated.   4. Right atrial size was mild to moderately dilated.   5. The mitral valve is normal in structure. Trivial mitral valve  regurgitation. No evidence of mitral stenosis.   6. The aortic valve is tricuspid. There is mild calcification of the  aortic valve. Aortic valve regurgitation is not visualized. Aortic valve  sclerosis is present, with no evidence of aortic valve stenosis.   7. The inferior vena cava is normal in size with greater than 50%  respiratory variability, suggesting right atrial pressure of 3  mmHg.    Risk Assessment/Calculations:    CHA2DS2-VASc Score = 3   This indicates a 3.2% annual risk of stroke. The patient's score is based upon: CHF History: 0 HTN History: 1 Diabetes History: 1 Stroke History: 0 Vascular Disease History: 1 Age Score: 0 Gender Score: 0          Physical Exam:   VS:  BP 130/68 (BP Location: Left Arm, Patient  Position: Sitting, Cuff Size: Normal)   Pulse (!) 59   Ht 5\' 9"  (1.753 m)   Wt 235 lb 6.4 oz (106.8 kg)   SpO2 94%   BMI 34.76 kg/m    Wt Readings from Last 3 Encounters:  11/03/22 235 lb 6.4 oz (106.8 kg)  10/18/22 232 lb 8 oz (105.5 kg)  06/27/22 232 lb (105.2 kg)     GEN: Well nourished, well developed in no acute distress NECK: No JVD; No carotid bruits CARDIAC: RRR, II/VI systolic murmur, without rubs, gallops RESPIRATORY:  Clear to auscultation without rales, wheezing or rhonchi  ABDOMEN: Soft, non-tender, non-distended EXTREMITIES:  1+ pitting edema BLE from ankles to the mid calf; No deformity   ASSESSMENT AND PLAN:   Paroxysmal atrial fibrillation. Heart rate today 59 on EKG and has been controlled the last few days. He continues to have bursts of elevated heart rates when he over exerts himself and becomes short of breath, has caffeine, or after he has eaten. It is usually short lived with the exception f last week it was off and on for three days and then resolved. He continues to monitor with an app on his smart phone. He has been continued on carvedilol 25 mg twice daily, Eliquis 5 mg twice daily, and Multaq 400 mg twice daily.  Peripheral edema to the bilateral lower extremities with dietary indiscretions. He has noted an increase in his sodium intake which he states that his girlfriend has taken away since his swelling started. He also had on his medication list that he was taking amlodipine 10 mg twice daily which he states he was taking 10 mg daily, which could be contributing to the peripheral edema. He is going to take 5 mg in the AM and 5 mg in the PM to see if that helps his swelling along with decreasing his sodium intake, elevating his extremities, foot calf pumps, and compression socks.  Coronary artery disease that was noted to be nonobstructive on LHC in 2017. Denies any chest pain or anginal symptoms.   Essential hypertension with blood pressure today of 130/68.  With the change made to his amlodipine the remainder of his medication regimen as remained the same. He is also being sent for a BMET to evaluated kidney function and electrolytes.   Hyperlipidemia with LDL 48 09/19/22. He is continued on atorvastatin 40 mg daily and fenofibrate 160 mg daily and Zetia 10 mg daily. This continues to be followed by his PCP.   Type II diabetes with last A1c 6.3. This is followed by his PCP.  Disposition patient to return to clinic to see MD/APP in 4-6 weeks to reevaluate symptoms.         Signed, Kyriaki Moder, NP

## 2022-11-03 NOTE — Patient Instructions (Signed)
Medication Instructions:  Your physician has recommended you make the following change in your medication:   amLODipine (NORVASC) 10 MG tablet - Take 0.5 tablet by mouth twice daily  *If you need a refill on your cardiac medications before your next appointment, please call your pharmacy*  Lab Work: Your physician recommends that you get lab work at [facility]: BMET  If you have labs (blood work) drawn today and your tests are completely normal, you will receive your results only by: [employer] (if you have MyChart) OR A paper copy in the mail If you have any lab test that is abnormal or we need to change your treatment, we will call you to review the results.  Testing/Procedures: -None ordered  Follow-Up: At [facility], you and your health needs are our priority.  As part of our continuing mission to provide you with exceptional heart care, we have created designated Provider Care Teams.  These Care Teams include your primary Cardiologist (physician) and Advanced Practice Providers (APPs -  Physician Assistants and Nurse Practitioners) who all work together to provide you with the care you need, when you need it.  We recommend signing up for the patient portal called "MyChart".  Sign up information is provided on this After Visit Summary.  MyChart is used to connect with patients for Virtual Visits (Telemedicine).  Patients are able to view lab/test results, encounter notes, upcoming appointments, etc.  Non-urgent messages can be sent to your provider as well.   To learn more about what you can do with MyChart, go to [email or web address].    Your next appointment:   4 week(s)  Provider:   You may see Dietrich Pates, MD or one of the following Advanced Practice Providers on your designated Care Team:   Nicolasa Ducking, NP Eula Listen, PA-C Cadence Fransico Michael, PA-C Charlsie Quest, NP    Other Instructions -None

## 2022-11-04 LAB — BASIC METABOLIC PANEL
BUN/Creatinine Ratio: 12 (ref 10–24)
BUN: 16 mg/dL (ref 8–27)
CO2: 24 mmol/L (ref 20–29)
Calcium: 9.6 mg/dL (ref 8.6–10.2)
Chloride: 103 mmol/L (ref 96–106)
Creatinine, Ser: 1.32 mg/dL — ABNORMAL HIGH (ref 0.76–1.27)
Glucose: 124 mg/dL — ABNORMAL HIGH (ref 70–99)
Potassium: 4 mmol/L (ref 3.5–5.2)
Sodium: 143 mmol/L (ref 134–144)
eGFR: 60 mL/min/{1.73_m2} (ref >59)

## 2022-11-06 NOTE — Addendum Note (Signed)
Addended by: Auburn Bilberry D on: 11/06/2022 10:07 AM   Modules accepted: Orders

## 2022-12-06 ENCOUNTER — Ambulatory Visit: Payer: Managed Care, Other (non HMO) | Admitting: Cardiology

## 2022-12-06 NOTE — Progress Notes (Deleted)
Cardiology Office Note:   Date:  12/06/2022  ID:  Jesse Vargas, DOB 1956/02/02, MRN [id number]  History of Present Illness:   Jesse Vargas is a 67 y.o. male with a past medical history of coronary disease in 2007, essential hypertension, SVT (status post ablation for AVNRT, 2015), hyperlipidemia, obstructive sleep apnea, paroxysmal atrial fibrillation, diabetes, obesity, who presents today for follow-up.  Myoview in 2012 was considered low risk.  Chest pain in 2015 which led to heart catheterization, revealed 50 to 60% stenosis LAD.  Echocardiogram recently completed showed LVEF with normal wall motion and G2 DD.  9/23 ZIO monitor showed atrial fibrillation 3% of the time.  He was last seen in clinic 11/03/2022 after complaints of bilateral peripheral edema and worsening swelling.  He also stated that his heart rate had been increasing and he had been having more peripheral edema.  At that time he had noted an increase in his sodium intake he was taken amlodipine 10 mg daily which could have been contributing to his peripheral edema as he was going to take 5 mg a.m. and 5 mg in the p.m. to see if that helps as long as starting in conservative therapy.  He was also being sent for labs of a BMP to reevaluate his kidney function and electrolytes.  He returns to clinic today  ROS: ***  Studies Reviewed:    EKG:  *** Heart Monitor 04/2022 Predominant rhythm:  SInus rhythm     Rates 46 to 88 bpm  Average HR 61 bpm Atrial fib/flutter occurred 3% time    Max HR 152 bpm   Average HR 61 bpm Rare PVC Triggered events mostly occurred with atrial fib with RVR   One episode occurred with SR with PVC   Patient had a min HR of 46 bpm, max HR of 152 bpm, and avg HR of 61 bpm. Predominant underlying rhythm was Sinus Rhythm. Atrial Fibrillation/Flutter occurred (3% burden), ranging from 55-152 bpm (avg of 87 bpm), the longest lasting 8 hours 13 mins with  an avg rate of 85 bpm. Atrial  Fibrillation/Flutter was detected within +/- 45 seconds of symptomatic patient event(s). Isolated SVEs were rare (<1.0%), SVE Couplets were rare (<1.0%), and SVE Triplets were rare (<1.0%). Isolated VEs were rare (<1.0%,  378), VE Couplets were rare (<1.0%, 12), and VE Triplets were rare (<1.0%, 3).   TTE 01/12/22 1. Left ventricular ejection fraction, by estimation, is 60 to 65%. The  left ventricle has normal function. The left ventricle has no regional  wall motion abnormalities. There is mild concentric left ventricular  hypertrophy. Left ventricular diastolic  parameters are consistent with Grade II diastolic dysfunction  (pseudonormalization). The average left ventricular global longitudinal  strain is -26.7 %. The global longitudinal strain is normal.   2. Right ventricular systolic function is normal. The right ventricular  size is mildly enlarged. There is normal pulmonary artery systolic  pressure.   3. Left atrial size was moderately dilated.   4. Right atrial size was mild to moderately dilated.   5. The mitral valve is normal in structure. Trivial mitral valve  regurgitation. No evidence of mitral stenosis.   6. The aortic valve is tricuspid. There is mild calcification of the  aortic valve. Aortic valve regurgitation is not visualized. Aortic valve  sclerosis is present, with no evidence of aortic valve stenosis.   7. The inferior vena cava is normal in size with greater than 50%  respiratory variability, suggesting right atrial pressure  of 3 mmHg.   Risk Assessment/Calculations:   {Does this patient have ATRIAL FIBRILLATION?:[phone]} No BP recorded.  {Refresh Note OR Click here to enter BP  :1}***        Physical Exam:   VS:  There were no vitals taken for this visit.   Wt Readings from Last 3 Encounters:  11/03/22 235 lb 6.4 oz (106.8 kg)  10/18/22 232 lb 8 oz (105.5 kg)  06/27/22 232 lb (105.2 kg)     GEN: Well nourished, well developed in no acute  distress NECK: No JVD; No carotid bruits CARDIAC: ***RRR, no murmurs, rubs, gallops RESPIRATORY:  Clear to auscultation without rales, wheezing or rhonchi  ABDOMEN: Soft, non-tender, non-distended EXTREMITIES:  No edema; No deformity   ASSESSMENT AND PLAN:   ***    {Are you ordering a CV Procedure (e.g. stress test, cath, DCCV, TEE, etc)?   Press F2        :[id number]}   Signed, Jesse Hakala, NP

## 2023-01-03 ENCOUNTER — Ambulatory Visit: Payer: Managed Care, Other (non HMO) | Admitting: Cardiology

## 2023-01-25 ENCOUNTER — Encounter: Payer: Self-pay | Admitting: Cardiology

## 2023-01-25 ENCOUNTER — Ambulatory Visit: Payer: Managed Care, Other (non HMO) | Attending: Cardiology | Admitting: Cardiology

## 2023-01-25 VITALS — BP 150/80 | HR 69 | Ht 69.0 in | Wt 236.2 lb

## 2023-01-25 DIAGNOSIS — I10 Essential (primary) hypertension: Secondary | ICD-10-CM | POA: Diagnosis not present

## 2023-01-25 DIAGNOSIS — E118 Type 2 diabetes mellitus with unspecified complications: Secondary | ICD-10-CM | POA: Diagnosis not present

## 2023-01-25 DIAGNOSIS — R002 Palpitations: Secondary | ICD-10-CM

## 2023-01-25 DIAGNOSIS — Z7984 Long term (current) use of oral hypoglycemic drugs: Secondary | ICD-10-CM

## 2023-01-25 DIAGNOSIS — I480 Paroxysmal atrial fibrillation: Secondary | ICD-10-CM

## 2023-01-25 DIAGNOSIS — E782 Mixed hyperlipidemia: Secondary | ICD-10-CM

## 2023-01-25 DIAGNOSIS — G4733 Obstructive sleep apnea (adult) (pediatric): Secondary | ICD-10-CM | POA: Diagnosis not present

## 2023-01-25 DIAGNOSIS — I2510 Atherosclerotic heart disease of native coronary artery without angina pectoris: Secondary | ICD-10-CM

## 2023-01-25 MED ORDER — CARVEDILOL 25 MG PO TABS: 37.5000 mg | ORAL_TABLET | Freq: Two times a day (BID) | ORAL | 2 refills | Status: DC

## 2023-01-25 NOTE — Progress Notes (Unsigned)
Cardiology Office Note:  .   Date:  01/28/2023  ID:  Jesse Vargas, DOB 18-May-1956, MRN [id number] PCP: [name], MD  [facility] Providers Cardiologist:  Dietrich Pates, MD { Click to update primary MD,subspecialty MD or APP then REFRESH:1}   History of Present Illness: .   Jesse Vargas is a 67 y.o. male with a past medical history of coronary artery disease heart catheterization in 2007, essential hypertension, SVT (status post ablation for AVNRT in 2015), hyperlipidemia, obstructive sleep apnea, paroxysmal atrial fibrillation, diabetes, obesity, who presents today for follow-up.  Previous Myoview in 2012 was considered normal.  He was sent for chest pain in 2015 which led to heart catheterization which revealed 50-60% stenosis to the LAD.  Echocardiogram recently completed showed LVEF with normal wall motion and G2 diastolic dysfunction.  03/2022 he had a ZIO monitor which showed atrial fibrillation with a 3% burden at that time.  He was last seen in clinic 11/03/2022.  He was worried that his heart rate had been increased and he was having more peripheral edema.  With his increased swelling he had cut back on his sodium intake and been tracking his heart rate with his watch and then app on his smart phone.  They have been 1 to 2 days of elevated heart rate noted each month via the app.  Peripheral edema in the bilateral lower extremities was his bigger concern at that time amlodipine was changed to taking 5 mg in the a.m. and 5 mg in the p.m. as well as conservative therapy.  He returns to clinic today stating that he has been suffering from worsening peripheral edema, palpitations, fatigue, has been in atrial fibrillation more frequently, not being able to sleep well.  States that he has a long sanding history of sleep apnea and has not followed up with a recent sleep study or pulmonary for possible machine adjustments and supplies.  He has been compliant with his medications.  Has  not missed any of his apixaban.  Denies any bleeding with any blood noted in his stool or urine.  Does have questions concerning  ROS: 10 point review of systems has been reviewed and considered negative with exception of what is been listed in the HPI.  Studies Reviewed: Marland Kitchen   EKG Interpretation Date/Time:  Thursday January 25 2023 15:31:01 EDT Ventricular Rate:  69 PR Interval:  130 QRS Duration:  98 QT Interval:  442 QTC Calculation: 473 R Axis:   62  Text Interpretation: Sinus rhythm with Premature atrial complexes When compared with ECG of 07-Jun-2022 15:46, Premature atrial complexes are no longer Present Nonspecific T wave abnormality has replaced inverted T waves in Inferior leads Confirmed by Charlsie Quest ([id number]) on 01/25/2023 3:33:13 PM    TTE 01/12/22 1. Left ventricular ejection fraction, by estimation, is 60 to 65%. The  left ventricle has normal function. The left ventricle has no regional  wall motion abnormalities. There is mild concentric left ventricular  hypertrophy. Left ventricular diastolic  parameters are consistent with Grade II diastolic dysfunction  (pseudonormalization). The average left ventricular global longitudinal  strain is -26.7 %. The global longitudinal strain is normal.   2. Right ventricular systolic function is normal. The right ventricular  size is mildly enlarged. There is normal pulmonary artery systolic  pressure.   3. Left atrial size was moderately dilated.   4. Right atrial size was mild to moderately dilated.   5. The mitral valve is normal in structure. Trivial  mitral valve  regurgitation. No evidence of mitral stenosis.   6. The aortic valve is tricuspid. There is mild calcification of the  aortic valve. Aortic valve regurgitation is not visualized. Aortic valve  sclerosis is present, with no evidence of aortic valve stenosis.   7. The inferior vena cava is normal in size with greater than 50%  respiratory variability, suggesting right  atrial pressure of 3 mmHg. Risk Assessment/Calculations:    CHA2DS2-VASc Score = 3  {Confirm score is correct.  If not, click here to update score.  REFRESH note.  :1} This indicates a 3.2% annual risk of stroke. The patient's score is based upon: CHF History: 0 HTN History: 1 Diabetes History: 1 Stroke History: 0 Vascular Disease History: 1 Age Score: 0 Gender Score: 0   {This patient has a significant risk of stroke if diagnosed with atrial fibrillation.  Please consider VKA or DOAC agent for anticoagulation if the bleeding risk is acceptable.   You can also use the SmartPhrase .[email or web address] for documentation.   :[id number]} No BP recorded.  {Refresh Note OR Click here to enter BP  :1}***       Physical Exam:   VS:  BP (!) 152/81 (BP Location: Left Arm, Patient Position: Sitting, Cuff Size: Normal)   Pulse 69   Ht 5\' 9"  (1.753 m)   Wt 236 lb 3.2 oz (107.1 kg)   SpO2 95%   BMI 34.88 kg/m    Wt Readings from Last 3 Encounters:  01/25/23 236 lb 3.2 oz (107.1 kg)  11/03/22 235 lb 6.4 oz (106.8 kg)  10/18/22 232 lb 8 oz (105.5 kg)    GEN: Well nourished, well developed in no acute distress NECK: No JVD; No carotid bruits CARDIAC: ***RRR, no murmurs, rubs, gallops RESPIRATORY:  Clear to auscultation without rales, wheezing or rhonchi  ABDOMEN: Soft, non-tender, non-distended EXTREMITIES:  No edema; No deformity   ASSESSMENT AND PLAN: .   ***    {Are you ordering a CV Procedure (e.g. stress test, cath, DCCV, TEE, etc)?   Press F2        :[id number]}  Dispo: ***  Signed, Talonda Artist, NP

## 2023-01-25 NOTE — Patient Instructions (Signed)
Medication Instructions:  Increase Carvedilol to 37.5 mg twice daily   *If you need a refill on your cardiac medications before your next appointment, please call your pharmacy*   Follow-Up: At [facility], you and your health needs are our priority.  As part of our continuing mission to provide you with exceptional heart care, we have created designated Provider Care Teams.  These Care Teams include your primary Cardiologist (physician) and Advanced Practice Providers (APPs -  Physician Assistants and Nurse Practitioners) who all work together to provide you with the care you need, when you need it.  We recommend signing up for the patient portal called "MyChart".  Sign up information is provided on this After Visit Summary.  MyChart is used to connect with patients for Virtual Visits (Telemedicine).  Patients are able to view lab/test results, encounter notes, upcoming appointments, etc.  Non-urgent messages can be sent to your provider as well.   To learn more about what you can do with MyChart, go to [email or web address].    Your next appointment:   3 month(s)  Provider:   You may see Dietrich Pates, MD or one of the following Advanced Practice Providers on your designated Care Team:   Nicolasa Ducking, NP Eula Listen, PA-C Cadence Fransico Michael, PA-C Charlsie Quest, NP   Other Instructions Referral to Pulmonology- they will call you to set up an appointment.

## 2023-01-27 ENCOUNTER — Other Ambulatory Visit: Payer: Self-pay | Admitting: Nurse Practitioner

## 2023-01-28 ENCOUNTER — Encounter: Payer: Self-pay | Admitting: Cardiology

## 2023-01-28 NOTE — Progress Notes (Incomplete)
Cardiology Office Note:  .   Date:  01/28/2023  ID:  Jesse Vargas, DOB October 08, 1955, MRN [id number] PCP: Jesse Mina, MD  Jesse Vargas Providers Cardiologist:  Jesse Pates, MD { Click to update primary MD,subspecialty MD or APP then REFRESH:1}   History of Present Illness: .   Jesse Vargas is a 67 y.o. male with a past medical history of coronary artery disease heart catheterization in 2007, essential hypertension, SVT (status post ablation for AVNRT in 2015), hyperlipidemia, obstructive sleep apnea, paroxysmal atrial fibrillation, diabetes, obesity, who presents today for follow-up.  Previous Myoview in 2012 was considered normal.  He was sent for chest pain in 2015 which led to heart catheterization which revealed 50-60% stenosis to the LAD.  Echocardiogram recently completed showed LVEF with normal wall motion and G2 diastolic dysfunction.  03/2022 he had a ZIO monitor which showed atrial fibrillation with a 3% burden at that time.  He was last seen in clinic 11/03/2022.  He was worried that his heart rate had been increased and he was having more peripheral edema.  With his increased swelling he had cut back on his sodium intake and been tracking his heart rate with his watch and then app on his smart phone.  They have been 1 to 2 days of elevated heart rate noted each month via the app.  Peripheral edema in the bilateral lower extremities was his bigger concern at that time amlodipine was changed to taking 5 mg in the a.m. and 5 mg in the p.m. as well as conservative therapy.  He returns to clinic today stating that he has been suffering from worsening peripheral edema, palpitations, fatigue, has been in atrial fibrillation more frequently, not being able to sleep well.  States that he has a long sanding history of sleep apnea and has not followed up with a recent sleep study or pulmonary for possible machine adjustments and supplies.  He has been compliant with his medications.  Has  not missed any of his apixaban.  Denies any bleeding with any blood noted in his stool or urine.  Denies any hospitalizations or visits to the emergency department.  ROS: 10 point review of systems has been reviewed and considered negative with exception of what is been listed in the HPI.  Studies Reviewed: Marland Kitchen   EKG Interpretation Date/Time:  Thursday January 25 2023 15:31:01 EDT Ventricular Rate:  69 PR Interval:  130 QRS Duration:  98 QT Interval:  442 QTC Calculation: 473 R Axis:   62  Text Interpretation: Sinus rhythm with Premature atrial complexes When compared with ECG of 07-Jun-2022 15:46, Premature atrial complexes are no longer Present Nonspecific T wave abnormality has replaced inverted T waves in Inferior leads Confirmed by Jesse Vargas ([id number]) on 01/25/2023 3:33:13 PM    TTE 01/12/22 1. Left ventricular ejection fraction, by estimation, is 60 to 65%. The  left ventricle has normal function. The left ventricle has no regional  wall motion abnormalities. There is mild concentric left ventricular  hypertrophy. Left ventricular diastolic  parameters are consistent with Grade II diastolic dysfunction  (pseudonormalization). The average left ventricular global longitudinal  strain is -26.7 %. The global longitudinal strain is normal.   2. Right ventricular systolic function is normal. The right ventricular  size is mildly enlarged. There is normal pulmonary artery systolic  pressure.   3. Left atrial size was moderately dilated.   4. Right atrial size was mild to moderately dilated.   5. The mitral valve  is normal in structure. Trivial mitral valve  regurgitation. No evidence of mitral stenosis.   6. The aortic valve is tricuspid. There is mild calcification of the  aortic valve. Aortic valve regurgitation is not visualized. Aortic valve  sclerosis is present, with no evidence of aortic valve stenosis.   7. The inferior vena cava is normal in size with greater than 50%   respiratory variability, suggesting right atrial pressure of 3 mmHg. Risk Assessment/Calculations:    CHA2DS2-VASc Score = 3  {Confirm score is correct.  If not, click here to update score.  REFRESH note.  :1} This indicates a 3.2% annual risk of stroke. The patient's score is based upon: CHF History: 0 HTN History: 1 Diabetes History: 1 Stroke History: 0 Vascular Disease History: 1 Age Score: 0 Gender Score: 0   {This patient has a significant risk of stroke if diagnosed with atrial fibrillation.  Please consider VKA or DOAC agent for anticoagulation if the bleeding risk is acceptable.   You can also use the SmartPhrase .[email or web address] for documentation.   :[id number]}        Physical Exam:   VS:  BP (!) 150/80 (BP Location: Left Arm, Patient Position: Sitting, Cuff Size: Large)   Pulse 69   Ht 5\' 9"  (1.753 m)   Wt 236 lb 3.2 oz (107.1 kg)   SpO2 95%   BMI 34.88 kg/m    Wt Readings from Last 3 Encounters:  01/25/23 236 lb 3.2 oz (107.1 kg)  11/03/22 235 lb 6.4 oz (106.8 kg)  10/18/22 232 lb 8 oz (105.5 kg)    GEN: Well nourished, well developed in no acute distress NECK: No JVD; No carotid bruits CARDIAC: RRR, II/VI systolic murmur, without rubs or gallops RESPIRATORY:  Clear to auscultation without rales, wheezing or rhonchi  ABDOMEN: Soft, non-tender, non-distended EXTREMITIES:  1+ edema; No deformity   ASSESSMENT AND PLAN: .          Dispo: Patient to return to clinic to see MD/APP in 3 months or sooner if needed  Signed, Jesse Robben, NP

## 2023-01-30 IMAGING — US US RENAL
1 series · 14 of 17 positions shown · non-contrast
Comparison: 10/31/2019

CLINICAL DATA: Chronic kidney disease diabetes

EXAM:
RENAL / URINARY TRACT ULTRASOUND COMPLETE

[Series 1: us renal · 14 of 17 slices shown]
[im 1/17]
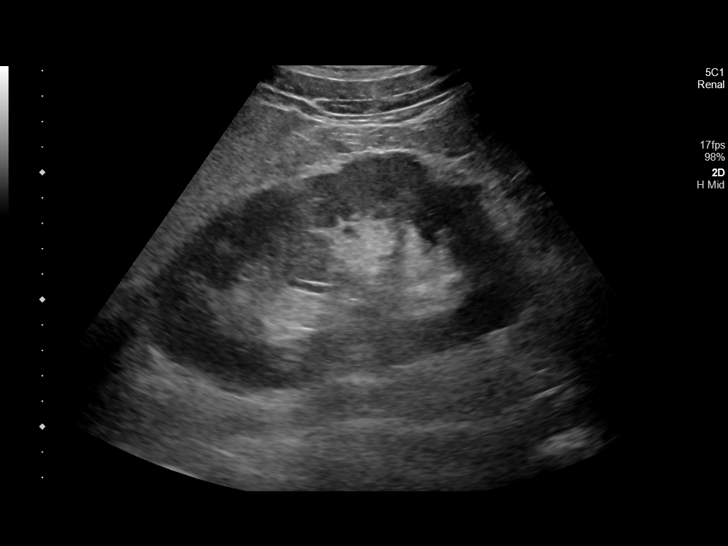
[im 2/17]
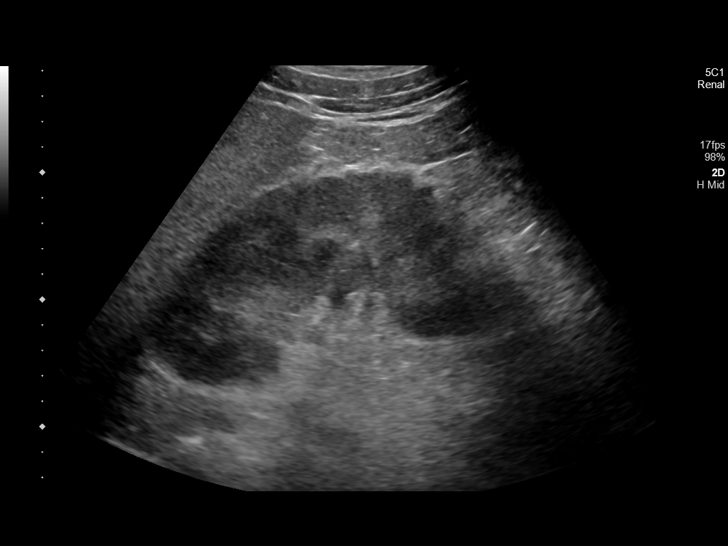
[im 4/17]
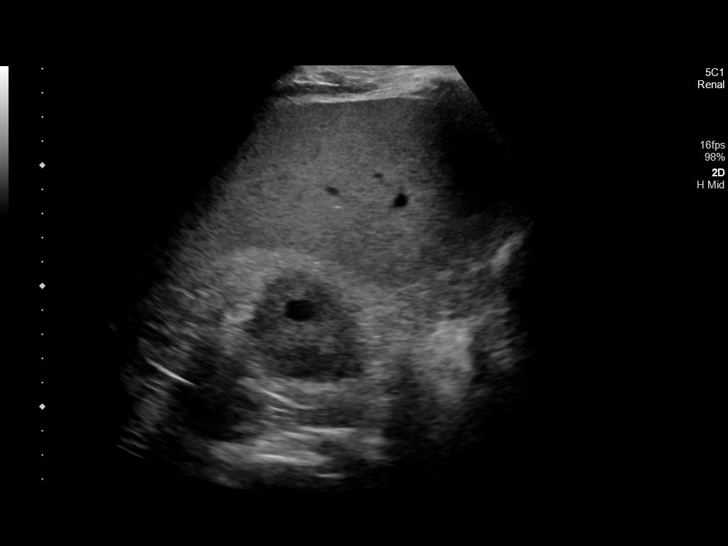
[im 5/17]
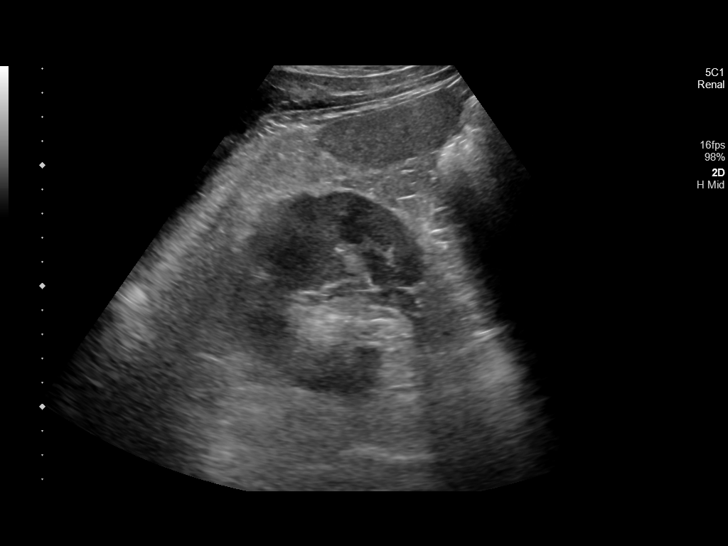
[im 6/17]
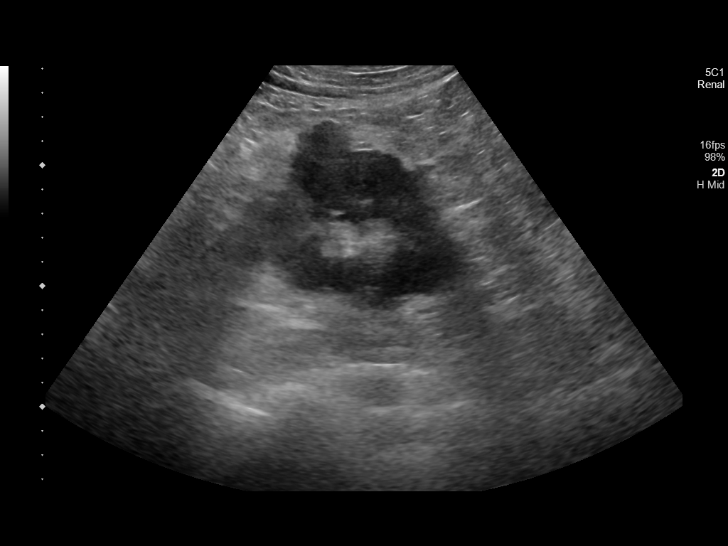
[im 7/17]
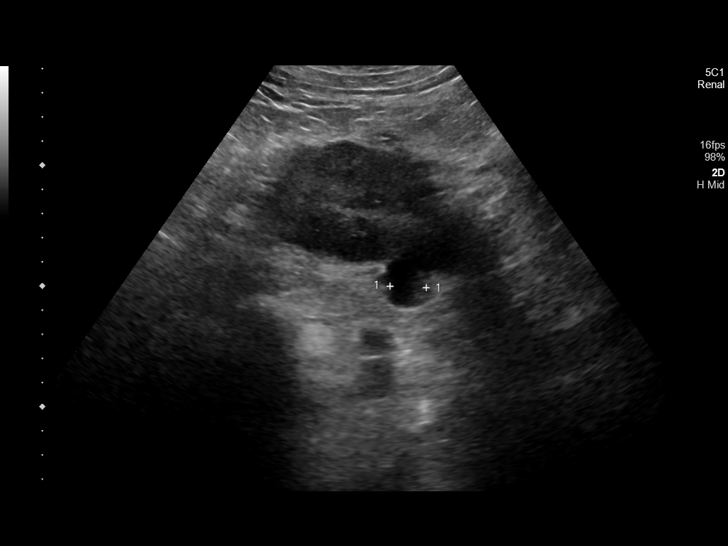
[im 8/17]
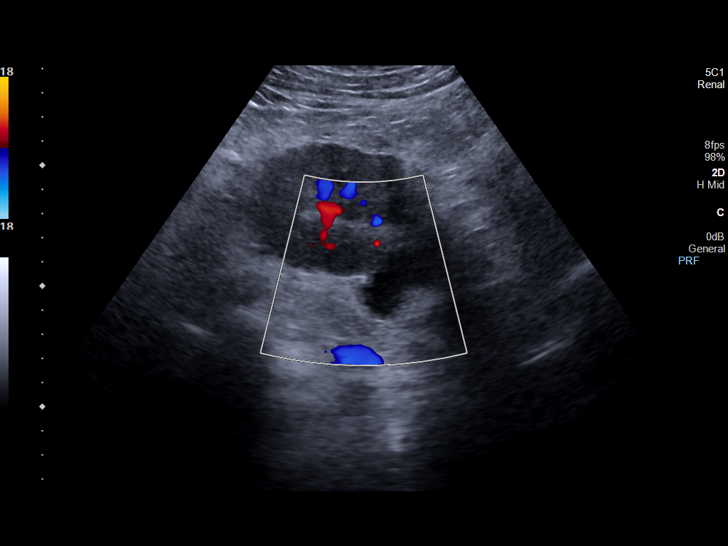
[im 10/17]
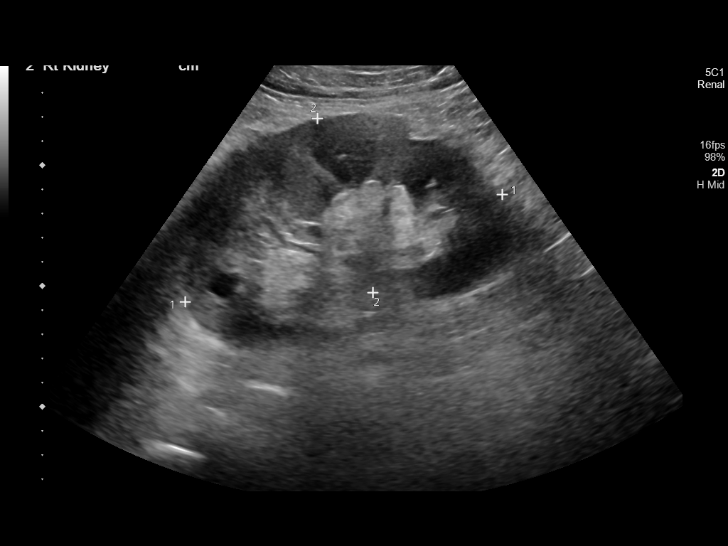
[im 11/17]
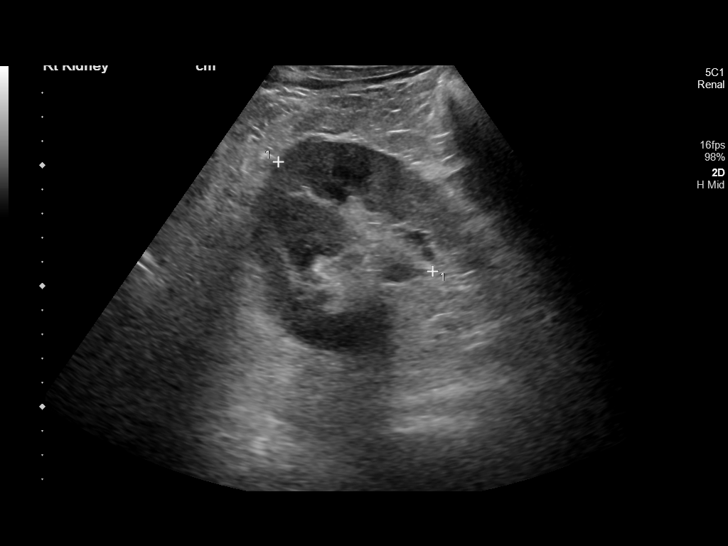
[im 12/17]
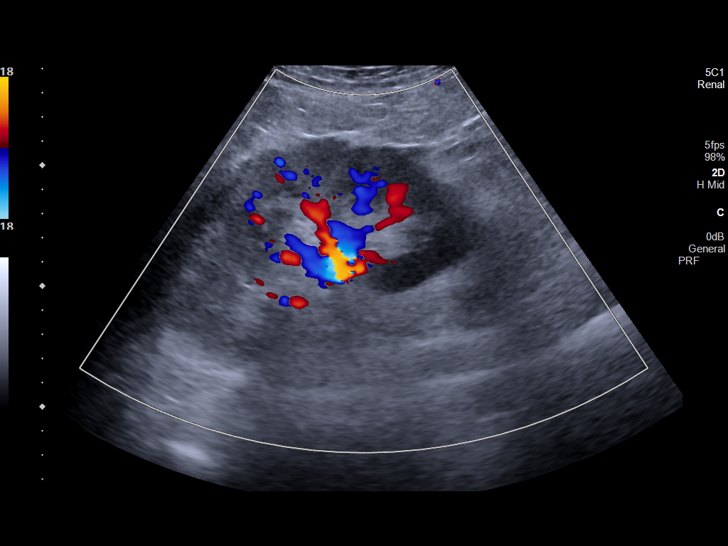
[im 13/17]
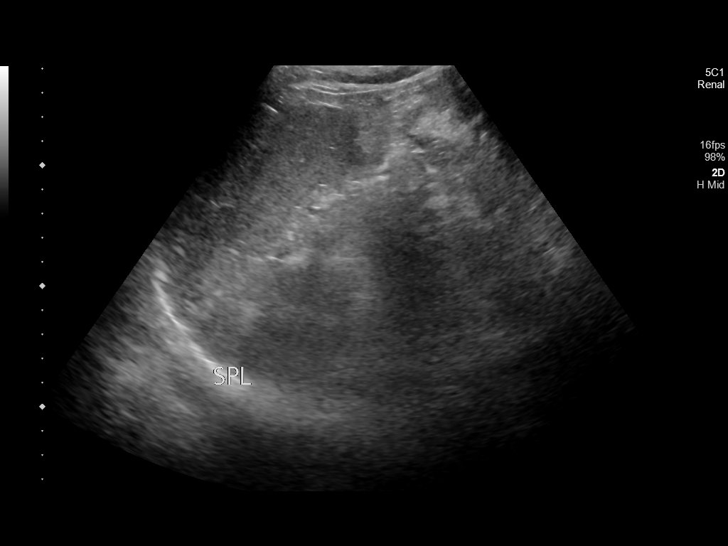
[im 14/17]
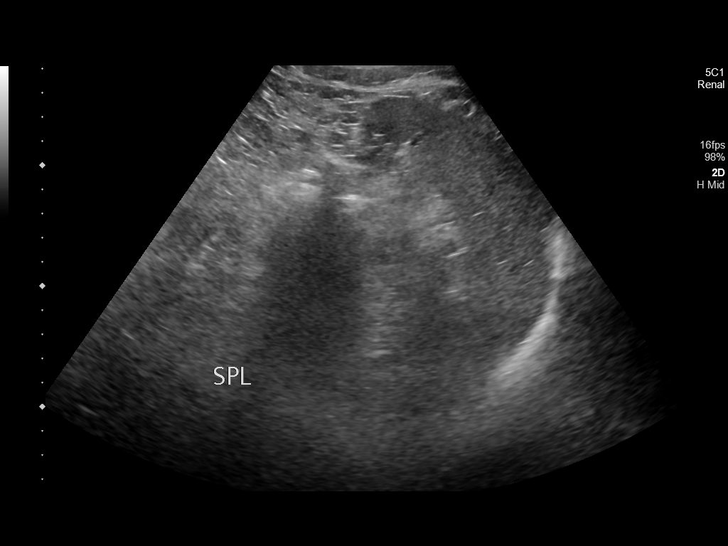
[im 16/17]
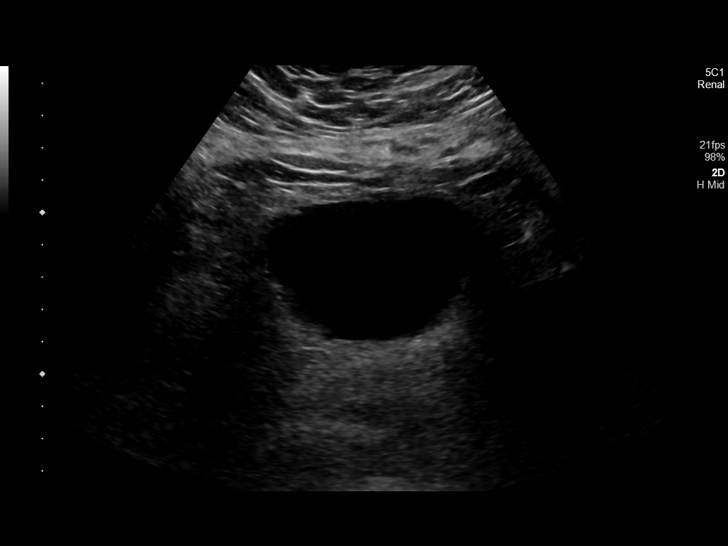
[im 17/17]
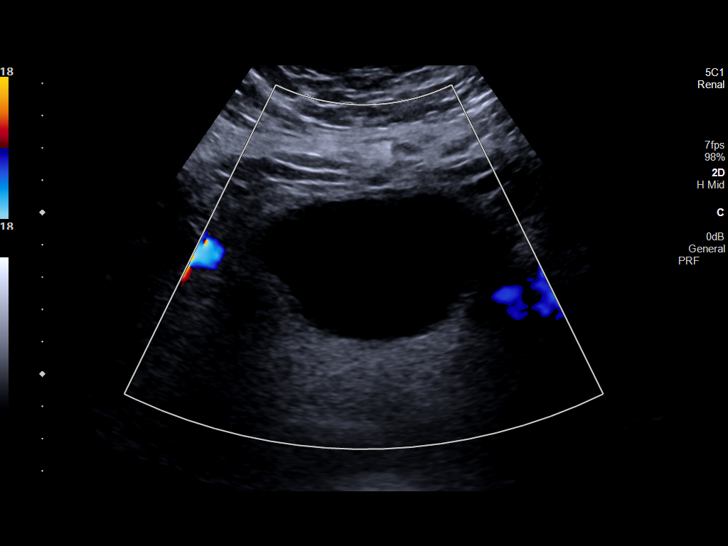

[14 of 17 positions shown; findings below may reference images not displayed]

FINDINGS: Right Kidney:

Renal measurements: 13.9 x 7.6 x 7.8 cm = volume: 431 mL.
Echogenicity within normal limits. No hydronephrosis. Cyst off the
lower pole measuring 19 mm

Left Kidney:

Surgically absent

Bladder:

Appears normal for degree of bladder distention.

Other:

None.
IMPRESSION: 1. Status post left nephrectomy
2. Small cyst right kidney.  Right kidney otherwise negative.

## 2023-02-01 ENCOUNTER — Encounter: Payer: Self-pay | Admitting: Internal Medicine

## 2023-02-03 NOTE — Telephone Encounter (Signed)
It may be benficial to heart, helping decrease cardiac risk factors

## 2023-02-07 NOTE — Telephone Encounter (Signed)
With CAD can say beneficial from cardiac standpoint

## 2023-02-09 ENCOUNTER — Encounter: Payer: Self-pay | Admitting: Pharmacist

## 2023-02-09 ENCOUNTER — Other Ambulatory Visit (HOSPITAL_COMMUNITY): Payer: Self-pay

## 2023-02-09 MED ORDER — MOUNJARO 2.5 MG/0.5ML ~~LOC~~ SOAJ: 2.5000 mg | SUBCUTANEOUS | 0 refills | Status: DC

## 2023-02-09 NOTE — Telephone Encounter (Signed)
This encounter was created in error - please disregard.

## 2023-02-09 NOTE — Addendum Note (Signed)
Addended by: ,  E on: 02/09/2023 12:04 PM   Modules accepted: Orders

## 2023-02-19 ENCOUNTER — Other Ambulatory Visit: Payer: Self-pay | Admitting: Internal Medicine

## 2023-02-20 NOTE — Telephone Encounter (Signed)
This is a [place] pt 

## 2023-03-19 ENCOUNTER — Other Ambulatory Visit: Payer: Self-pay | Admitting: Internal Medicine

## 2023-03-20 ENCOUNTER — Telehealth: Payer: Self-pay | Admitting: Pharmacist

## 2023-03-20 MED ORDER — MOUNJARO 5 MG/0.5ML ~~LOC~~ SOAJ: 5.0000 mg | SUBCUTANEOUS | 0 refills | Status: DC

## 2023-03-20 NOTE — Telephone Encounter (Signed)
Called pt to follow up with [facility] No side effects, tolerating med well, appetite and weight have decreased. Wishes to increase dose to 5mg . He will stop his low dose glipizide 2.5mg . Reports glucose readings have been normal. He thinks his insurance will only cover a 90 day rx, I have sent in 1 month and he will let me know if there's an issue and it has to be changed to a 90 day rx, ideally would prefer to keep titrating dose on a monthly basis. Pt appreciative for the call.

## 2023-03-28 ENCOUNTER — Telehealth: Payer: Self-pay | Admitting: Cardiology

## 2023-03-28 NOTE — Telephone Encounter (Signed)
Name: Jesse Vargas  DOB: 11-07-1955  MRN: [id number]  Primary Cardiologist: Dietrich Pates, MD  Chart reviewed as part of pre-operative protocol coverage. Because of Tyrance Branigan Mccamy's past medical history and time since last visit, he will require a follow-up in-office visit in order to better assess preoperative cardiovascular risk.  Pre-op covering staff: - Patient has three month follow up scheduled with Charlsie Quest, NP on 05/01/23, cardiac clearance has been requested for 04/30/23. Patient should either reschedule to a sooner appointment with Charlsie Quest, NP for cardiac clearance at that time or he can delay colonoscopy until after follow up.  - Please add "pre-op clearance" to the appointment notes so provider is aware. - Please contact requesting surgeon's office via preferred method (i.e, phone, fax) to inform them of need for appointment prior to surgery.  Patient with diagnosis of afib on Eliquis for anticoagulation.   Procedure: colonoscopy Date of procedure: 04/30/23  Per office protocol, patient can hold Eliquis for 2 days prior to procedure.     CHA2DS2-VASc Score = 3   This indicates a 3.2% annual risk of stroke. The patient's score is based upon: CHF History: 0 HTN History: 1 Diabetes History: 1 Stroke History: 0 Vascular Disease History: 1 Age Score: 0 Gender Score: 0 CrCl 67 ml/min  Rip Harbour, NP  03/28/2023, 4:29 PM

## 2023-03-28 NOTE — Telephone Encounter (Signed)
Patient with diagnosis of afib on Eliquis for anticoagulation.    Procedure: colonoscopy Date of procedure: 04/30/23   CHA2DS2-VASc Score = 3   This indicates a 3.2% annual risk of stroke. The patient's score is based upon: CHF History: 0 HTN History: 1 Diabetes History: 1 Stroke History: 0 Vascular Disease History: 1 Age Score: 0 Gender Score: 0      CrCl 67 ml/min  Per office protocol, patient can hold Eliquis for 2 days prior to procedure.    **This guidance is not considered finalized until pre-operative APP has relayed final recommendations.**

## 2023-03-28 NOTE — Telephone Encounter (Signed)
Pre-operative Risk Assessment    Patient Name: Jesse Vargas  DOB: 1955/09/27 MRN: [id number]      Request for Surgical Clearance    Procedure:  Colonoscopy  Date of Surgery:  Clearance 04/30/23                                 Surgeon:  not indicated Surgeon's Group or Practice Name:  [facility] Phone number:  [phone] Fax number:  [phone]   Type of Clearance Requested:   Pharmacy, Eliquis    Type of Anesthesia:  Not Indicated   Additional requests/questions:    Signed, Narda Amber   03/28/2023, 1:24 PM

## 2023-03-30 NOTE — Telephone Encounter (Signed)
Spoke with patient appointment has been moved up to 10-10 with [facility]

## 2023-04-05 ENCOUNTER — Other Ambulatory Visit: Payer: Self-pay | Admitting: Cardiology

## 2023-04-16 ENCOUNTER — Telehealth: Payer: Self-pay | Admitting: Pharmacist

## 2023-04-16 MED ORDER — MOUNJARO 7.5 MG/0.5ML ~~LOC~~ SOAJ: 7.5000 mg | SUBCUTANEOUS | 0 refills | Status: DC

## 2023-04-16 NOTE — Telephone Encounter (Signed)
Called pt and left message - following up with Mounjaro tolerability for potential dose titration. Will also need to make sure glucose has been stable since stopping glipizide last month.

## 2023-04-16 NOTE — Telephone Encounter (Signed)
Spoke with pt, reports he's losing weight, now under 220 lbs. Feels completely normal. A1c 5.7% last week in Care Everywhere, down from 6.3%. TG 186 down from 570. No low glucose readings. Will increase Mounjaro to 7.5mg  for next month.

## 2023-04-16 NOTE — Addendum Note (Signed)
Addended by: Emara Lichter E on: 04/16/2023 11:35 AM   Modules accepted: Orders

## 2023-04-19 ENCOUNTER — Ambulatory Visit: Payer: Managed Care, Other (non HMO) | Attending: Cardiology | Admitting: Cardiology

## 2023-04-19 ENCOUNTER — Encounter: Payer: Self-pay | Admitting: Cardiology

## 2023-04-19 VITALS — BP 102/60 | HR 97 | Ht 69.0 in | Wt 220.0 lb

## 2023-04-19 DIAGNOSIS — G4733 Obstructive sleep apnea (adult) (pediatric): Secondary | ICD-10-CM | POA: Diagnosis not present

## 2023-04-19 DIAGNOSIS — Z01810 Encounter for preprocedural cardiovascular examination: Secondary | ICD-10-CM

## 2023-04-19 DIAGNOSIS — I480 Paroxysmal atrial fibrillation: Secondary | ICD-10-CM

## 2023-04-19 DIAGNOSIS — E669 Obesity, unspecified: Secondary | ICD-10-CM

## 2023-04-19 DIAGNOSIS — I10 Essential (primary) hypertension: Secondary | ICD-10-CM

## 2023-04-19 DIAGNOSIS — I2510 Atherosclerotic heart disease of native coronary artery without angina pectoris: Secondary | ICD-10-CM | POA: Diagnosis not present

## 2023-04-19 DIAGNOSIS — E118 Type 2 diabetes mellitus with unspecified complications: Secondary | ICD-10-CM

## 2023-04-19 DIAGNOSIS — E782 Mixed hyperlipidemia: Secondary | ICD-10-CM

## 2023-04-19 NOTE — Patient Instructions (Signed)
Medication Instructions:  Your physician recommends that you continue on your current medications as directed. Please refer to the Current Medication list given to you today.  *If you need a refill on your cardiac medications before your next appointment, please call your pharmacy*  Lab Work: -None ordered  Testing/Procedures: -None ordered  Follow-Up: At [facility], you and your health needs are our priority.  As part of our continuing mission to provide you with exceptional heart care, we have created designated Provider Care Teams.  These Care Teams include your primary Cardiologist (physician) and Advanced Practice Providers (APPs -  Physician Assistants and Nurse Practitioners) who all work together to provide you with the care you need, when you need it.  Your next appointment:   3 month(s)  Provider:   Charlsie Quest, NP    Other Instructions -Cancel appointment for 05/05/23 please and thank you

## 2023-04-19 NOTE — Progress Notes (Signed)
Cardiology Office Note:  .   Date:  04/19/2023  ID:  Jesse Vargas, DOB 1955-11-08, MRN [id number] PCP: [name], MD  [facility] Providers Cardiologist:  Dietrich Pates, MD    History of Present Illness: .   Jesse Vargas is a 67 y.o. male with a past medical history of coronary artery disease last LHC 2007, send hypertension, SVT status post ablation for AVNRT (2015), hyperlipidemia, obstructive sleep apnea, paroxysmal atrial fibrillation, diabetes, obesity, who presents today for follow-up and preoperative cardiovascular examination.  Previous Myoview in 2012 was considered normal.  He was evaluated for chest pain in 2015 which led to [facility] which revealed a 50-60% stenosis to the LAD.  Echocardiogram revealed LVEF with normal wall motion G2 DD.  03/2022 he had a ZIO monitor which showed atrial fibrillation with a 3% burden at that time.  He was last seen in clinic 01/25/2019 for complaints of worsening peripheral edema, palpitations, fatigue, intermittent atrial fibrillation more frequently not been able to sleep well.  No medication changes that were made and further testing that was ordered.  With his increased palpitations had discussed with patient repeating ZIO monitor which she had preferred to follow-up with pulmonary prior to.  He returns to clinic today stating overall from a cardiac perspective he has been doing well.  Heart rates have been stable as he monitors them on his Apple watch and an app on his phone note.  He has not noted any bleeding from his apixaban with no blood in his urine or stool.  He did have to have a preoperative cardiovascular examination completed today for an upcoming colonoscopy.  He denies any chest pain, shortness of breath, peripheral edema, palpitations, lightheadedness or dizziness.  States that he has been compliant with his medications.  Denies any hospitalizations or visits to the emergency department.  ROS: 10 point review of systems has  been reviewed and considered negative with exception what is been listed in the HPI  Studies Reviewed: Marland Kitchen   EKG Interpretation Date/Time:  Thursday April 19 2023 07:53:51 EDT Ventricular Rate:  97 PR Interval:    QRS Duration:  90 QT Interval:  380 QTC Calculation: 482 R Axis:   45  Text Interpretation: Atrial fibrillation Prolonged QT When compared with ECG of 25-Jan-2023 15:31, Atrial fibrillation has replaced Sinus rhythm Confirmed by Charlsie Quest ([id number]) on 04/19/2023 8:02:56 AM    TTE 01/12/22 1. Left ventricular ejection fraction, by estimation, is 60 to 65%. The  left ventricle has normal function. The left ventricle has no regional  wall motion abnormalities. There is mild concentric left ventricular  hypertrophy. Left ventricular diastolic  parameters are consistent with Grade II diastolic dysfunction  (pseudonormalization). The average left ventricular global longitudinal  strain is -26.7 %. The global longitudinal strain is normal.   2. Right ventricular systolic function is normal. The right ventricular  size is mildly enlarged. There is normal pulmonary artery systolic  pressure.   3. Left atrial size was moderately dilated.   4. Right atrial size was mild to moderately dilated.   5. The mitral valve is normal in structure. Trivial mitral valve  regurgitation. No evidence of mitral stenosis.   6. The aortic valve is tricuspid. There is mild calcification of the  aortic valve. Aortic valve regurgitation is not visualized. Aortic valve  sclerosis is present, with no evidence of aortic valve stenosis.   7. The inferior vena cava is normal in size with greater than 50%  respiratory  variability, suggesting right atrial pressure of 3 mmHg. Risk Assessment/Calculations:    CHA2DS2-VASc Score = 3   This indicates a 3.2% annual risk of stroke. The patient's score is based upon: CHF History: 0 HTN History: 1 Diabetes History: 1 Stroke History: 0 Vascular Disease  History: 1 Age Score: 0 Gender Score: 0            Physical Exam:   VS:  BP 102/60 (BP Location: Left Arm, Patient Position: Sitting, Cuff Size: Normal)   Pulse 97   Ht 5\' 9"  (1.753 m)   Wt 220 lb (99.8 kg)   SpO2 95%   BMI 32.49 kg/m    Wt Readings from Last 3 Encounters:  04/19/23 220 lb (99.8 kg)  01/25/23 236 lb 3.2 oz (107.1 kg)  11/03/22 235 lb 6.4 oz (106.8 kg)    GEN: Well nourished, well developed in no acute distress NECK: No JVD; No carotid bruits CARDIAC: IR IR, II/VI systolic murmur without rubs or gallops RESPIRATORY:  Clear to auscultation without rales, wheezing or rhonchi  ABDOMEN: Soft, non-tender, non-distended EXTREMITIES:  No edema; No deformity   ASSESSMENT AND PLAN: .   Paroxysmal atrial fibrillation with occasional palpitations that have been well-controlled.  EKG today reveals rate controlled atrial fibrillation.  Patient continues to monitor heart rate with his watch.  He is continued on apixaban 5 mg twice daily for CHA2DS2-VASc score of at least 3, multi 400 mg twice daily, carvedilol 37.5 mg twice daily.  He has also been advised for his upcoming colonoscopy he can hold his apixaban for 2 days prior to the procedure.  Primary hypertension with blood pressure today of 102/60.  Since medication adjustments have been made the blood pressure has not been better controlled.  He is continued on carvedilol, amlodipine, and olmesartan HCTZ.  He has also been encouraged to continue to monitor his pressures 1 to 2 hours postmedication ministration at home as well.  Coronary artery disease that was nonobstructive on his last heart catheterization 2017.  Denies any anginal or anginal equivalents.  EKG today reveals atrial fibrillation without acute ischemic changes.  He is continued on apixaban and lieu of aspirin, statins, and beta-blocker therapy.  Hyperlipidemia with an LDL 48 which she remains at goal.  He is continued on atorvastatin 40 mg daily, fenofibrate  160 mg daily and ezetimibe 10 mg daily.  This continues to be managed by his PCP.  Obstructive sleep apnea with CPAP.  He has an upcoming follow-up with pulmonary.  As he has not had interrogation and updated settings for quite some time.  He has noted he has had bursts of atrial fibrillation throughout the night and morning which is likely contributed to obstructive sleep apnea and noncompliance with CPAP.  Hopefully with titration of CPAP will limit his burst of atrial fibrillation.  Type 2 diabetes with last hemoglobin A1c of 5.7.  He is continued on his current diabetes medications as well as majority of the continues to be managed by his PCP.  Obesity with a BMI of 32.49.  Since starting on majora no he has had a 16 pound weight loss since his last visit.  He has been congratulated on his weight loss and encouraged to continue with that journey.  Preoperative cardiovascular examination recorded ACC/AHA guidelines no further cardiovascular testing is needed and the patient may proceed with surgery at acceptable risk.    Mr. Symes's perioperative risk of a major cardiac event is 6.6% according to the Revised Cardiac  Risk Index (RCRI).  Therefore, he is at high risk for perioperative complications.   His functional capacity is fair at 5.19 METs according to the Duke Activity Status Index (DASI). Recommendations: According to ACC/AHA guidelines, no further cardiovascular testing needed.  The patient may proceed to surgery at acceptable risk.   Antiplatelet and/or Anticoagulation Recommendations: Eliquis (Apixaban) can be held for 2 days prior to surgery.  Please resume post op when felt to be safe.           Dispo: Patient to return to clinic to see MD/APP in 3 months or sooner if needed.  Signed, Stephen Baruch, NP

## 2023-04-30 ENCOUNTER — Ambulatory Visit: Payer: Managed Care, Other (non HMO)

## 2023-04-30 DIAGNOSIS — K635 Polyp of colon: Secondary | ICD-10-CM

## 2023-04-30 DIAGNOSIS — Z09 Encounter for follow-up examination after completed treatment for conditions other than malignant neoplasm: Secondary | ICD-10-CM

## 2023-04-30 DIAGNOSIS — D123 Benign neoplasm of transverse colon: Secondary | ICD-10-CM

## 2023-04-30 DIAGNOSIS — Z860101 Personal history of adenomatous and serrated colon polyps: Secondary | ICD-10-CM

## 2023-05-01 ENCOUNTER — Ambulatory Visit: Payer: Managed Care, Other (non HMO) | Admitting: Cardiology

## 2023-05-02 ENCOUNTER — Encounter: Payer: Self-pay | Admitting: Internal Medicine

## 2023-05-02 ENCOUNTER — Ambulatory Visit: Payer: Managed Care, Other (non HMO) | Admitting: Internal Medicine

## 2023-05-02 ENCOUNTER — Other Ambulatory Visit: Payer: Self-pay | Admitting: Internal Medicine

## 2023-05-02 VITALS — BP 92/64 | HR 96 | Temp 97.8°F | Ht 69.0 in | Wt 222.6 lb

## 2023-05-02 DIAGNOSIS — G4733 Obstructive sleep apnea (adult) (pediatric): Secondary | ICD-10-CM | POA: Diagnosis not present

## 2023-05-02 DIAGNOSIS — G4719 Other hypersomnia: Secondary | ICD-10-CM

## 2023-05-02 NOTE — Patient Instructions (Signed)
Obtain Split Night Sleep Study   Avoid secondhand smoke Avoid SICK contacts Recommend  Masking  when appropriate Recommend Keep up-to-date with vaccinations

## 2023-05-02 NOTE — Progress Notes (Signed)
Name: CLAIBORN MCCALIP MRN: [id number] DOB: 1956/04/08    CHIEF COMPLAINT:  EXCESSIVE DAYTIME SLEEPINESS   HISTORY OF PRESENT ILLNESS: Patient is seen today for problems and issues with sleep related to excessive daytime sleepiness Patient  has been having sleep problems for many years Patient has been having excessive daytime sleepiness for a long time Patient has been having extreme fatigue and tiredness, lack of energy +  very Loud snoring every night + struggling breathe at night and gasps for air + Nonrefreshing sleep  Patient diagnosed with underlying sleep apnea in 2014 Has been on CPAP machine Setting for unknown Patient has been problems with his machine He has less fatigue more energy with therapy   Encouraged proper weight management.  Discussed driving precautions and its relationship with hypersomnolence.  Discussed operating dangerous equipment and its relationship with hypersomnolence.  Discussed sleep hygiene, and benefits of a fixed sleep waked time.  The importance of getting eight or more hours of sleep discussed with patient.  Discussed limiting the use of the computer and television before bedtime.  Decrease naps during the day, so night time sleep will become enhanced.  Limit caffeine, and sleep deprivation.  HTN, stroke, and heart failure are potential risk factors.       PAST MEDICAL HISTORY :   has a past medical history of CAD (coronary artery disease), Cancer of kidney -status post nephrectomy, Chronic neck pain, Chronic pain syndrome, Diabetes mellitus, Family history of long QT syndrome, Fibromyalgia, GERD (gastroesophageal reflux disease), echocardiogram, Hyperlipidemia, Hypertension, Insomnia, Sleep apnea, and SVT (supraventricular tachycardia) (HCC).  has a past surgical history that includes Uvulopalatopharyngoplasty; Tonsillectomy; Cholecystectomy; Nephrectomy; Nasal sinus surgery; elbow surgery; Inner ear surgery; Ablation (08/27/13);  supraventricular tachycardia ablation (N/A, 08/27/2013); and left heart catheterization with coronary angiogram (N/A, 12/19/2013). Prior to Admission medications   Medication Sig Start Date End Date Taking? Authorizing Provider  ALPRAZolam (XANAX XR) 2 MG 24 hr tablet Take 2 mg by mouth at bedtime. 01/27/22   [provider]  amLODipine (NORVASC) 10 MG tablet Take 10 mg by mouth daily. 03/22/22   [provider]  apixaban (ELIQUIS) 5 MG TABS tablet Take 1 tablet (5 mg total) by mouth 2 (two) times daily. 04/27/22   Pricilla Riffle, MD  atorvastatin (LIPITOR) 40 MG tablet TAKE 1 TABLET BY MOUTH  DAILY AT 6 PM 03/30/16   Cook, Dorie Rank G, DO  carvedilol (COREG) 25 MG tablet TAKE 1 AND 1/2 TABLETS BY MOUTH  TWICE DAILY 01/29/23   Charlsie Quest, NP  cyclobenzaprine (FLEXERIL) 5 MG tablet Take 2.5 mg by mouth daily. 08/13/22   [provider]  dronedarone (MULTAQ) 400 MG tablet Take 1 tablet (400 mg total) by mouth 2 (two) times daily with a meal. 05/04/22   Pricilla Riffle, MD  ergocalciferol (VITAMIN D2) 1.25 MG (50000 UT) capsule Take 1 capsule by mouth once a week. 10/11/20   [provider]  escitalopram (LEXAPRO) 20 MG tablet Take 20 mg by mouth daily. 02/16/22   [provider]  ezetimibe (ZETIA) 10 MG tablet TAKE 1 TABLET BY MOUTH DAILY 06/21/22   Pricilla Riffle, MD  fenofibrate 160 MG tablet Take 1 tablet by mouth  daily 03/30/16   Tommie Sams, DO  fluticasone (FLONASE) 50 MCG/ACT nasal spray Place 2 sprays into both nostrils daily. 06/10/15   Carollee Leitz, RN  gabapentin (NEURONTIN) 300 MG capsule Take 1 capsule by mouth 2 (two) times daily as needed. 09/26/22  [provider]  icosapent Ethyl (VASCEPA) 1 g capsule TAKE 2 CAPSULES(2 GRAMS) BY MOUTH TWICE DAILY 02/21/23   Charlsie Quest, NP  meloxicam (MOBIC) 15 MG tablet Take 1 tablet by mouth  daily 06/28/15   Sherlene Shams, MD  metFORMIN (GLUCOPHAGE) 1000 MG tablet 1 tablet in the morning and 1 1/2  tablets in the evening    [provider]  olmesartan-hydrochlorothiazide (BENICAR HCT) 40-25 MG tablet Take 1 tablet by mouth daily. 02/20/22   [provider]  omeprazole (PRILOSEC) 20 MG capsule Take 1 capsule by mouth daily. 03/16/22   [provider]  PROAIR HFA 108 (90 BASE) MCG/ACT inhaler INHALE 2 PUFFS INTO THE  LUNGS 4 TIMES DAILY AS  NEEDED FOR WHEEZING. 11/10/14   Carollee Leitz, RN  tadalafil (CIALIS) 20 MG tablet Take 20 mg by mouth daily as needed. 03/22/22   [provider]  tirzepatide Greggory Keen) 7.5 MG/0.5ML Pen Inject 7.5 mg into the skin once a week. 04/16/23   Pricilla Riffle, MD   No Known Allergies  FAMILY HISTORY:  family history includes Colon polyps in his father; Diabetes in his father and mother; Hyperlipidemia in his father; Hypertension in his father and mother. SOCIAL HISTORY:  reports that he quit smoking about 25 years ago. His smoking use included cigarettes. He started smoking about 50 years ago. He has a 50 pack-year smoking history. He has never used smokeless tobacco. He reports current alcohol use. He reports that he does not use drugs.   Review of Systems:  Gen:  Denies  fever, sweats, chills weight loss  HEENT: Denies blurred vision, double vision, ear pain, eye pain, hearing loss, nose bleeds, sore throat Cardiac:  No dizziness, chest pain or heaviness, chest tightness,edema, No JVD Resp:   No cough, -sputum production, -shortness of breath,-wheezing, -hemoptysis,  Gi: Denies swallowing difficulty, stomach pain, nausea or vomiting, diarrhea, constipation, bowel incontinence Gu:  Denies bladder incontinence, burning urine Ext:   Denies Joint pain, stiffness or swelling Skin: Denies  skin rash, easy bruising or bleeding or hives Endoc:  Denies polyuria, polydipsia , polyphagia or weight change Psych:   Denies depression, insomnia or hallucinations  Other:  All other systems negative   ALL OTHER ROS ARE NEGATIVE   BP  92/64 (BP Location: Left Arm, Patient Position: Sitting, Cuff Size: Normal)   Pulse 96   Temp 97.8 F (36.6 C) (Temporal)   Ht 5\' 9"  (1.753 m)   Wt 222 lb 9.6 oz (101 kg)   SpO2 96%   BMI 32.87 kg/m     Physical Examination:   General Appearance: No distress  EYES PERRLA, EOM intact.   NECK Supple, No JVD Pulmonary: normal breath sounds, No wheezing.  CardiovascularNormal S1,S2.  No m/r/g.   Abdomen: Benign, Soft, non-tender. Skin:   warm, no rashes, no ecchymosis  Extremities: normal, no cyanosis, clubbing. Neuro:without focal findings,  speech normal  PSYCHIATRIC: Mood, affect within normal limits.   ALL OTHER ROS ARE NEGATIVE    ASSESSMENT AND PLAN SYNOPSIS  Patient with signs and symptoms of excessive daytime sleepiness with  underlying diagnosis of obstructive sleep apnea patient has a diagnosis of OSA since 2014 however settings are unknown I think it is beneficial for patient to obtain a split-night sleep study Patient states he cannot go 1 day without using his CPAP machine   Plan for split-night sleep study  Obesity -recommend significant weight loss -recommend changing diet  Deconditioned state -Recommend increased daily  activity and exercise   MEDICATION ADJUSTMENTS/LABS AND TESTS ORDERED: Split-night sleep study   CURRENT MEDICATIONS REVIEWED AT LENGTH WITH PATIENT TODAY   Patient  satisfied with Plan of action and management. All questions answered  Follow up  3 months  Total Time Spent  35 mins   Wallis Bamberg Santiago Glad, M.D.  [facility]  Medical Director [facility] Medical Director [facility] Cardio-Pulmonary Department

## 2023-05-03 NOTE — Telephone Encounter (Signed)
Prescription refill request for Eliquis received. Indication: PAF Last office visit: 04/19/23  Lionel December NP Scr: 1.32 on 11/03/22  Epic Age: 67 Weight: 99.8kg  Based on above findings Eliquis 5mg  twice daily is the appropriate dose.  Refill approved.

## 2023-05-16 ENCOUNTER — Encounter: Payer: Self-pay | Admitting: Pharmacist

## 2023-05-21 ENCOUNTER — Other Ambulatory Visit: Payer: Self-pay | Admitting: Internal Medicine

## 2023-05-21 NOTE — Telephone Encounter (Signed)
Mychart message sent to see if he would like to increase to 10mg 

## 2023-05-22 ENCOUNTER — Telehealth: Payer: Self-pay | Admitting: Internal Medicine

## 2023-05-22 DIAGNOSIS — G4733 Obstructive sleep apnea (adult) (pediatric): Secondary | ICD-10-CM

## 2023-05-22 NOTE — Telephone Encounter (Signed)
Pt calling in bc his CPAP motor life has extended life

## 2023-05-22 NOTE — Telephone Encounter (Signed)
Patient seen 05/02/2023 and split night sleep study was ordered.  Patient is requesting order for new cpap, as machine has exceeded motor life.   Dr. Belia Heman, please advise if okay to order or do you want to wait until after sleep study. Thanks

## 2023-05-23 NOTE — Telephone Encounter (Signed)
Pt is new to our office. I do not see record of current cpap settings.   Dr. Belia Heman, please advise. Thanks

## 2023-05-24 NOTE — Telephone Encounter (Signed)
Spoke to patient and relayed below message/recommendations. He would like to keep settings at 8-20cm.  Dr. Belia Heman, please advise. Thanks

## 2023-05-24 NOTE — Telephone Encounter (Signed)
Patient would like to increase to 10mg . Rx sent. Follow up in 4 weeks

## 2023-05-24 NOTE — Telephone Encounter (Signed)
Spoke to patient and relayed below message/recommendations. He stated that current settings are 8-20. He does not feel that 5-10 would be enough.  Dr. Belia Heman, please advise. Thanks

## 2023-05-24 NOTE — Telephone Encounter (Signed)
Patient states sleep study done 07/12/2014. Patient phone number is [phone].

## 2023-05-24 NOTE — Telephone Encounter (Signed)
Noted. Results are available in epic.

## 2023-05-25 NOTE — Telephone Encounter (Signed)
Order has been placed. Pt is aware and voiced his understanding.  Nothing further needed.

## 2023-05-28 ENCOUNTER — Telehealth: Payer: Self-pay | Admitting: Internal Medicine

## 2023-05-28 DIAGNOSIS — G4719 Other hypersomnia: Secondary | ICD-10-CM

## 2023-05-28 DIAGNOSIS — G4733 Obstructive sleep apnea (adult) (pediatric): Secondary | ICD-10-CM

## 2023-05-28 NOTE — Telephone Encounter (Signed)
We will need a HSt order placed to schedule this

## 2023-05-28 NOTE — Telephone Encounter (Signed)
Sleep works says in lab sleep study denied. Requested a HST order on 05/19/23

## 2023-05-29 NOTE — Telephone Encounter (Signed)
Dr. Belia Heman, please advise if okay to order HST.

## 2023-05-30 NOTE — Telephone Encounter (Signed)
HST ordered. Patient is aware and voiced his understanding. Nothing further needed.

## 2023-06-15 DIAGNOSIS — G4719 Other hypersomnia: Secondary | ICD-10-CM

## 2023-06-20 ENCOUNTER — Other Ambulatory Visit: Payer: Self-pay | Admitting: Internal Medicine

## 2023-06-21 ENCOUNTER — Other Ambulatory Visit: Payer: Self-pay | Admitting: Internal Medicine

## 2023-06-21 MED ORDER — ICOSAPENT ETHYL 1 G PO CAPS: ORAL_CAPSULE | ORAL | 3 refills | Status: AC

## 2023-06-22 ENCOUNTER — Telehealth: Payer: Self-pay | Admitting: Pharmacist

## 2023-06-22 MED ORDER — MOUNJARO 10 MG/0.5ML ~~LOC~~ SOAJ: 10.0000 mg | SUBCUTANEOUS | 11 refills | Status: DC

## 2023-06-22 NOTE — Telephone Encounter (Signed)
I spoke with patient. He would like to stay at 10mg  of Mounjaro. Does not think his stomach could handle the higher dose. He is losing weight. Will send in 10mg  Rx with refills. Pt will call if he needs higher/lower dose.

## 2023-06-22 NOTE — Telephone Encounter (Signed)
Is this one that we handle????

## 2023-06-25 ENCOUNTER — Other Ambulatory Visit: Payer: Self-pay

## 2023-06-25 MED ORDER — CARVEDILOL 25 MG PO TABS: 37.5000 mg | ORAL_TABLET | Freq: Two times a day (BID) | ORAL | 0 refills | Status: DC

## 2023-06-28 NOTE — Telephone Encounter (Signed)
Reduce the amlodipine to 5 mg daily and coreg to 25 mg twice daily. Continue to monitor blood pressures 1-2 hours post medication administration. If he continues to remain low he should reach out again so we can make further reductions in dosages.

## 2023-07-09 ENCOUNTER — Encounter: Payer: Self-pay | Admitting: Internal Medicine

## 2023-07-09 NOTE — Telephone Encounter (Signed)
She has mild obstructive sleep apnea and does have some modest desaturations during sleep.  She will need to discuss with Dr. Belia Heman what optimal treatments will be.

## 2023-07-09 NOTE — Telephone Encounter (Signed)
HST has been scanned in.   Dr. Belia Heman is out of the office. Dr. Jayme Cloud please advise.

## 2023-07-10 NOTE — Telephone Encounter (Signed)
error 

## 2023-07-20 ENCOUNTER — Ambulatory Visit: Payer: Managed Care, Other (non HMO) | Admitting: Cardiology

## 2023-07-20 NOTE — Telephone Encounter (Signed)
 Dr. Isaiah, this patient had a sleep study to confirm what pressure setting he needed to be on. You ordered a slip-night but insurance denied it so he had a home sleep study. He is currently on pressure setting of 8 cmH2O. I have copied his compliance report for you to review. Please advise is this is the correct setting for him.

## 2023-07-25 ENCOUNTER — Ambulatory Visit: Payer: Managed Care, Other (non HMO) | Admitting: Internal Medicine

## 2023-08-14 ENCOUNTER — Encounter: Payer: Self-pay | Admitting: Cardiology

## 2023-08-14 ENCOUNTER — Ambulatory Visit: Payer: Managed Care, Other (non HMO) | Attending: Cardiology | Admitting: Cardiology

## 2023-08-14 VITALS — BP 108/64 | HR 102 | Ht 69.0 in | Wt 203.2 lb

## 2023-08-14 DIAGNOSIS — G4733 Obstructive sleep apnea (adult) (pediatric): Secondary | ICD-10-CM | POA: Diagnosis not present

## 2023-08-14 DIAGNOSIS — I480 Paroxysmal atrial fibrillation: Secondary | ICD-10-CM | POA: Diagnosis not present

## 2023-08-14 DIAGNOSIS — E782 Mixed hyperlipidemia: Secondary | ICD-10-CM

## 2023-08-14 DIAGNOSIS — I951 Orthostatic hypotension: Secondary | ICD-10-CM

## 2023-08-14 DIAGNOSIS — I10 Essential (primary) hypertension: Secondary | ICD-10-CM

## 2023-08-14 DIAGNOSIS — E118 Type 2 diabetes mellitus with unspecified complications: Secondary | ICD-10-CM | POA: Diagnosis not present

## 2023-08-14 DIAGNOSIS — E669 Obesity, unspecified: Secondary | ICD-10-CM

## 2023-08-14 DIAGNOSIS — I2510 Atherosclerotic heart disease of native coronary artery without angina pectoris: Secondary | ICD-10-CM | POA: Diagnosis not present

## 2023-08-14 DIAGNOSIS — R42 Dizziness and giddiness: Secondary | ICD-10-CM

## 2023-08-14 MED ORDER — AMLODIPINE BESYLATE 5 MG PO TABS: 5.0000 mg | ORAL_TABLET | Freq: Every day | ORAL | 3 refills | Status: DC

## 2023-08-14 NOTE — Progress Notes (Signed)
 Cardiology Office Note:  .   Date:  08/14/2023  ID:  Jesse Vargas, DOB 1956-06-20, MRN [id number] PCP: Valora Agent, MD  [facility] Providers Cardiologist:  Vina Gull, MD    History of Present Illness: .   Jesse Vargas is a 68 y.o. male with a past medical history of coronary disease last heart catheterization 2007, primary hypertension, SVT status post ablation for AVNRT (2015), hyperlipidemia, OSA, paroxysmal atrial fibrillation, type 2 diabetes, obesity, who presents today for follow-up of his coronary artery disease and paroxysmal atrial fibrillation.   Previous Myoview  in 2012 was considered normal.  He was evaluated for chest pain in 2015 which led to [facility] which revealed a 50-60% stenosis to the LAD.  Echocardiogram revealed LVEF with normal wall motion G2 DD.  03/2022 he had a ZIO monitor which showed atrial fibrillation with a 3% burden at that time.   He was last seen in clinic 01/25/2019 for complaints of worsening peripheral edema, palpitations, fatigue, intermittent atrial fibrillation more frequently not been able to sleep well.  No medication changes that were made and further testing that was ordered.  With his increased palpitations had discussed with patient repeating ZIO monitor which she had preferred to follow-up with pulmonary prior to.   He was last seen in clinic 04/19/2023 and overall for the cardiac perspective been doing well.  Heart rates have been stable to continue to monitor him on his Apple Watch.  He is continued on his current medication without changes that were needed at that time he also required no further testing.  He returns to clinic today stating that overall he has been ok. He has been losing weight over the last several months since being on Mounjaro .  He states that he has been having dizziness primarily when bending over and standing back up when he has been at work.  He stated it has been ongoing for the past several weeks.  His Apple  Watch has alerted him that he has been back in atrial fibrillation with varying heart rates.  He also has left rib pain and 1 point that he can pinpoint.  Has actually taken himself off of muscle relaxers prior to for concern of issues with his kidneys.  Endorses fatigue but denies any chest pain, palpitations, chest pressure, or shortness of breath with.  States that he occasionally has peripheral edema but is likely a side effect from his amlodipine .  States that he has been compliant with his current medication regimen.  States that he has not missed any doses of his apixaban  and denies any issues with bleeding with no blood noted in his urine or stool.  Denies any recent hospitalizations or visits to the emergency department.  ROS: 10 point review of systems has been reviewed and considered negative except what is been listed in the HPI  Studies Reviewed: SABRA   EKG Interpretation Date/Time:  Tuesday August 14 2023 15:42:12 EST Ventricular Rate:  102 PR Interval:    QRS Duration:  88 QT Interval:  362 QTC Calculation: 471 R Axis:   67  Text Interpretation: Atrial flutter with variable A-V block When compared with ECG of 19-Apr-2023 07:53, Atrial flutter has replaced Atrial fibrillation Confirmed by Gerard Frederick ([id number]) on 08/14/2023 4:11:34 PM    TTE 01/12/22 1. Left ventricular ejection fraction, by estimation, is 60 to 65%. The  left ventricle has normal function. The left ventricle has no regional  wall motion abnormalities. There is mild concentric left  ventricular  hypertrophy. Left ventricular diastolic  parameters are consistent with Grade II diastolic dysfunction  (pseudonormalization). The average left ventricular global longitudinal  strain is -26.7 %. The global longitudinal strain is normal.   2. Right ventricular systolic function is normal. The right ventricular  size is mildly enlarged. There is normal pulmonary artery systolic  pressure.   3. Left atrial size was  moderately dilated.   4. Right atrial size was mild to moderately dilated.   5. The mitral valve is normal in structure. Trivial mitral valve  regurgitation. No evidence of mitral stenosis.   6. The aortic valve is tricuspid. There is mild calcification of the  aortic valve. Aortic valve regurgitation is not visualized. Aortic valve  sclerosis is present, with no evidence of aortic valve stenosis.   7. The inferior vena cava is normal in size with greater than 50%  respiratory variability, suggesting right atrial pressure of 3 mmHg. Risk Assessment/Calculations:    CHA2DS2-VASc Score = 3   This indicates a 3.2% annual risk of stroke. The patient's score is based upon: CHF History: 0 HTN History: 1 Diabetes History: 1 Stroke History: 0 Vascular Disease History: 1 Age Score: 0 Gender Score: 0            Physical Exam:   VS:  BP 108/64   Pulse (!) 102   Ht 5' 9 (1.753 m)   Wt 203 lb 3.2 oz (92.2 kg)   SpO2 96%   BMI 30.01 kg/m    Wt Readings from Last 3 Encounters:  08/14/23 203 lb 3.2 oz (92.2 kg)  05/02/23 222 lb 9.6 oz (101 kg)  04/19/23 220 lb (99.8 kg)    GEN: Well nourished, well developed in no acute distress NECK: No JVD; No carotid bruits CARDIAC: IR IR , II/VI systolic murmur RUSB without rubs or gallops RESPIRATORY:  Clear to auscultation without rales, wheezing or rhonchi  ABDOMEN: Soft, non-tender, non-distended EXTREMITIES: Trace pretibial edema; No deformity   ASSESSMENT AND PLAN: .   Lightheadedness/dizziness with associated orthostasis.  Typically has dizziness that complaints events back up.  Notable drop in blood pressure noted during orthostatic vitals.  With continued orthostasis his amlodipine  is being dropped to 5 mg daily as his carvedilol  has already been decreased back from 37-1/2 mg twice daily down to 25 mg daily.  He is also no longer taking Mobic  or Flexeril .  He has been sent for follow-up CBC be met today to rule out anemia as well as  reevaluate kidney function and electrolyte abnormalities.  Paroxysmal atrial fibrillation atrial flutter with EKG done today revealed varying block atrial flutter that is rate controlled.  He has been continued on apixaban  5 mg twice daily for CHA2DS2-VASc score of at least 3 for stroke prophylaxis.  He is also continued on carvedilol  25 mg twice daily and unfortunately Multaq  400 mg twice daily.  We did discuss his Multaq  as well as if he continues to remain in atrial flutter without coming back and that he may benefit from a cardioversion.  Coronary artery disease of the native coronary artery without angina.  EKG today reveals atrial flutter with no ischemic changes.  Patient does have discomfort but is not in the left rib cage not cardiovascular pain.  He is continued on apixaban  and lieu of aspirin  and atorvastatin  40 mg daily.  Hyperlipidemia with last LDL of 76.  He is continued on atorvastatin  40 mg daily and fenofibrate  160 mg daily.  He will need to  do lipid panel ordered on return.  Hypertension with episodes of orthostatic hypotension.  Amlodipine  has been decreased from 10 mg daily to 5 mg daily, carvedilol  25 mg twice daily, and olmesartan HCTZ 40-25 mg daily.  He has been encouraged to continue to monitor his pressures 1 to 2 hours postmedication ministration as well.  Obstructive sleep apnea where he states he has been compliant with his CPAP.  Type 2 diabetes where he is continued on metformin  1000 mg 1-1/2 tablets in the evening and lunch ordered.  This continues to be managed by his PCP.  Obesity with a BMI of 30 with a noted 21 pound decrease in his weight since October.  He has been congratulated on his weight loss and encouraged to continue on his weight loss journey.       Dispo: Patient return to clinic to see MD/APP in 3 to 4 weeks or sooner for reevaluation of symptoms  Signed, Oshua Mcconaha, NP

## 2023-08-14 NOTE — Patient Instructions (Signed)
 Medication Instructions:  Your physician has recommended you make the following change in your medication:   DECREASE Amlodipine  to 5 mg once daily   *If you need a refill on your cardiac medications before your next appointment, please call your pharmacy*   Lab Work: CBC & BMET   If you have labs (blood work) drawn today and your tests are completely normal, you will receive your results only by: MyChart Message (if you have MyChart) OR A paper copy in the mail If you have any lab test that is abnormal or we need to change your treatment, we will call you to review the results.   Testing/Procedures: None   Follow-Up: At [facility], you and your health needs are our priority.  As part of our continuing mission to provide you with exceptional heart care, we have created designated Provider Care Teams.  These Care Teams include your primary Cardiologist (physician) and Advanced Practice Providers (APPs -  Physician Assistants and Nurse Practitioners) who all work together to provide you with the care you need, when you need it.   Your next appointment:   3-4 week(s)  Provider:   Tylene Lunch, NP

## 2023-08-15 ENCOUNTER — Other Ambulatory Visit: Payer: Self-pay

## 2023-08-15 DIAGNOSIS — Z79899 Other long term (current) drug therapy: Secondary | ICD-10-CM

## 2023-08-15 LAB — BASIC METABOLIC PANEL
BUN/Creatinine Ratio: 13 (ref 10–24)
BUN: 22 mg/dL (ref 8–27)
CO2: 21 mmol/L (ref 20–29)
Calcium: 9.7 mg/dL (ref 8.6–10.2)
Chloride: 102 mmol/L (ref 96–106)
Creatinine, Ser: 1.66 mg/dL — ABNORMAL HIGH (ref 0.76–1.27)
Glucose: 100 mg/dL — ABNORMAL HIGH (ref 70–99)
Potassium: 3.7 mmol/L (ref 3.5–5.2)
Sodium: 143 mmol/L (ref 134–144)
eGFR: 45 mL/min/{1.73_m2} — ABNORMAL LOW (ref >59)

## 2023-08-15 LAB — CBC
Hematocrit: 39.9 % (ref 37.5–51.0)
Hemoglobin: 13.9 g/dL (ref 13.0–17.7)
MCH: 32.5 pg (ref 26.6–33.0)
MCHC: 34.8 g/dL (ref 31.5–35.7)
MCV: 93 fL (ref 79–97)
Platelets: 197 10*3/uL (ref 150–450)
RBC: 4.28 x10E6/uL (ref 4.14–5.80)
RDW: 13.2 % (ref 11.6–15.4)
WBC: 8.1 10*3/uL (ref 3.4–10.8)

## 2023-08-15 NOTE — Progress Notes (Signed)
 Kidney function shows decreasing function.  Continue to avoid NSAIDs.  Continue with medication changes made yesterday to allow for increase in blood pressure.  Recheck BMET in 1 week.

## 2023-08-17 ENCOUNTER — Other Ambulatory Visit: Payer: Self-pay | Admitting: Cardiology

## 2023-08-18 ENCOUNTER — Other Ambulatory Visit: Payer: Self-pay | Admitting: Internal Medicine

## 2023-09-06 ENCOUNTER — Other Ambulatory Visit: Payer: Self-pay | Admitting: Surgery

## 2023-09-06 DIAGNOSIS — K458 Other specified abdominal hernia without obstruction or gangrene: Secondary | ICD-10-CM

## 2023-09-11 ENCOUNTER — Ambulatory Visit: Payer: Managed Care, Other (non HMO) | Attending: Cardiology | Admitting: Cardiology

## 2023-09-11 ENCOUNTER — Encounter: Payer: Self-pay | Admitting: Cardiology

## 2023-09-11 VITALS — BP 130/82 | HR 95 | Ht 69.0 in | Wt 209.2 lb

## 2023-09-11 DIAGNOSIS — E118 Type 2 diabetes mellitus with unspecified complications: Secondary | ICD-10-CM

## 2023-09-11 DIAGNOSIS — I480 Paroxysmal atrial fibrillation: Secondary | ICD-10-CM

## 2023-09-11 DIAGNOSIS — I10 Essential (primary) hypertension: Secondary | ICD-10-CM

## 2023-09-11 DIAGNOSIS — G4733 Obstructive sleep apnea (adult) (pediatric): Secondary | ICD-10-CM | POA: Diagnosis not present

## 2023-09-11 DIAGNOSIS — R0602 Shortness of breath: Secondary | ICD-10-CM

## 2023-09-11 DIAGNOSIS — I2510 Atherosclerotic heart disease of native coronary artery without angina pectoris: Secondary | ICD-10-CM

## 2023-09-11 DIAGNOSIS — E782 Mixed hyperlipidemia: Secondary | ICD-10-CM

## 2023-09-11 DIAGNOSIS — Z01812 Encounter for preprocedural laboratory examination: Secondary | ICD-10-CM | POA: Diagnosis not present

## 2023-09-11 DIAGNOSIS — E669 Obesity, unspecified: Secondary | ICD-10-CM

## 2023-09-11 NOTE — Progress Notes (Signed)
 Cardiology Office Note:  .   Date:  09/11/2023  ID:  Jesse Vargas, DOB [birth year], MRN [id number] PCP: [name], MD  [facility] Providers Cardiologist:  Dietrich Pates, MD    History of Present Illness: .   Jesse Vargas is a 68 y.o. male with a past medical history of coronary artery disease with last heart catheterization (2007), primary retention, SVT status post ablation for AVNRT (2015), hyperlipidemia, OSA, paroxysmal atrial fibrillation, type 2 diabetes, obesity, who presents today for follow-up of his coronary artery disease and paroxysmal atrial fibrillation.   Previous Myoview in 2012 was considered normal.  He was evaluated for chest pain in 2015 which led to [facility] which revealed a 50-60% stenosis to the LAD.  Echocardiogram revealed LVEF with normal wall motion G2 DD.  03/2022 he had a ZIO monitor which showed atrial fibrillation with a 3% burden at that time.   He was last seen in clinic 01/25/2019 for complaints of worsening peripheral edema, palpitations, fatigue, intermittent atrial fibrillation more frequently not been able to sleep well.  No medication changes that were made and further testing that was ordered.  With his increased palpitations had discussed with patient repeating ZIO monitor which she had preferred to follow-up with pulmonary prior to.   He was last seen in clinic 2//25 stating overall he been doing well.  He been months when her left upper months has been normal until 9.  He had been having dizziness primarily when bending over and sitting back up.  His Apple Watch alerted him he was back in atrial fibrillation with varying heart rates.  With continued orthostasis his amlodipine was dropped to 5 mg daily his carvedilol had already been decreased back from 37 and half milligrams to 25 mg twice daily.  He was sent for follow-up CBC and BMP.  Also discussed and he may benefit from cardioversion if he continues to remain in atrial flutter.  He returns to  clinic today with continued fatigue, shortness of breath, weight gain.  He continues to have left-sided rib pain where he states that he is having a CT scan completed in [place] at [facility] on Friday. Concerns of a hernia.  He denies any chest pain.  Continues to have palpitations and is noted his heart rates of 130s and 140s with his watch alerting him that he is in atrial fibrillation.  Has been compliant with his current medication regimen.  States that he has not missed any doses of apixaban.  Denies any issues with bleeding as he has not noted any blood in his stool or urine.  Denies any hospitalizations or visits to the emergency department.  ROS: 10 point review of system has been reviewed and considered negative except what is been listed in the HPI  Studies Reviewed: Marland Kitchen   EKG Interpretation Date/Time:  Tuesday September 11 2023 15:33:33 EST Ventricular Rate:  95 PR Interval:    QRS Duration:  92 QT Interval:  372 QTC Calculation: 467 R Axis:   80  Text Interpretation: Atrial fibrillation When compared with ECG of 14-Aug-2023 15:42, Confirmed by Charlsie Quest ([id number]) on 09/11/2023 4:30:54 PM    TTE 01/12/22 1. Left ventricular ejection fraction, by estimation, is 60 to 65%. The  left ventricle has normal function. The left ventricle has no regional  wall motion abnormalities. There is mild concentric left ventricular  hypertrophy. Left ventricular diastolic  parameters are consistent with Grade II diastolic dysfunction  (pseudonormalization). The average left ventricular global  longitudinal  strain is -26.7 %. The global longitudinal strain is normal.   2. Right ventricular systolic function is normal. The right ventricular  size is mildly enlarged. There is normal pulmonary artery systolic  pressure.   3. Left atrial size was moderately dilated.   4. Right atrial size was mild to moderately dilated.   5. The mitral valve is normal in structure. Trivial mitral valve  regurgitation. No  evidence of mitral stenosis.   6. The aortic valve is tricuspid. There is mild calcification of the  aortic valve. Aortic valve regurgitation is not visualized. Aortic valve  sclerosis is present, with no evidence of aortic valve stenosis.   7. The inferior vena cava is normal in size with greater than 50%  respiratory variability, suggesting right atrial pressure of 3 mmHg. Risk Assessment/Calculations:    CHA2DS2-VASc Score = 4   This indicates a 4.8% annual risk of stroke. The patient's score is based upon: CHF History: 0 HTN History: 1 Diabetes History: 1 Stroke History: 0 Vascular Disease History: 1 Age Score: 1 Gender Score: 0            Physical Exam:   VS:  BP 130/82   Pulse 95   Ht 5\' 9"  (1.753 m)   Wt 209 lb 3.2 oz (94.9 kg)   SpO2 96%   BMI 30.89 kg/m    Wt Readings from Last 3 Encounters:  09/11/23 209 lb 3.2 oz (94.9 kg)  08/14/23 203 lb 3.2 oz (92.2 kg)  05/02/23 222 lb 9.6 oz (101 kg)    GEN: Well nourished, well developed in no acute distress NECK: No JVD; No carotid bruits CARDIAC: IR IR, II/VI systolic RUSB murmur without rubs or gallops RESPIRATORY:  Clear to auscultation without rales, wheezing or rhonchi  ABDOMEN: Soft, non-tender, non-distended EXTREMITIES:  No edema; No deformity   ASSESSMENT AND PLAN: .    Paroxysmal atrial fibrillation/flutter with atrial fibrillation and noted on EKG today that is rate controlled.  Patient continues to have weight gain and fatigue.  He has been scheduled for an updated echocardiogram.  He has not missed any doses of his apixaban and denies any bleeding with no blood noted in his urine or stool.  He is continued on apixaban 5 mg twice daily for CHA2DS2-VASc score of at least 4 for stroke prophylaxis.  He is also being scheduled for an elective cardioversion procedure.  He was noted to be in atrial fibrillation in October on EKG.  With him being in continuous atrial fibrillation atrial flutter since then and having  insurance constraints with Multaq Multaq is being stopped today.  He has also been advised that he will have to hold his Bongiorno for a week and his metformin 24 hours before the procedure.  He has been sent for CBC, BMP, and mag level today.  Coronary disease native coronary arteries without angina.  EKG today reveals atrial fibrillation.  He is continued on apixaban and lieu of aspirin and atorvastatin 40 mg daily.  Hyperlipidemia with last LDL of 76.  He is continued on atorvastatin 40 mg daily for department and 60 mg daily.  Will need updated lipid panel on return.  Primary hypertension with blood pressure today 130/82.  He is continued on amlodipine 5 mg daily, carvedilol 25 mg twice daily and olmesartan HCTZ 40/25 mg daily.  Encouraged to monitor pressures 1 to 2 hours postmedication administration at home as well.  Obstructive sleep apnea where he states he has been compliant  with CPAP therapy.  Obesity with a BMI of 30.8.  He has noted a 6 pound weight gain since his last visit.  He has been encouraged to continue on his weight loss journey.  Type 2 diabetes where he is continued on metformin 1000 mg 1 tablet in the morning 1/2 tablets in the evening in a month troponin.  Ongoing management per PCP.    Informed Consent   Shared Decision Making/Informed Consent The risks (stroke, cardiac arrhythmias rarely resulting in the need for a temporary or permanent pacemaker, skin irritation or burns and complications associated with conscious sedation including aspiration, arrhythmia, respiratory failure and death), benefits (restoration of normal sinus rhythm) and alternatives of a direct current cardioversion were explained in detail to Mr. Vasco and he agrees to proceed.       Dispo: Patient to return to clinic to see MD/APP 2 to 3 weeks postprocedure or sooner if needed  Signed, Lannette Avellino, NP

## 2023-09-11 NOTE — Patient Instructions (Addendum)
 Medication Instructions:  - STOP MULTAQ 400MG      *If you need a refill on your cardiac medications before your next appointment, please call your pharmacy*   Lab Work: Complete Blood Count  Basic Metabolic Panel  Magnesium    If you have labs (blood work) drawn today and your tests are completely normal, you will receive your results only by: MyChart Message (if you have MyChart) OR A paper copy in the mail If you have any lab test that is abnormal or we need to change your treatment, we will call you to review the results.   Testing/Procedures:   Echo will be scheduled at [address].  Your physician has requested that you have an echocardiogram. Echocardiography is a painless test that uses sound waves to create images of your heart. It provides your doctor with information about the size and shape of your heart and how well your heart's chambers and valves are working. This procedure takes approximately one hour. There are no restrictions for this procedure. Please do NOT wear cologne, perfume, aftershave, or lotions (deodorant is allowed). Please arrive 15 minutes prior to your appointment time.       Dear Jesse Vargas  You are scheduled for a Cardioversion on TUESDAY, March 18 with Dr. Mariah Milling.  Please arrive at the [facility] Entrance of [facility], [address], [place] [address] at 6:30 AM (This is 1 hour(s) prior to your procedure time).  Proceed to the Check-In Desk directly inside the entrance.  Procedure Parking: Use the entrance off of the [address] side of the hospital. Turn right upon entering and follow the driveway to parking that is directly in front of the [facility]. There is no valet parking available at this entrance, however there is an awning directly in front of the [facility] Vascular Center for drop off/ pick up for patients.    DIET:  Nothing to eat or drink after midnight except a sip of water with  medications (see medication instructions below)  MEDICATION INSTRUCTIONS: !!IF ANY NEW MEDICATIONS ARE STARTED AFTER TODAY, PLEASE NOTIFY YOUR PROVIDER AS SOON AS POSSIBLE!!  FYI: Medications such as Semaglutide (Ozempic, [place]), Tirzepatide (Mounjaro, Zepbound), Dulaglutide (Trulicity), etc ("GLP1 agonists") AND Canagliflozin (Invokana), Dapagliflozin (Farxiga), Empagliflozin (Jardiance), Ertugliflozin (Steglatro), Bexagliflozin [employer]) or any combination with one of these drugs such as Invokamet (Canagliflozin/Metformin), Synjardy (Empagliflozin/Metformin), etc ("SGLT2 inhibitors") must be held around the time of a procedure. This is not a comprehensive list of all of these drugs. Please review all of your medications and talk to your provider if you take any one of these. If you are not sure, ask your provider.    HOLD: Tirzepatide (Mounjaro, Zepbound) for 7 days prior to the procedure. Last dose on Sunday, March 09.  HOLD: Metformin (GLUCOPHAGE) 1000mg  for 24 hours prior to procedure. Last dose on 09/23/2023.    Continue taking your anticoagulant (blood thinner): Apixaban (Eliquis).  You will need to continue this after your procedure until you are told by your provider that it is safe to stop.    LABS:   LABS WILL BE DRAWN TODAY 09/11/2023.   FYI:  For your safety, and to allow [place] to monitor your vital signs accurately during the surgery/procedure we request: If you have artificial nails, gel coating, SNS etc, please have those removed prior to your surgery/procedure. Not having the nail coverings /polish removed may result in cancellation or delay of your  surgery/procedure.  Your support person will be asked to wait in the waiting room during your procedure.  It is OK to have someone drop you off and come back when you are ready to be discharged.  You cannot drive after the procedure and will need someone to drive you home.  Bring your insurance cards.  *Special Note: Every effort is  made to have your procedure done on time. Occasionally there are emergencies that occur at the hospital that may cause delays. Please be patient if a delay does occur.       Follow-Up: At [facility], you and your health needs are our priority.  As part of our continuing mission to provide you with exceptional heart care, we have created designated Provider Care Teams.  These Care Teams include your primary Cardiologist (physician) and Advanced Practice Providers (APPs -  Physician Assistants and Nurse Practitioners) who all work together to provide you with the care you need, when you need it.  We recommend signing up for the patient portal called "MyChart".  Sign up information is provided on this After Visit Summary.  MyChart is used to connect with patients for Virtual Visits (Telemedicine).  Patients are able to view lab/test results, encounter notes, upcoming appointments, etc.  Non-urgent messages can be sent to your provider as well.   To learn more about what you can do with MyChart, go to [email or web address].    Your next appointment:   4 week(s)  The format for your next appointment:   In Person  Provider:   You will see one of the following Advanced Practice Providers on your designated Care Team:   Nicolasa Ducking, NP Eula Listen, PA-C Cadence Fransico Michael, PA-C Charlsie Quest, NP Carlos Levering, NP     Other Instructions

## 2023-09-12 LAB — MAGNESIUM: Magnesium: 1.4 mg/dL — ABNORMAL LOW (ref 1.6–2.3)

## 2023-09-12 LAB — BASIC METABOLIC PANEL
BUN/Creatinine Ratio: 12 (ref 10–24)
BUN: 15 mg/dL (ref 8–27)
CO2: 23 mmol/L (ref 20–29)
Calcium: 9.8 mg/dL (ref 8.6–10.2)
Chloride: 99 mmol/L (ref 96–106)
Creatinine, Ser: 1.22 mg/dL (ref 0.76–1.27)
Glucose: 78 mg/dL (ref 70–99)
Potassium: 4.1 mmol/L (ref 3.5–5.2)
Sodium: 141 mmol/L (ref 134–144)
eGFR: 66 mL/min/{1.73_m2} (ref >59)

## 2023-09-12 LAB — CBC
Hematocrit: 39.1 % (ref 37.5–51.0)
Hemoglobin: 13.4 g/dL (ref 13.0–17.7)
MCH: 32.1 pg (ref 26.6–33.0)
MCHC: 34.3 g/dL (ref 31.5–35.7)
MCV: 94 fL (ref 79–97)
Platelets: 185 10*3/uL (ref 150–450)
RBC: 4.18 x10E6/uL (ref 4.14–5.80)
RDW: 13.2 % (ref 11.6–15.4)
WBC: 7.3 10*3/uL (ref 3.4–10.8)

## 2023-09-12 NOTE — Progress Notes (Signed)
 Kidney function has improved.  Hemoglobin is stable.  No indication of infection with a normal white blood cell count.  Potassium is stable.  Magnesium is low.  Recommend starting magnesium oxide 200 mg twice daily for 1 week and then 200 mg daily thereafter will repeat magnesium level in 1 week.

## 2023-09-13 ENCOUNTER — Other Ambulatory Visit: Payer: Self-pay | Admitting: *Deleted

## 2023-09-13 DIAGNOSIS — R790 Abnormal level of blood mineral: Secondary | ICD-10-CM

## 2023-09-13 MED ORDER — MAGNESIUM 200 MG PO TABS: ORAL_TABLET | ORAL | 1 refills | Status: AC

## 2023-09-13 NOTE — Telephone Encounter (Signed)
 He wants to cancel his Cardioversion? That is totally up to him, that is a lot of money to pay out of pocket.

## 2023-09-21 NOTE — Telephone Encounter (Signed)
 Called and spoke with patient. Patient states that he canceled his cardioversion due to insurance. Patient states that he is currently changing insurance and once he has the new insurance he will call to reschedule his cardioversion.

## 2023-09-25 ENCOUNTER — Ambulatory Visit (HOSPITAL_COMMUNITY): Admit: 2023-09-25 | Admitting: Cardiovascular Disease

## 2023-09-25 SURGERY — CARDIOVERSION
Anesthesia: Monitor Anesthesia Care

## 2023-10-04 ENCOUNTER — Other Ambulatory Visit: Payer: Self-pay

## 2023-10-04 DIAGNOSIS — R790 Abnormal level of blood mineral: Secondary | ICD-10-CM

## 2023-10-05 ENCOUNTER — Ambulatory Visit

## 2023-10-05 LAB — MAGNESIUM: Magnesium: 1.6 mg/dL (ref 1.6–2.3)

## 2023-10-05 NOTE — Addendum Note (Signed)
 Addended by: Sandi Mariscal on: 10/05/2023 12:47 PM   Modules accepted: Orders

## 2023-10-05 NOTE — Telephone Encounter (Signed)
 It is reasonable to increase is Magnesium oxide from 200 mg daily back to 200 mg twice daily and recheck a level in 2 weeks.

## 2023-10-05 NOTE — Progress Notes (Signed)
 Magnesium remains slightly low. Continue Magnesium Oxide 200 mg twice daily, which is an increase from 200 mg daily. Repeat Mg level in 2 weeks.

## 2023-10-08 ENCOUNTER — Other Ambulatory Visit (HOSPITAL_COMMUNITY): Payer: Self-pay

## 2023-10-08 ENCOUNTER — Telehealth: Payer: Self-pay | Admitting: Pharmacy Technician

## 2023-10-08 ENCOUNTER — Inpatient Hospital Stay
Admission: RE | Admit: 2023-10-08 | Discharge: 2023-10-08 | Disposition: A | Discharge status: Home / Self Care | Payer: Self-pay | Source: Ambulatory Visit | Attending: Surgery | Admitting: Surgery

## 2023-10-08 ENCOUNTER — Other Ambulatory Visit: Payer: Self-pay | Admitting: Surgery

## 2023-10-08 DIAGNOSIS — R109 Unspecified abdominal pain: Secondary | ICD-10-CM

## 2023-10-08 NOTE — Telephone Encounter (Signed)
 Pharmacy Patient Advocate Encounter   Received notification from Onbase that prior authorization for icosapent is required/requested.   Insurance verification completed.   The patient is insured through [facility] .   Per test claim: PA required; PA submitted to above mentioned insurance via CoverMyMeds Key/confirmation #/EOC [facility] Status is pending

## 2023-10-09 NOTE — Telephone Encounter (Signed)
 Pharmacy Patient Advocate Encounter  Received notification from [facility] that Prior Authorization for icosapent has been APPROVED from 10/08/23 to 10/07/24. Spoke to pharmacy to process.Copay is $20.00.    PA #/Case ID/Reference #: [id number]

## 2023-10-12 ENCOUNTER — Ambulatory Visit: Admitting: Cardiology

## 2023-10-28 ENCOUNTER — Other Ambulatory Visit: Payer: Self-pay | Admitting: Internal Medicine

## 2023-10-29 NOTE — Telephone Encounter (Signed)
 Prescription refill request for Eliquis  received. Indication:afib Last office visit:3/25 Scr:1.22  3/25 Age: 68 Weight:94.9  kg  Prescription refilled

## 2023-11-06 ENCOUNTER — Other Ambulatory Visit

## 2023-11-08 ENCOUNTER — Ambulatory Visit: Admitting: Cardiology

## 2023-11-19 ENCOUNTER — Telehealth: Payer: Self-pay | Admitting: Cardiology

## 2023-11-19 ENCOUNTER — Other Ambulatory Visit: Payer: Self-pay | Admitting: Emergency Medicine

## 2023-11-19 DIAGNOSIS — R0602 Shortness of breath: Secondary | ICD-10-CM

## 2023-11-19 NOTE — Telephone Encounter (Signed)
 Pt had an echo tomorrow that was cancelled due to there being no order. PCP office called in stating they received a call from [facility] office that they were cancelling because they never received order from Dr. Dean Every, however Dr. Dean Every never requested an echo be done. Please advise if the order was supposed to come from [place], NP instead.

## 2023-11-20 ENCOUNTER — Ambulatory Visit

## 2023-11-28 ENCOUNTER — Other Ambulatory Visit: Payer: Self-pay

## 2023-11-28 ENCOUNTER — Ambulatory Visit: Attending: Cardiology

## 2023-11-28 DIAGNOSIS — R0602 Shortness of breath: Secondary | ICD-10-CM | POA: Diagnosis not present

## 2023-11-28 DIAGNOSIS — R790 Abnormal level of blood mineral: Secondary | ICD-10-CM

## 2023-11-28 LAB — ECHOCARDIOGRAM COMPLETE
AR max vel: 3.52 cm2
AV Area VTI: 3.6 cm2
AV Area mean vel: 3.43 cm2
AV Mean grad: 4 mmHg
AV Peak grad: 7.3 mmHg
Ao pk vel: 1.35 m/s
Area-P 1/2: 3.99 cm2
Calc EF: 52.3 %
S' Lateral: 2.4 cm
Single Plane A2C EF: 47.5 %
Single Plane A4C EF: 58 %

## 2023-11-29 LAB — MAGNESIUM: Magnesium: 1.8 mg/dL (ref 1.6–2.3)

## 2023-12-04 ENCOUNTER — Ambulatory Visit: Payer: Self-pay | Admitting: Cardiology

## 2023-12-04 ENCOUNTER — Ambulatory Visit: Admitting: Cardiology

## 2023-12-04 NOTE — Progress Notes (Signed)
 Heart squeeze to be 60 to 65%, with normal function, there were no regional wall motion abnormalities that were noted.  There is mild to moderate leakage noted in the mitral valve, which is slightly increased over the last several years.  There is thickening of the aortic valve that is unchanged. Overall reassuring study.

## 2023-12-04 NOTE — Progress Notes (Signed)
 Continue with current medication regimen without changes at this time.  Magnesium  is slowly improving.

## 2023-12-06 DIAGNOSIS — J029 Acute pharyngitis, unspecified: Secondary | ICD-10-CM | POA: Diagnosis not present

## 2023-12-06 DIAGNOSIS — U071 COVID-19: Secondary | ICD-10-CM | POA: Diagnosis not present

## 2023-12-06 DIAGNOSIS — Z03818 Encounter for observation for suspected exposure to other biological agents ruled out: Secondary | ICD-10-CM | POA: Diagnosis not present

## 2023-12-18 ENCOUNTER — Encounter: Payer: Self-pay | Admitting: Cardiology

## 2023-12-18 ENCOUNTER — Ambulatory Visit: Attending: Cardiology | Admitting: Cardiology

## 2023-12-18 VITALS — BP 114/76 | HR 115 | Resp 18 | Ht 69.0 in | Wt 202.4 lb

## 2023-12-18 DIAGNOSIS — G4733 Obstructive sleep apnea (adult) (pediatric): Secondary | ICD-10-CM | POA: Diagnosis not present

## 2023-12-18 DIAGNOSIS — E118 Type 2 diabetes mellitus with unspecified complications: Secondary | ICD-10-CM

## 2023-12-18 DIAGNOSIS — Z79899 Other long term (current) drug therapy: Secondary | ICD-10-CM | POA: Diagnosis not present

## 2023-12-18 DIAGNOSIS — I2510 Atherosclerotic heart disease of native coronary artery without angina pectoris: Secondary | ICD-10-CM

## 2023-12-18 DIAGNOSIS — I10 Essential (primary) hypertension: Secondary | ICD-10-CM | POA: Diagnosis not present

## 2023-12-18 DIAGNOSIS — I480 Paroxysmal atrial fibrillation: Secondary | ICD-10-CM

## 2023-12-18 DIAGNOSIS — E782 Mixed hyperlipidemia: Secondary | ICD-10-CM | POA: Diagnosis not present

## 2023-12-18 NOTE — Patient Instructions (Signed)
 Medication Instructions:  Your physician recommends that you continue on your current medications as directed. Please refer to the Current Medication list given to you today.   *If you need a refill on your cardiac medications before your next appointment, please call your pharmacy*  Lab Work: Your provider would like for you to have following labs drawn Friday June 13 CBC, BMP, Direct LDL.   If you have labs (blood work) drawn today and your tests are completely normal, you will receive your results only by: MyChart Message (if you have MyChart) OR A paper copy in the mail If you have any lab test that is abnormal or we need to change your treatment, we will call you to review the results.  Testing/Procedures:     Dear Jesse Vargas  You are scheduled for a Cardioversion on Friday, June 20 with Dr. Gollan.  Please arrive at the [facility] Entrance of [facility], [address], [place] [address] at 11:00 AM (This is 1 hour(s) prior to your procedure time).  Proceed to the Check-In Desk directly inside the entrance.  Procedure Parking: Use the entrance off of the [address] side of the hospital. Turn right upon entering and follow the driveway to parking that is directly in front of the [facility]. There is no valet parking available at this entrance, however there is an awning directly in front of the [facility] Center for drop off/ pick up for patients.   DIET:  Nothing to eat or drink after midnight except a sip of water with medications (see medication instructions below)  MEDICATION INSTRUCTIONS: !!IF ANY NEW MEDICATIONS ARE STARTED AFTER TODAY, PLEASE NOTIFY YOUR PROVIDER AS SOON AS POSSIBLE!!  FYI: Medications such as Semaglutide (Ozempic, [place]), Tirzepatide  (Mounjaro , Zepbound), Dulaglutide (Trulicity), etc ("GLP1 agonists") AND Canagliflozin (Invokana), Dapagliflozin (Farxiga), Empagliflozin (Jardiance), Ertugliflozin (Steglatro),  Bexagliflozin [employer]) or any combination with one of these drugs such as Invokamet (Canagliflozin/Metformin ), Synjardy (Empagliflozin/Metformin ), etc ("SGLT2 inhibitors") must be held around the time of a procedure. This is not a comprehensive list of all of these drugs. Please review all of your medications and talk to your provider if you take any one of these. If you are not sure, ask your provider.  HOLD: Tirzepatide  (Mounjaro , Zepbound) for 7 days prior to the procedure. Last dose on Friday, June 13.  Continue taking your anticoagulant (blood thinner): Apixaban  (Eliquis ).  You will need to continue this after your procedure until you are told by your provider that it is safe to stop.    LABS:   Come to [facility] on Friday June 13 for labs  FYI:  For your safety, and to allow us  to monitor your vital signs accurately during the surgery/procedure we request: If you have artificial nails, gel coating, SNS etc, please have those removed prior to your surgery/procedure. Not having the nail coverings /polish removed may result in cancellation or delay of your surgery/procedure.  Your support person will be asked to wait in the waiting room during your procedure.  It is OK to have someone drop you off and come back when you are ready to be discharged.  You cannot drive after the procedure and will need someone to drive you home.  Bring your insurance cards.  *Special Note: Every effort is made to have your procedure done on time. Occasionally there are emergencies that occur at the hospital that may cause delays. Please be patient if a delay does occur.  Follow-Up: At [facility], you and your health needs are our priority.  As part of our continuing mission to provide you with exceptional heart care, our providers are all part of one team.  This team includes your primary Cardiologist (physician) and Advanced Practice Providers or APPs (Physician Assistants and Nurse  Practitioners) who all work together to provide you with the care you need, when you need it.  Your next appointment:   2 week(s) post procedure  Provider:   Ronald Cockayne, NP

## 2023-12-18 NOTE — H&P (View-Only) (Signed)
 Cardiology Office Note   Date:  12/18/2023  ID:  Jesse Vargas, DOB Feb 08, 1956, MRN [id number] PCP: Jesse San, MD  [facility] Providers Cardiologist:  Jesse Berger, MD     History of Present Illness Jesse Vargas is a 68 y.o. male with a past medical history of coronary artery disease with last heart catheterization (2007), primary hypertension, SVT status post ablation for AVNRT (2015), hyperlipidemia, OSA, paroxysmal atrial fibrillation, type 2 diabetes, obesity, who presents today for follow-up of his coronary artery disease and paroxysmal atrial fibrillation.   Previous Myoview  in 2012 was considered normal.  He was evaluated for chest pain in 2015 which led to [facility] which revealed a 50-60% stenosis to the LAD.  Echocardiogram revealed LVEF with normal wall motion G2 DD.  03/2022 he had a ZIO monitor which showed atrial fibrillation with a 3% burden at that time.   He was last seen in clinic 01/25/2019 for complaints of worsening peripheral edema, palpitations, fatigue, intermittent atrial fibrillation more frequently not been able to sleep well.  No medication changes that were made and further testing that was ordered.  With his increased palpitations had discussed with patient repeating ZIO monitor which she had preferred to follow-up with pulmonary prior to.   He was last seen in clinic 3//25 with continued complaints of fatigue, shortness of breath, weight gain and left-sided rib pain.  He was having a CT scan completed at [facility] center as he on Friday.  He had concerns for hernia.  Denied any chest discomfort.  He was scheduled for an updated echocardiogram.  It with his symptoms was scheduled for a cardioversion procedure which unfortunately he had to cancel due to cost.  He returns to clinic today with continued symptoms of fatigue, occasional dizziness, DOE.  Recently was diagnosed with COVID infection and was treated with doxycycline, Promethazine  DM, and steroids.  Continues to have elevated heart rates and is remained in atrial fibrillation.  He is continued on apixaban  with no missed doses and denies any bleeding with no blood noted in his urine or stool.  He has been continued on his current medication without undue side effects.  Denies any hospitalizations or ER visits.   ROS: 10 point review of system has been reviewed and considered negative exception was been listed in the HPI  Studies Reviewed EKG Interpretation Date/Time:  Tuesday December 18 2023 16:32:57 EDT Ventricular Rate:  98 PR Interval:    QRS Duration:  90 QT Interval:  356 QTC Calculation: 454 R Axis:   52  Text Interpretation: Atrial fibrillation Nonspecific T wave abnormality When compared with ECG of 11-Sep-2023 15:33, No significant change was found Confirmed by Jesse Vargas ([id number]) on 12/18/2023 4:36:11 PM    2D echo 11/28/2023 1. Left ventricular ejection fraction, by estimation, is 60 to 65%. The  left ventricle has normal function. The left ventricle has no regional  wall motion abnormalities. Left ventricular diastolic parameters are  indeterminate.   2. Right ventricular systolic function is normal. The right ventricular  size is normal. There is normal pulmonary artery systolic pressure. The  estimated right ventricular systolic pressure is 24.4 mmHg.   3. The mitral valve is normal in structure. Mild to moderate mitral valve  regurgitation. No evidence of mitral stenosis.   4. The aortic valve is normal in structure. Aortic valve regurgitation is  not visualized. Aortic valve sclerosis is present, with no evidence of  aortic valve stenosis.   5. The  inferior vena cava is normal in size with greater than 50%  respiratory variability, suggesting right atrial pressure of 3 mmHg.   6. Rhythm suggestive of atrial fibrillation   TTE 01/12/22 1. Left ventricular ejection fraction, by estimation, is 60 to 65%. The  left ventricle has normal function. The  left ventricle has no regional  wall motion abnormalities. There is mild concentric left ventricular  hypertrophy. Left ventricular diastolic  parameters are consistent with Grade II diastolic dysfunction  (pseudonormalization). The average left ventricular global longitudinal  strain is -26.7 %. The global longitudinal strain is normal.   2. Right ventricular systolic function is normal. The right ventricular  size is mildly enlarged. There is normal pulmonary artery systolic  pressure.   3. Left atrial size was moderately dilated.   4. Right atrial size was mild to moderately dilated.   5. The mitral valve is normal in structure. Trivial mitral valve  regurgitation. No evidence of mitral stenosis.   6. The aortic valve is tricuspid. There is mild calcification of the  aortic valve. Aortic valve regurgitation is not visualized. Aortic valve  sclerosis is present, with no evidence of aortic valve stenosis.   7. The inferior vena cava is normal in size with greater than 50%  respiratory variability, suggesting right atrial pressure of 3 mmHg. Risk Assessment/Calculations  CHA2DS2-VASc Score = 4   This indicates a 4.8% annual risk of stroke. The patient's score is based upon: CHF History: 0 HTN History: 1 Diabetes History: 1 Stroke History: 0 Vascular Disease History: 1 Age Score: 1 Gender Score: 0            Physical Exam VS:  BP 114/76 (BP Location: Left Arm, Patient Position: Sitting, Cuff Size: Normal)   Pulse (!) 115   Resp 18   Ht 5\' 9"  (1.753 m)   Wt 202 lb 6.4 oz (91.8 kg)   BMI 29.89 kg/m    Wt Readings from Last 3 Encounters:  12/18/23 202 lb 6.4 oz (91.8 kg)  09/11/23 209 lb 3.2 oz (94.9 kg)  08/14/23 203 lb 3.2 oz (92.2 kg)    GEN: Well nourished, well developed in no acute distress NECK: No JVD; No carotid bruits CARDIAC: IR IR, II/VI systolic murmur RUSB , without rubs or gallops RESPIRATORY:  Clear to auscultation without rales, wheezing or rhonchi   ABDOMEN: Soft, non-tender, non-distended EXTREMITIES:  No edema; No deformity   ASSESSMENT AND PLAN Paroxysmal atrial fibrillation where he remains in atrial fibrillation on exam today.  With his continuing fatigue, dizziness, dyspnea on exertion with recent echocardiogram completed with LVEF of 60 to 65%, no RWMA, mild to moderate MR, aortic valve sclerosis without evidence of stenosis is present.  He has been continued on apixaban  5 mg twice daily for CHA2DS2-VASc score of at least 4 for stroke prophylaxis.  He is also continued on carvedilol  37 g twice daily for rate control.  Unfortunately continues to monitor his rate via his Apple Watch and his heart rate has been varying.  He has been rescheduled for cardioversion procedure in the next 2 to 3 weeks.  He has been sent for CBC and a BMP today.  EKG today revealed atrial fibrillation with a heart rate of 1 8022 with nonspecific ST and T wave abnormality with no acute change from prior studies.  Coronary artery disease of native coronary arteries without angina with no complaints of angina.  He has been continued on apixaban  and lieu of aspirin  and atorvastatin  40  mg daily.  No further ischemic workup at this time.  Mixed hyperlipidemia LDL of 76.  He has been continued on atorvastatin  40 fenofibrate  160 mg daily.  He is being sent for direct LDL today.  Primary hypertension with a blood pressure 114/76.  Patient has remained stable.  He is continued on amlodipine  5 mg daily, carvedilol  37.5 mg twice daily and HCTZ 40/25 mg daily.  He has been encouraged to continue to monitor his pressures 1 to 2 hours postmedication administration as well.  Obstructive sleep apnea where he has been compliant with CPAP therapy.  Type 2 diabetes where he is continued on metformin  1 tablet in the morning and 1 tablets in the evening as well as Mounjaro .  Prior to cardioversion procedure Bongiorno will have to be held for a week and metformin  placed on hold the  morning of medications will be resumed after procedure has been completed.  Ongoing management per his PCP.    Informed Consent   Shared Decision Making/Informed Consent The risks (stroke, cardiac arrhythmias rarely resulting in the need for a temporary or permanent pacemaker, skin irritation or burns and complications associated with conscious sedation including aspiration, arrhythmia, respiratory failure and death), benefits (restoration of normal sinus rhythm) and alternatives of a direct current cardioversion were explained in detail to Mr. Hewins and he agrees to proceed.       Dispo: Patient return to clinic to see MD/APP 2 to 3 weeks postprocedure with an EKG return or sooner if needed  Signed, Kellie Chisolm, NP

## 2023-12-18 NOTE — Progress Notes (Signed)
 Cardiology Office Note   Date:  12/18/2023  ID:  [name], DOB Feb 08, 1956, MRN [id number] PCP: Lyle San, MD  [facility] Providers Cardiologist:  Ola Berger, MD     History of Present Illness Jesse Vargas is a 68 y.o. male with a past medical history of coronary artery disease with last heart catheterization (2007), primary hypertension, SVT status post ablation for AVNRT (2015), hyperlipidemia, OSA, paroxysmal atrial fibrillation, type 2 diabetes, obesity, who presents today for follow-up of his coronary artery disease and paroxysmal atrial fibrillation.   Previous Myoview  in 2012 was considered normal.  He was evaluated for chest pain in 2015 which led to [facility] which revealed a 50-60% stenosis to the LAD.  Echocardiogram revealed LVEF with normal wall motion G2 DD.  03/2022 he had a ZIO monitor which showed atrial fibrillation with a 3% burden at that time.   He was last seen in clinic 01/25/2019 for complaints of worsening peripheral edema, palpitations, fatigue, intermittent atrial fibrillation more frequently not been able to sleep well.  No medication changes that were made and further testing that was ordered.  With his increased palpitations had discussed with patient repeating ZIO monitor which she had preferred to follow-up with pulmonary prior to.   He was last seen in clinic 3//25 with continued complaints of fatigue, shortness of breath, weight gain and left-sided rib pain.  He was having a CT scan completed at [facility] diagnostic imaging center as he on Friday.  He had concerns for hernia.  Denied any chest discomfort.  He was scheduled for an updated echocardiogram.  It with his symptoms was scheduled for a cardioversion procedure which unfortunately he had to cancel due to cost.  He returns to clinic today with continued symptoms of fatigue, occasional dizziness, DOE.  Recently was diagnosed with COVID infection and was treated with doxycycline, Promethazine  DM, and steroids.  Continues to have elevated heart rates and is remained in atrial fibrillation.  He is continued on apixaban  with no missed doses and denies any bleeding with no blood noted in his urine or stool.  He has been continued on his current medication without undue side effects.  Denies any hospitalizations or ER visits.   ROS: 10 point review of system has been reviewed and considered negative exception was been listed in the HPI  Studies Reviewed EKG Interpretation Date/Time:  Tuesday December 18 2023 16:32:57 EDT Ventricular Rate:  98 PR Interval:    QRS Duration:  90 QT Interval:  356 QTC Calculation: 454 R Axis:   52  Text Interpretation: Atrial fibrillation Nonspecific T wave abnormality When compared with ECG of 11-Sep-2023 15:33, No significant change was found Confirmed by Ronald Cockayne ([id number]) on 12/18/2023 4:36:11 PM    2D echo 11/28/2023 1. Left ventricular ejection fraction, by estimation, is 60 to 65%. The  left ventricle has normal function. The left ventricle has no regional  wall motion abnormalities. Left ventricular diastolic parameters are  indeterminate.   2. Right ventricular systolic function is normal. The right ventricular  size is normal. There is normal pulmonary artery systolic pressure. The  estimated right ventricular systolic pressure is 24.4 mmHg.   3. The mitral valve is normal in structure. Mild to moderate mitral valve  regurgitation. No evidence of mitral stenosis.   4. The aortic valve is normal in structure. Aortic valve regurgitation is  not visualized. Aortic valve sclerosis is present, with no evidence of  aortic valve stenosis.   5. The  inferior vena cava is normal in size with greater than 50%  respiratory variability, suggesting right atrial pressure of 3 mmHg.   6. Rhythm suggestive of atrial fibrillation   TTE 01/12/22 1. Left ventricular ejection fraction, by estimation, is 60 to 65%. The  left ventricle has normal function. The  left ventricle has no regional  wall motion abnormalities. There is mild concentric left ventricular  hypertrophy. Left ventricular diastolic  parameters are consistent with Grade II diastolic dysfunction  (pseudonormalization). The average left ventricular global longitudinal  strain is -26.7 %. The global longitudinal strain is normal.   2. Right ventricular systolic function is normal. The right ventricular  size is mildly enlarged. There is normal pulmonary artery systolic  pressure.   3. Left atrial size was moderately dilated.   4. Right atrial size was mild to moderately dilated.   5. The mitral valve is normal in structure. Trivial mitral valve  regurgitation. No evidence of mitral stenosis.   6. The aortic valve is tricuspid. There is mild calcification of the  aortic valve. Aortic valve regurgitation is not visualized. Aortic valve  sclerosis is present, with no evidence of aortic valve stenosis.   7. The inferior vena cava is normal in size with greater than 50%  respiratory variability, suggesting right atrial pressure of 3 mmHg. Risk Assessment/Calculations  CHA2DS2-VASc Score = 4   This indicates a 4.8% annual risk of stroke. The patient's score is based upon: CHF History: 0 HTN History: 1 Diabetes History: 1 Stroke History: 0 Vascular Disease History: 1 Age Score: 1 Gender Score: 0            Physical Exam VS:  BP 114/76 (BP Location: Left Arm, Patient Position: Sitting, Cuff Size: Normal)   Pulse (!) 115   Resp 18   Ht 5\' 9"  (1.753 m)   Wt 202 lb 6.4 oz (91.8 kg)   BMI 29.89 kg/m    Wt Readings from Last 3 Encounters:  12/18/23 202 lb 6.4 oz (91.8 kg)  09/11/23 209 lb 3.2 oz (94.9 kg)  08/14/23 203 lb 3.2 oz (92.2 kg)    GEN: Well nourished, well developed in no acute distress NECK: No JVD; No carotid bruits CARDIAC: IR IR, II/VI systolic murmur RUSB , without rubs or gallops RESPIRATORY:  Clear to auscultation without rales, wheezing or rhonchi   ABDOMEN: Soft, non-tender, non-distended EXTREMITIES:  No edema; No deformity   ASSESSMENT AND PLAN Paroxysmal atrial fibrillation where he remains in atrial fibrillation on exam today.  With his continuing fatigue, dizziness, dyspnea on exertion with recent echocardiogram completed with LVEF of 60 to 65%, no RWMA, mild to moderate MR, aortic valve sclerosis without evidence of stenosis is present.  He has been continued on apixaban  5 mg twice daily for CHA2DS2-VASc score of at least 4 for stroke prophylaxis.  He is also continued on carvedilol  37 g twice daily for rate control.  Unfortunately continues to monitor his rate via his Apple Watch and his heart rate has been varying.  He has been rescheduled for cardioversion procedure in the next 2 to 3 weeks.  He has been sent for CBC and a BMP today.  EKG today revealed atrial fibrillation with a heart rate of 1 8022 with nonspecific ST and T wave abnormality with no acute change from prior studies.  Coronary artery disease of native coronary arteries without angina with no complaints of angina.  He has been continued on apixaban  and lieu of aspirin  and atorvastatin  40  mg daily.  No further ischemic workup at this time.  Mixed hyperlipidemia LDL of 76.  He has been continued on atorvastatin  40 fenofibrate  160 mg daily.  He is being sent for direct LDL today.  Primary hypertension with a blood pressure 114/76.  Patient has remained stable.  He is continued on amlodipine  5 mg daily, carvedilol  37.5 mg twice daily and HCTZ 40/25 mg daily.  He has been encouraged to continue to monitor his pressures 1 to 2 hours postmedication administration as well.  Obstructive sleep apnea where he has been compliant with CPAP therapy.  Type 2 diabetes where he is continued on metformin  1 tablet in the morning and 1 tablets in the evening as well as Mounjaro .  Prior to cardioversion procedure Bongiorno will have to be held for a week and metformin  placed on hold the  morning of medications will be resumed after procedure has been completed.  Ongoing management per his PCP.    Informed Consent   Shared Decision Making/Informed Consent The risks (stroke, cardiac arrhythmias rarely resulting in the need for a temporary or permanent pacemaker, skin irritation or burns and complications associated with conscious sedation including aspiration, arrhythmia, respiratory failure and death), benefits (restoration of normal sinus rhythm) and alternatives of a direct current cardioversion were explained in detail to Jesse Vargas and he agrees to proceed.       Dispo: Patient return to clinic to see MD/APP 2 to 3 weeks postprocedure with an EKG return or sooner if needed  Signed, Kellie Chisolm, NP

## 2023-12-21 ENCOUNTER — Other Ambulatory Visit: Payer: Self-pay | Admitting: Emergency Medicine

## 2023-12-21 DIAGNOSIS — Z79899 Other long term (current) drug therapy: Secondary | ICD-10-CM

## 2023-12-22 LAB — CBC
Hematocrit: 42.9 % (ref 37.5–51.0)
Hemoglobin: 14.1 g/dL (ref 13.0–17.7)
MCH: 31.8 pg (ref 26.6–33.0)
MCHC: 32.9 g/dL (ref 31.5–35.7)
MCV: 97 fL (ref 79–97)
Platelets: 153 10*3/uL (ref 150–450)
RBC: 4.43 x10E6/uL (ref 4.14–5.80)
RDW: 13.4 % (ref 11.6–15.4)
WBC: 8 10*3/uL (ref 3.4–10.8)

## 2023-12-22 LAB — LDL CHOLESTEROL, DIRECT: LDL Direct: 35 mg/dL (ref 0–99)

## 2023-12-22 LAB — BASIC METABOLIC PANEL WITH GFR
BUN/Creatinine Ratio: 15 (ref 10–24)
BUN: 22 mg/dL (ref 8–27)
CO2: 23 mmol/L (ref 20–29)
Calcium: 9.8 mg/dL (ref 8.6–10.2)
Chloride: 101 mmol/L (ref 96–106)
Creatinine, Ser: 1.44 mg/dL — ABNORMAL HIGH (ref 0.76–1.27)
Glucose: 91 mg/dL (ref 70–99)
Potassium: 3.7 mmol/L (ref 3.5–5.2)
Sodium: 142 mmol/L (ref 134–144)
eGFR: 54 mL/min/{1.73_m2} — ABNORMAL LOW (ref >59)

## 2023-12-25 ENCOUNTER — Ambulatory Visit: Payer: Self-pay | Admitting: Cardiology

## 2023-12-25 NOTE — Progress Notes (Signed)
 Slight uptick noted in kidney function likely represented of dehydration.  Recommend increasing oral hydration throughout the day.  CBC has remained stable and cholesterol is within goal.

## 2023-12-27 ENCOUNTER — Telehealth: Payer: Self-pay | Admitting: *Deleted

## 2023-12-27 MED ORDER — SODIUM CHLORIDE 0.9% FLUSH: 3.0000 mL | INTRAVENOUS | Status: DC | PRN

## 2023-12-27 MED ORDER — SODIUM CHLORIDE 0.9% FLUSH: 3.0000 mL | Freq: Two times a day (BID) | INTRAVENOUS | Status: DC

## 2023-12-27 NOTE — Telephone Encounter (Signed)
 Scheduled for: DCCV Date: 12/28/23 Time: 12:00 pm Arrival Time: 11:00 am Location: [facility] Instructions: Reviewed  Patient verbalized understanding and confirmed date, time, location, and instructions. No further needs.

## 2023-12-28 ENCOUNTER — Other Ambulatory Visit: Payer: Self-pay

## 2023-12-28 ENCOUNTER — Ambulatory Visit: Admitting: Certified Registered Nurse Anesthetist

## 2023-12-28 ENCOUNTER — Encounter
Admission: RE | Disposition: A | Discharge status: Home / Self Care | Payer: Self-pay | Source: Home / Self Care | Attending: Cardiovascular Disease

## 2023-12-28 ENCOUNTER — Ambulatory Visit
Admission: RE | Admit: 2023-12-28 | Discharge: 2023-12-28 | Disposition: A | Discharge status: Home / Self Care | Attending: Cardiovascular Disease | Admitting: Cardiovascular Disease

## 2023-12-28 DIAGNOSIS — Z87891 Personal history of nicotine dependence: Secondary | ICD-10-CM | POA: Diagnosis not present

## 2023-12-28 DIAGNOSIS — K219 Gastro-esophageal reflux disease without esophagitis: Secondary | ICD-10-CM | POA: Diagnosis not present

## 2023-12-28 DIAGNOSIS — Z8616 Personal history of COVID-19: Secondary | ICD-10-CM | POA: Insufficient documentation

## 2023-12-28 DIAGNOSIS — I10 Essential (primary) hypertension: Secondary | ICD-10-CM | POA: Insufficient documentation

## 2023-12-28 DIAGNOSIS — Z79899 Other long term (current) drug therapy: Secondary | ICD-10-CM | POA: Diagnosis not present

## 2023-12-28 DIAGNOSIS — E669 Obesity, unspecified: Secondary | ICD-10-CM | POA: Diagnosis not present

## 2023-12-28 DIAGNOSIS — Z7901 Long term (current) use of anticoagulants: Secondary | ICD-10-CM | POA: Insufficient documentation

## 2023-12-28 DIAGNOSIS — E782 Mixed hyperlipidemia: Secondary | ICD-10-CM | POA: Insufficient documentation

## 2023-12-28 DIAGNOSIS — I4891 Unspecified atrial fibrillation: Secondary | ICD-10-CM | POA: Diagnosis not present

## 2023-12-28 DIAGNOSIS — G4733 Obstructive sleep apnea (adult) (pediatric): Secondary | ICD-10-CM | POA: Diagnosis not present

## 2023-12-28 DIAGNOSIS — E119 Type 2 diabetes mellitus without complications: Secondary | ICD-10-CM | POA: Diagnosis not present

## 2023-12-28 DIAGNOSIS — I480 Paroxysmal atrial fibrillation: Secondary | ICD-10-CM | POA: Diagnosis not present

## 2023-12-28 DIAGNOSIS — Z6829 Body mass index (BMI) 29.0-29.9, adult: Secondary | ICD-10-CM | POA: Diagnosis not present

## 2023-12-28 DIAGNOSIS — I2510 Atherosclerotic heart disease of native coronary artery without angina pectoris: Secondary | ICD-10-CM | POA: Insufficient documentation

## 2023-12-28 DIAGNOSIS — R0602 Shortness of breath: Secondary | ICD-10-CM | POA: Diagnosis present

## 2023-12-28 HISTORY — PX: CARDIOVERSION: SHX1299

## 2023-12-28 LAB — GLUCOSE, CAPILLARY: Glucose-Capillary: 81 mg/dL (ref 70–99)

## 2023-12-28 SURGERY — CARDIOVERSION
Anesthesia: General

## 2023-12-28 MED ORDER — SODIUM CHLORIDE 0.9 % IV SOLN: INTRAVENOUS | Status: DC | PRN

## 2023-12-28 MED ORDER — PROPOFOL 10 MG/ML IV BOLUS
INTRAVENOUS | Status: DC | PRN
  Administered 2023-12-28 (×2): 20 mg via INTRAVENOUS
  Administered 2023-12-28: 40 mg via INTRAVENOUS
  Administered 2023-12-28: 30 mg via INTRAVENOUS

## 2023-12-28 MED ORDER — PROPOFOL 1000 MG/100ML IV EMUL
INTRAVENOUS | Status: AC
  Filled 2023-12-28: qty 100

## 2023-12-28 NOTE — Anesthesia Postprocedure Evaluation (Signed)
 Anesthesia Post Note  Patient: Jesse Vargas  Procedure(s) Performed: CARDIOVERSION  Anesthesia Type: General Anesthetic complications: no   No notable events documented.   Last Vitals:  Vitals:   12/28/23 1345 12/28/23 1400  BP: 107/73 121/84  Pulse: 64 64  Resp: 19 15  Temp:  36.4 C  SpO2: 97% 97%    Last Pain:  Vitals:   12/28/23 1400  TempSrc: Axillary  PainSc: 0-No pain                 VAN STAVEREN,Vadie Principato

## 2023-12-28 NOTE — CV Procedure (Signed)
 Cardioversion procedure note For atrial fibrillation persistent.  Procedure Details:  Consent: Risks of procedure as well as the alternatives and risks of each were explained to the (patient/caregiver).  Consent for procedure obtained.  Time Out: Verified patient identification, verified procedure, site/side was marked, verified correct patient position, special equipment/implants available, medications/allergies/relevent history reviewed, required imaging and test results available.  Performed  Patient placed on cardiac monitor, pulse oximetry, supplemental oxygen as necessary.   Sedation given: propofol IV, Dr. Elysa Hancock Pacer pads placed anterior and posterior chest.   Cardioverted 4 time(s).   Cardioverted at  150J, 200J, 200J, 200J with manual compression. Synchronized biphasic Converted to NSR Each episode would convert to normal sinus rhythm briefly before converting back to atrial fibrillation after 1-3  minute   Evaluation: Findings: Post procedure EKG shows: NSR Complications: None Patient did tolerate procedure well.  Time Spent Directly with the Patient:  45 minutes   Tim Elleah Hemsley, M.D., Ph.D.

## 2023-12-28 NOTE — Anesthesia Preprocedure Evaluation (Signed)
 Anesthesia Evaluation  Patient identified by MRN, date of birth, ID band Patient awake    Reviewed: Allergy & Precautions, NPO status , Patient's Chart, lab work & pertinent test results  Airway Mallampati: II  TM Distance: >3 FB Neck ROM: Full    Dental  (+) Teeth Intact, Chipped,    Pulmonary neg pulmonary ROS, sleep apnea and Continuous Positive Airway Pressure Ventilation , former smoker   Pulmonary exam normal breath sounds clear to auscultation       Cardiovascular hypertension, Pt. on medications + CAD  negative cardio ROS Normal cardiovascular exam+ dysrhythmias Atrial Fibrillation  Rhythm:Regular Rate:Normal     Neuro/Psych  Headaches  Anxiety     negative neurological ROS  negative psych ROS   GI/Hepatic negative GI ROS, Neg liver ROS,GERD  Medicated,,  Endo/Other  negative endocrine ROSdiabetes, Type 2    Renal/GU Renal diseasenegative Renal ROS  negative genitourinary   Musculoskeletal   Abdominal Normal abdominal exam  (+)   Peds  Hematology negative hematology ROS (+)   Anesthesia Other Findings Past Medical History: No date: CAD (coronary artery disease)     Comment:  a. LHC (12/19/13):  mid LAD 50-60%, EF 55%, patent CFX               and RCA - med Rx. No date: Cancer of kidney -status post nephrectomy     Comment:  Kidney cancer 1999 No date: Chronic neck pain No date: Chronic pain syndrome No date: Diabetes mellitus No date: Family history of long QT syndrome No date: Fibromyalgia No date: GERD (gastroesophageal reflux disease) No date: Hx of echocardiogram     Comment:  a.  Echo (12/2013):  mod LVH, EF 55-60%, no RWMA, mild               LAE No date: Hyperlipidemia No date: Hypertension No date: Insomnia No date: Sleep apnea No date: SVT (supraventricular tachycardia) (HCC)     Comment:  Documentation pending but occurring during the stress               test with Dr. Beau Bound; s/p AVNRT  ablation 08/2013 by Dr               Carolynne Citron  Past Surgical History: 08/27/13: ABLATION     Comment:  AVNRT ablatin by Dr Carolynne Citron No date: CHOLECYSTECTOMY     Comment:  2000 No date: elbow surgery     Comment:  left No date: INNER EAR SURGERY     Comment:  R ear 12/19/2013: LEFT HEART CATHETERIZATION WITH CORONARY ANGIOGRAM; N/A     Comment:  Procedure: LEFT HEART CATHETERIZATION WITH CORONARY               ANGIOGRAM;  Surgeon: Mickiel Albany, MD;  Location: [facility]              CATH LAB;  Service: Cardiovascular;  Laterality: N/A; No date: NASAL SINUS SURGERY     Comment:  2005 No date: NEPHRECTOMY     Comment:  L---1999 Cancer 08/27/2013: SUPRAVENTRICULAR TACHYCARDIA ABLATION; N/A     Comment:  Procedure: SUPRAVENTRICULAR TACHYCARDIA ABLATION;                Surgeon: Tammie Fall, MD;  Location: [facility] CATH LAB;                Service: Cardiovascular;  Laterality: N/A; No date: TONSILLECTOMY     Comment:  08/1988 No date: UVULOPALATOPHARYNGOPLASTY  Comment:  08/1988  BMI    Body Mass Index: 29.53 kg/m      Reproductive/Obstetrics negative OB ROS                             Anesthesia Physical Anesthesia Plan  ASA: 2  Anesthesia Plan: General   Post-op Pain Management:    Induction: Intravenous  PONV Risk Score and Plan: Propofol infusion and TIVA  Airway Management Planned: Natural Airway and Nasal Cannula  Additional Equipment:   Intra-op Plan:   Post-operative Plan:   Informed Consent: I have reviewed the patients History and Physical, chart, labs and discussed the procedure including the risks, benefits and alternatives for the proposed anesthesia with the patient or authorized representative who has indicated his/her understanding and acceptance.     Dental Advisory Given  Plan Discussed with: CRNA  Anesthesia Plan Comments:        Anesthesia Quick Evaluation

## 2023-12-28 NOTE — Transfer of Care (Signed)
 Immediate Anesthesia Transfer of Care Note  Patient: Jesse Vargas  Procedure(s) Performed: CARDIOVERSION  Patient Location: [facility]  Anesthesia Type:General  Level of Consciousness: sedated and drowsy  Airway & Oxygen Therapy: Patient Spontanous Breathing and Patient connected to nasal cannula oxygen  Post-op Assessment: Report given to RN and Post -op Vital signs reviewed and stable  Post vital signs: Reviewed and stable  Last Vitals:  Vitals Value Taken Time  BP 133/91 12/28/23 13:01  Temp 36.6 C 12/28/23 11:16  Pulse 61 12/28/23 13:01  Resp 19 12/28/23 13:01  SpO2 96 % 12/28/23 13:01    Last Pain:  Vitals:   12/28/23 1116  TempSrc: Oral  PainSc: 0-No pain         Complications: No notable events documented.

## 2023-12-28 NOTE — Anesthesia Procedure Notes (Signed)
 Date/Time: 12/28/2023 12:49 PM  Performed by: Angelia Kelp, CRNAPre-anesthesia Checklist: Patient identified, Emergency Drugs available, Suction available, Patient being monitored and Timeout performed Patient Re-evaluated:Patient Re-evaluated prior to induction Oxygen Delivery Method: Nasal cannula Preoxygenation: Pre-oxygenation with 100% oxygen Induction Type: IV induction

## 2023-12-30 DIAGNOSIS — I480 Paroxysmal atrial fibrillation: Secondary | ICD-10-CM

## 2023-12-30 NOTE — H&P (Signed)

## 2023-12-30 NOTE — Interval H&P Note (Signed)
 History and Physical Interval Note:  12/30/2023 12:31 PM  Jesse Vargas  has presented today for surgery, with the diagnosis of Cardioversion  Afib.  The various methods of treatment have been discussed with the patient and family. After consideration of risks, benefits and other options for treatment, the patient has consented to  Procedure(s): CARDIOVERSION (N/A) as a surgical intervention.  The patient's history has been reviewed, patient examined, no change in status, stable for surgery.  I have reviewed the patient's chart and labs.  Questions were answered to the patient's satisfaction.     Monalisa Bayless

## 2023-12-31 ENCOUNTER — Encounter: Payer: Self-pay | Admitting: Cardiovascular Disease

## 2024-01-01 NOTE — Addendum Note (Signed)
 Addended by: MAUDINE SLOUGH D on: 01/01/2024 09:39 AM   Modules accepted: Orders

## 2024-01-01 NOTE — Telephone Encounter (Signed)
 With his continuous A-fib and symptoms, recommend referral to EP for options related to rate versus rhythm control strategies.

## 2024-01-07 NOTE — Progress Notes (Signed)
 Electrophysiology Office Note:   Date:  01/08/2024  ID:  Jesse Vargas, DOB Aug 17, 1955, MRN [id number]  Primary Cardiologist: Vina Gull, MD Electrophysiologist: Fonda Kitty, MD      History of Present Illness:   Jesse Vargas is a 68 y.o. male with h/o coronary artery disease with last heart catheterization (2007), primary hypertension, SVT status post ablation for AVNRT (2015), hyperlipidemia, OSA, paroxysmal atrial fibrillation, type 2 diabetes, obesity who is being seen today for evaluation of his atrial fibrillation.  Discussed the use of AI scribe software for clinical note transcription with the patient, who gave verbal consent to proceed.  History of Present Illness Jesse Vargas is a 68 year old male with atrial fibrillation who presents for evaluation of persistent Afib. He has been experiencing atrial fibrillation for approximately four years, with an increase in frequency over the past six months, spending more time in AF than sinus. During episodes of AFib, he experiences headaches, particularly on the top of his head, and sometimes lower headaches. A recent cardioversion maintained normal rhythm for about 28 hours before reverting to AFib. He previously underwent a cardiac ablation years ago for AVNRT, which successfully resolved his symptoms at that time. He is currently taking Eliquis , which he takes twice daily without missed doses. He does not eat breakfast and is trying to lose weight, consuming mushroom coffee in the morning. 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 Due to a downsizing of his building, he is currently involved in moving and lifting, which he is able to do without too much difficulty. Otherwise no new or acute complaints.   Review of systems complete and found to be negative unless listed in HPI.   EP Information / Studies Reviewed:    EKG is ordered today. Personal review as below.  EKG  Interpretation Date/Time:  Tuesday January 08 2024 08:59:27 EDT Ventricular Rate:  123 PR Interval:    QRS Duration:  88 QT Interval:  296 QTC Calculation: 423 R Axis:   77  Text Interpretation: Atrial fibrillation with rapid ventricular response When compared with ECG of 28-Dec-2023 12:53,  Atrial fibrillation has replaced sinus rhythm Confirmed by Kitty Fonda [phone]) on 01/08/2024 9:21:56 AM   EKG 12/28/23: AF   Zio 04/2022:  Patient had a min HR of 46 bpm, max HR of 152 bpm, and avg HR of 61 bpm. Predominant underlying rhythm was Sinus Rhythm. Atrial Fibrillation/Flutter occurred (3% burden), ranging from 55-152 bpm (avg of 87 bpm), the longest lasting 8 hours 13 mins with  an avg rate of 85 bpm. Atrial Fibrillation/Flutter was detected within +/- 45 seconds of symptomatic patient event(s). Isolated SVEs were rare (<1.0%), SVE Couplets were rare (<1.0%), and SVE Triplets were rare (<1.0%). Isolated VEs were rare (<1.0%,  378), VE Couplets were rare (<1.0%, 12), and VE Triplets were rare (<1.0%, 3).   Echo 11/28/23:  1. Left ventricular ejection fraction, by estimation, is 60 to 65%. The  left ventricle has normal function. The left ventricle has no regional  wall motion abnormalities. Left ventricular diastolic parameters are  indeterminate.   2. Right ventricular systolic function is normal. The right ventricular  size is normal. There is normal pulmonary artery systolic pressure. The  estimated right ventricular systolic pressure is 24.4 mmHg.   3. The mitral valve is normal in structure. Mild to moderate mitral valve  regurgitation. No evidence of mitral stenosis.   4. The aortic valve is normal in  structure. Aortic valve regurgitation is  not visualized. Aortic valve sclerosis is present, with no evidence of  aortic valve stenosis.   5. The inferior vena cava is normal in size with greater than 50%  respiratory variability, suggesting right atrial pressure of 3 mmHg.   6. Rhythm  suggestive of atrial fibrillation   Risk Assessment/Calculations:    CHA2DS2-VASc Score = 4   This indicates a 4.8% annual risk of stroke. The patient's score is based upon: CHF History: 0 HTN History: 1 Diabetes History: 1 Stroke History: 0 Vascular Disease History: 1 Age Score: 1 Gender Score: 0             Physical Exam:   VS:  BP 120/84   Pulse (!) 123   Ht 5' 9 (1.753 m)   Wt 201 lb 6.4 oz (91.4 kg)   SpO2 95%   BMI 29.74 kg/m    Wt Readings from Last 3 Encounters:  01/08/24 201 lb 6.4 oz (91.4 kg)  12/28/23 200 lb (90.7 kg)  12/18/23 202 lb 6.4 oz (91.8 kg)     GEN: Well nourished, well developed in no acute distress NECK: No JVD CARDIAC: Tachycardic, irregular  RESPIRATORY:  Clear to auscultation without rales, wheezing or rhonchi  ABDOMEN: Soft, non-distended EXTREMITIES:  No edema; No deformity   ASSESSMENT AND PLAN:    #. Persistent atrial fibrillation, symptomatic: Multiple cardioversions with ERAF. #. Hypercoagulable state due to AF: - Discussed treatment options today for AF including antiarrhythmic drug therapy and ablation. Discussed risks, recovery and likelihood of success with each treatment strategy. Risk, benefits, and alternatives to EP study and ablation for afib were discussed. These risks include but are not limited to stroke, bleeding, vascular damage, tamponade, perforation, damage to the esophagus, lungs, phrenic nerve and other structures, pulmonary vein stenosis, worsening renal function, coronary vasospasm and death.  Discussed potential need for repeat ablation procedures and antiarrhythmic drugs after an initial ablation. The patient understands these risk and wishes to proceed.  We will therefore proceed with catheter ablation at the next available time.  Carto, ICE, anesthesia are requested for the procedure.  Will also obtain CT PV protocol prior to the procedure to exclude LAA thrombus and further evaluate atrial anatomy. - Start  amiodarone  as bridge to ablation. 400mg  BID x 1 week, 200mg  BID x 1 week, then 200mg  daily thereafter. Repeat cardioversion in 2 weeks. Plan to stop on day of ablation.  - Continue Eliquis  5 mg twice daily.  #. SVT s/p AVNRT ablation:  - Full EP study at time of ablation to see if AVNRT is inducible.  #Hypertension -At goal today.  Recommend checking blood pressures 1-2 times per week at home and recording the values.  Recommend bringing these recordings to the primary care physician.  #CAD:  -Continue atorvastatin , Zetia , fenofibrate , icosapent  ethyl and follow-up with general cardiologist.  Follow up with Dr. Kennyth 3 months after ablation.   Signed, Fonda Kennyth, MD

## 2024-01-07 NOTE — H&P (View-Only) (Signed)
 Electrophysiology Office Note:   Date:  01/08/2024  ID:  Jesse Vargas, DOB Aug 17, 1955, MRN [id number]  Primary Cardiologist: Vina Gull, MD Electrophysiologist: Fonda Kitty, MD      History of Present Illness:   Jesse Vargas is a 68 y.o. male with h/o coronary artery disease with last heart catheterization (2007), primary hypertension, SVT status post ablation for AVNRT (2015), hyperlipidemia, OSA, paroxysmal atrial fibrillation, type 2 diabetes, obesity who is being seen today for evaluation of his atrial fibrillation.  Discussed the use of AI scribe software for clinical note transcription with the patient, who gave verbal consent to proceed.  History of Present Illness Jesse Vargas is a 68 year old male with atrial fibrillation who presents for evaluation of persistent Afib. He has been experiencing atrial fibrillation for approximately four years, with an increase in frequency over the past six months, spending more time in AF than sinus. During episodes of AFib, he experiences headaches, particularly on the top of his head, and sometimes lower headaches. A recent cardioversion maintained normal rhythm for about 28 hours before reverting to AFib. He previously underwent a cardiac ablation years ago for AVNRT, which successfully resolved his symptoms at that time. He is currently taking Eliquis , which he takes twice daily without missed doses. He does not eat breakfast and is trying to lose weight, consuming mushroom coffee in the morning. 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 Due to a downsizing of his building, he is currently involved in moving and lifting, which he is able to do without too much difficulty. Otherwise no new or acute complaints.   Review of systems complete and found to be negative unless listed in HPI.   EP Information / Studies Reviewed:    EKG is ordered today. Personal review as below.  EKG  Interpretation Date/Time:  Tuesday January 08 2024 08:59:27 EDT Ventricular Rate:  123 PR Interval:    QRS Duration:  88 QT Interval:  296 QTC Calculation: 423 R Axis:   77  Text Interpretation: Atrial fibrillation with rapid ventricular response When compared with ECG of 28-Dec-2023 12:53,  Atrial fibrillation has replaced sinus rhythm Confirmed by Kitty Fonda [phone]) on 01/08/2024 9:21:56 AM   EKG 12/28/23: AF   Zio 04/2022:  Patient had a min HR of 46 bpm, max HR of 152 bpm, and avg HR of 61 bpm. Predominant underlying rhythm was Sinus Rhythm. Atrial Fibrillation/Flutter occurred (3% burden), ranging from 55-152 bpm (avg of 87 bpm), the longest lasting 8 hours 13 mins with  an avg rate of 85 bpm. Atrial Fibrillation/Flutter was detected within +/- 45 seconds of symptomatic patient event(s). Isolated SVEs were rare (<1.0%), SVE Couplets were rare (<1.0%), and SVE Triplets were rare (<1.0%). Isolated VEs were rare (<1.0%,  378), VE Couplets were rare (<1.0%, 12), and VE Triplets were rare (<1.0%, 3).   Echo 11/28/23:  1. Left ventricular ejection fraction, by estimation, is 60 to 65%. The  left ventricle has normal function. The left ventricle has no regional  wall motion abnormalities. Left ventricular diastolic parameters are  indeterminate.   2. Right ventricular systolic function is normal. The right ventricular  size is normal. There is normal pulmonary artery systolic pressure. The  estimated right ventricular systolic pressure is 24.4 mmHg.   3. The mitral valve is normal in structure. Mild to moderate mitral valve  regurgitation. No evidence of mitral stenosis.   4. The aortic valve is normal in  structure. Aortic valve regurgitation is  not visualized. Aortic valve sclerosis is present, with no evidence of  aortic valve stenosis.   5. The inferior vena cava is normal in size with greater than 50%  respiratory variability, suggesting right atrial pressure of 3 mmHg.   6. Rhythm  suggestive of atrial fibrillation   Risk Assessment/Calculations:    CHA2DS2-VASc Score = 4   This indicates a 4.8% annual risk of stroke. The patient's score is based upon: CHF History: 0 HTN History: 1 Diabetes History: 1 Stroke History: 0 Vascular Disease History: 1 Age Score: 1 Gender Score: 0             Physical Exam:   VS:  BP 120/84   Pulse (!) 123   Ht 5' 9 (1.753 m)   Wt 201 lb 6.4 oz (91.4 kg)   SpO2 95%   BMI 29.74 kg/m    Wt Readings from Last 3 Encounters:  01/08/24 201 lb 6.4 oz (91.4 kg)  12/28/23 200 lb (90.7 kg)  12/18/23 202 lb 6.4 oz (91.8 kg)     GEN: Well nourished, well developed in no acute distress NECK: No JVD CARDIAC: Tachycardic, irregular  RESPIRATORY:  Clear to auscultation without rales, wheezing or rhonchi  ABDOMEN: Soft, non-distended EXTREMITIES:  No edema; No deformity   ASSESSMENT AND PLAN:    #. Persistent atrial fibrillation, symptomatic: Multiple cardioversions with ERAF. #. Hypercoagulable state due to AF: - Discussed treatment options today for AF including antiarrhythmic drug therapy and ablation. Discussed risks, recovery and likelihood of success with each treatment strategy. Risk, benefits, and alternatives to EP study and ablation for afib were discussed. These risks include but are not limited to stroke, bleeding, vascular damage, tamponade, perforation, damage to the esophagus, lungs, phrenic nerve and other structures, pulmonary vein stenosis, worsening renal function, coronary vasospasm and death.  Discussed potential need for repeat ablation procedures and antiarrhythmic drugs after an initial ablation. The patient understands these risk and wishes to proceed.  We will therefore proceed with catheter ablation at the next available time.  Carto, ICE, anesthesia are requested for the procedure.  Will also obtain CT PV protocol prior to the procedure to exclude LAA thrombus and further evaluate atrial anatomy. - Start  amiodarone  as bridge to ablation. 400mg  BID x 1 week, 200mg  BID x 1 week, then 200mg  daily thereafter. Repeat cardioversion in 2 weeks. Plan to stop on day of ablation.  - Continue Eliquis  5 mg twice daily.  #. SVT s/p AVNRT ablation:  - Full EP study at time of ablation to see if AVNRT is inducible.  #Hypertension -At goal today.  Recommend checking blood pressures 1-2 times per week at home and recording the values.  Recommend bringing these recordings to the primary care physician.  #CAD:  -Continue atorvastatin , Zetia , fenofibrate , icosapent  ethyl and follow-up with general cardiologist.  Follow up with Dr. Kennyth 3 months after ablation.   Signed, Fonda Kennyth, MD

## 2024-01-08 ENCOUNTER — Other Ambulatory Visit: Payer: Self-pay

## 2024-01-08 ENCOUNTER — Ambulatory Visit: Attending: Cardiology | Admitting: Cardiology

## 2024-01-08 ENCOUNTER — Encounter: Payer: Self-pay | Admitting: Cardiology

## 2024-01-08 VITALS — BP 120/84 | HR 123 | Ht 69.0 in | Wt 201.4 lb

## 2024-01-08 DIAGNOSIS — D6869 Other thrombophilia: Secondary | ICD-10-CM | POA: Diagnosis not present

## 2024-01-08 DIAGNOSIS — I10 Essential (primary) hypertension: Secondary | ICD-10-CM

## 2024-01-08 DIAGNOSIS — I4819 Other persistent atrial fibrillation: Secondary | ICD-10-CM | POA: Diagnosis not present

## 2024-01-08 DIAGNOSIS — I2510 Atherosclerotic heart disease of native coronary artery without angina pectoris: Secondary | ICD-10-CM | POA: Diagnosis not present

## 2024-01-08 DIAGNOSIS — I471 Supraventricular tachycardia, unspecified: Secondary | ICD-10-CM

## 2024-01-08 DIAGNOSIS — I4719 Other supraventricular tachycardia: Secondary | ICD-10-CM

## 2024-01-08 MED ORDER — AMIODARONE HCL 200 MG PO TABS: ORAL_TABLET | ORAL | 3 refills | Status: DC

## 2024-01-08 NOTE — Patient Instructions (Addendum)
 Medication Instructions:  Your physician has recommended you make the following change in your medication:  1) START Amiodarone  - take 2 tablets (400 mg total) TWICE a day for 1 week, then  - take 1 tablet (200 mg total) TWICE a day for 1 week, then  - take 1 tablet (200 mg total) ONCE a day  *If you need a refill on your cardiac medications before your next appointment, please call your pharmacy*  Testing/Procedures: Cardioversion  Your physician has recommended that you have a Cardioversion (DCCV). Electrical Cardioversion uses a jolt of electricity to your heart either through paddles or wired patches attached to your chest. This is a controlled, usually prescheduled, procedure. Defibrillation is done under light anesthesia in the hospital, and you usually go home the day of the procedure. This is done to get your heart back into a normal rhythm. You are not awake for the procedure. Please see the instruction sheet given to you today.  Ablation Your physician has recommended that you have an ablation. Catheter ablation is a medical procedure used to treat some cardiac arrhythmias (irregular heartbeats). During catheter ablation, a long, thin, flexible tube is put into a blood vessel in your groin (upper thigh), or neck. This tube is called an ablation catheter. It is then guided to your heart through the blood vessel. Radio frequency waves destroy small areas of heart tissue where abnormal heartbeats may cause an arrhythmia to start.   You are scheduled for Atrial Fibrillation Ablation on Friday, September 26 with Dr. Sidra Kitty. Please arrive at the Main Entrance A at [facility]: [address] [place], [place] [address] at 8:00am  What To Expect:  Labs: you will need to have lab work drawn within 30 days of your procedure. Please go to any [facility] location to have these drawn - no appointment is needed. You will receive procedure instructions either through MyChart or in the mail  4-6 week prior to your procedure.  After your procedure we recommend no driving for 3 days, no lifting over 10 lbs for 5 days, and no work or strenuous activity for 7 days.  Please contact our office at [phone] if you have any questions.   Follow-Up: We will contact you to schedule your post-procedure appointments.

## 2024-01-09 ENCOUNTER — Ambulatory Visit: Payer: Self-pay | Admitting: Cardiology

## 2024-01-09 LAB — BASIC METABOLIC PANEL WITH GFR
BUN/Creatinine Ratio: 16 (ref 10–24)
BUN: 15 mg/dL (ref 8–27)
CO2: 25 mmol/L (ref 20–29)
Calcium: 10.2 mg/dL (ref 8.6–10.2)
Chloride: 98 mmol/L (ref 96–106)
Creatinine, Ser: 0.92 mg/dL (ref 0.76–1.27)
Glucose: 84 mg/dL (ref 70–99)
Potassium: 4 mmol/L (ref 3.5–5.2)
Sodium: 140 mmol/L (ref 134–144)
eGFR: 92 mL/min/{1.73_m2} (ref >59)

## 2024-01-09 LAB — CBC
Hematocrit: 43.4 % (ref 37.5–51.0)
Hemoglobin: 14.3 g/dL (ref 13.0–17.7)
MCH: 32.1 pg (ref 26.6–33.0)
MCHC: 32.9 g/dL (ref 31.5–35.7)
MCV: 98 fL — ABNORMAL HIGH (ref 79–97)
Platelets: 165 10*3/uL (ref 150–450)
RBC: 4.45 x10E6/uL (ref 4.14–5.80)
RDW: 13.1 % (ref 11.6–15.4)
WBC: 5.6 10*3/uL (ref 3.4–10.8)

## 2024-01-17 ENCOUNTER — Ambulatory Visit: Attending: Cardiology | Admitting: Cardiology

## 2024-01-17 NOTE — Progress Notes (Deleted)
 Cardiology Office Note   Date:  01/17/2024  ID:  Jesse Vargas, DOB Apr 09, 1956, MRN [id number] PCP: Valora Agent, MD  [facility] Providers Cardiologist:  Vina Gull, MD Electrophysiologist:  Fonda Kitty, MD { Click to update primary MD,subspecialty MD or APP then REFRESH:1}    History of Present Illness Jesse Vargas is a 68 y.o. male with a past medical history of coronary artery disease with last heart catheterization (2007), primary pretension, SVT status post ablation for AVNRT (2015), hyperlipidemia, OSA, paroxysmal atrial fibrillation, type 2 diabetes, obesity, who presents today for follow-up of his coronary artery disease and paroxysmal atrial fibrillation.   Previous Myoview  in 2012 was considered normal.  He was evaluated for chest pain in 2015 which led to [facility] which revealed a 50-60% stenosis to the LAD.  Echocardiogram revealed LVEF with normal wall motion G2 DD.  03/2022 he had a ZIO monitor which showed atrial fibrillation with a 3% burden at that time.   He was last seen in clinic 01/25/2019 for complaints of worsening peripheral edema, palpitations, fatigue, intermittent atrial fibrillation more frequently not been able to sleep well.  No medication changes that were made and further testing that was ordered.  With his increased palpitations had discussed with patient repeating ZIO monitor which she had preferred to follow-up with pulmonary prior to.  He was seen in clinic in 09/2023 with continued complaints of fatigue, shortness of breath, weight gain, and left-sided rib pain.  He was having a CT scan completed at [facility] diagnostic [facility] center on Friday.  He had concerns for hernia.  Denied any other associated symptoms.  He was scheduled for an updated echocardiogram.  With his symptoms he was scheduled for cardioversion procedure which unfortunately had to be canceled due to cost and lack of insurance coverage.  He was seen in clinic 12/18/2023 with  continued fatigue, occasional dizziness, DOE.  Recently diagnosed with COVID infection and was treated with doxycycline and Promethazine DM as well as steroids.  Continues to have elevated heart rates and rates remained in atrial fibrillation.  States he has not missed any doses of his apixaban  and had not had any issues with bleeding.  He was sent for updated labs.  Cardioversion was rescheduled.He underwent cardioversion procedure and he was shocked 4 times and converted to normal sinus rhythm.  Each episode was converted to normal sinus rhythm briefly before converting back to atrial fibrillation after 1 to 3 minutes on 12/28/2023.  With this continues atrial fibrillation it was recommended to refer him to EP for options related to rate control versus rhythm control strategies.   He was evaluated by Dr. Kitty on 01/08/2024.  His most recent cardioversion had maintained sinus rhythm for approximately 28 hours before reverting back to atrial fibrillation.  After further discussion it was decided that he would not undergo ablation procedure.  He was loaded with amiodarone  with a repeat cardioversion in 2 weeks and then plan to stop on the day of the ablation.  He was to continue with apixaban  5 mg twice daily.  Full EP study at the time of ablation to see if AVNRT is inducible.  There were no further medication changes that were made at that time.  Next cardioversion has been scheduled for 01/23/2024.  He returns to clinic today   ROS: 10 point review of system has been reviewed and considered negative except ones been listed in the HPI  Studies Reviewed      2D echo 11/28/2023  1. Left ventricular ejection fraction, by estimation, is 60 to 65%. The  left ventricle has normal function. The left ventricle has no regional  wall motion abnormalities. Left ventricular diastolic parameters are  indeterminate.   2. Right ventricular systolic function is normal. The right ventricular  size is normal. There is  normal pulmonary artery systolic pressure. The  estimated right ventricular systolic pressure is 24.4 mmHg.   3. The mitral valve is normal in structure. Mild to moderate mitral valve  regurgitation. No evidence of mitral stenosis.   4. The aortic valve is normal in structure. Aortic valve regurgitation is  not visualized. Aortic valve sclerosis is present, with no evidence of  aortic valve stenosis.   5. The inferior vena cava is normal in size with greater than 50%  respiratory variability, suggesting right atrial pressure of 3 mmHg.   6. Rhythm suggestive of atrial fibrillation    TTE 01/12/22 1. Left ventricular ejection fraction, by estimation, is 60 to 65%. The  left ventricle has normal function. The left ventricle has no regional  wall motion abnormalities. There is mild concentric left ventricular  hypertrophy. Left ventricular diastolic  parameters are consistent with Grade II diastolic dysfunction  (pseudonormalization). The average left ventricular global longitudinal  strain is -26.7 %. The global longitudinal strain is normal.   2. Right ventricular systolic function is normal. The right ventricular  size is mildly enlarged. There is normal pulmonary artery systolic  pressure.   3. Left atrial size was moderately dilated.   4. Right atrial size was mild to moderately dilated.   5. The mitral valve is normal in structure. Trivial mitral valve  regurgitation. No evidence of mitral stenosis.   6. The aortic valve is tricuspid. There is mild calcification of the  aortic valve. Aortic valve regurgitation is not visualized. Aortic valve  sclerosis is present, with no evidence of aortic valve stenosis.   7. The inferior vena cava is normal in size with greater than 50%  respiratory variability, suggesting right atrial pressure of 3 mmHg. Risk Assessment/Calculations  CHA2DS2-VASc Score = 4  {Confirm score is correct.  If not, click here to update score.  REFRESH note.  :1} This  indicates a 4.8% annual risk of stroke. The patient's score is based upon: CHF History: 0 HTN History: 1 Diabetes History: 1 Stroke History: 0 Vascular Disease History: 1 Age Score: 1 Gender Score: 0   {This patient has a significant risk of stroke if diagnosed with atrial fibrillation.  Please consider VKA or DOAC agent for anticoagulation if the bleeding risk is acceptable.   You can also use the SmartPhrase .[email or web address] for documentation.   :[id number]} No BP recorded.  {Refresh Note OR Click here to enter BP  :1}***       Physical Exam VS:  There were no vitals taken for this visit.       Wt Readings from Last 3 Encounters:  01/08/24 201 lb 6.4 oz (91.4 kg)  12/28/23 200 lb (90.7 kg)  12/18/23 202 lb 6.4 oz (91.8 kg)    GEN: Well nourished, well developed in no acute distress NECK: No JVD; No carotid bruits CARDIAC: ***RRR, no murmurs, rubs, gallops RESPIRATORY:  Clear to auscultation without rales, wheezing or rhonchi  ABDOMEN: Soft, non-tender, non-distended EXTREMITIES:  No edema; No deformity   ASSESSMENT AND PLAN Persistent atrial fibrillation Coronary artery disease in native coronary arteries without angina Mixed hyperlipidemia Primary hypertension Obstructive sleep apnea wears been compliant with CPAP therapy Type  2 diabetes    {Are you ordering a CV Procedure (e.g. stress test, cath, DCCV, TEE, etc)?   Press F2        :[id number]}  Dispo: ***  Signed, Breanna Mcdaniel, NP

## 2024-01-22 ENCOUNTER — Telehealth: Payer: Self-pay | Admitting: *Deleted

## 2024-01-22 NOTE — Telephone Encounter (Signed)
 Called to confirm/remind patient of their procedure.   Scheduled for: DCCV  [x]  Date 7/16  [x]  Time 07:30 am  [x]  Arrival time 06:30 am [x]  Location [facility]   [x]  Designated Driver  [x]  Instructions, time, and location reviewed with patient  [] VM Full/unable to leave message  []  Phone not in service   [x]  H&P within 30 days  [x]  EKG within 30 days  [x]  Orders  [x]  Labs  [x]  GFR N/A  [x]  Diet  [x]  Medication instructions Pt read back instructions.

## 2024-01-23 ENCOUNTER — Telehealth: Payer: Self-pay | Admitting: Cardiology

## 2024-01-23 ENCOUNTER — Ambulatory Visit: Admitting: Certified Registered"

## 2024-01-23 ENCOUNTER — Encounter
Admission: RE | Disposition: A | Discharge status: Home / Self Care | Payer: Self-pay | Source: Home / Self Care | Attending: Cardiovascular Disease

## 2024-01-23 ENCOUNTER — Other Ambulatory Visit: Payer: Self-pay

## 2024-01-23 ENCOUNTER — Ambulatory Visit
Admission: RE | Admit: 2024-01-23 | Discharge: 2024-01-23 | Disposition: A | Discharge status: Home / Self Care | Attending: Cardiovascular Disease | Admitting: Cardiovascular Disease

## 2024-01-23 ENCOUNTER — Encounter: Payer: Self-pay | Admitting: Cardiovascular Disease

## 2024-01-23 ENCOUNTER — Encounter: Payer: Self-pay | Admitting: Cardiology

## 2024-01-23 DIAGNOSIS — I4819 Other persistent atrial fibrillation: Secondary | ICD-10-CM | POA: Diagnosis not present

## 2024-01-23 DIAGNOSIS — Z7901 Long term (current) use of anticoagulants: Secondary | ICD-10-CM | POA: Diagnosis not present

## 2024-01-23 DIAGNOSIS — Z87891 Personal history of nicotine dependence: Secondary | ICD-10-CM | POA: Diagnosis not present

## 2024-01-23 DIAGNOSIS — G4733 Obstructive sleep apnea (adult) (pediatric): Secondary | ICD-10-CM | POA: Insufficient documentation

## 2024-01-23 DIAGNOSIS — I10 Essential (primary) hypertension: Secondary | ICD-10-CM | POA: Diagnosis not present

## 2024-01-23 DIAGNOSIS — Z79899 Other long term (current) drug therapy: Secondary | ICD-10-CM | POA: Diagnosis not present

## 2024-01-23 DIAGNOSIS — I471 Supraventricular tachycardia, unspecified: Secondary | ICD-10-CM | POA: Diagnosis not present

## 2024-01-23 DIAGNOSIS — K219 Gastro-esophageal reflux disease without esophagitis: Secondary | ICD-10-CM | POA: Insufficient documentation

## 2024-01-23 DIAGNOSIS — Z6829 Body mass index (BMI) 29.0-29.9, adult: Secondary | ICD-10-CM | POA: Insufficient documentation

## 2024-01-23 DIAGNOSIS — D6869 Other thrombophilia: Secondary | ICD-10-CM | POA: Insufficient documentation

## 2024-01-23 DIAGNOSIS — I2510 Atherosclerotic heart disease of native coronary artery without angina pectoris: Secondary | ICD-10-CM | POA: Insufficient documentation

## 2024-01-23 DIAGNOSIS — I4891 Unspecified atrial fibrillation: Secondary | ICD-10-CM | POA: Diagnosis present

## 2024-01-23 DIAGNOSIS — I480 Paroxysmal atrial fibrillation: Secondary | ICD-10-CM | POA: Diagnosis not present

## 2024-01-23 DIAGNOSIS — R0602 Shortness of breath: Secondary | ICD-10-CM | POA: Diagnosis present

## 2024-01-23 DIAGNOSIS — Z01818 Encounter for other preprocedural examination: Secondary | ICD-10-CM

## 2024-01-23 HISTORY — PX: CARDIOVERSION: SHX1299

## 2024-01-23 SURGERY — CARDIOVERSION
Anesthesia: General

## 2024-01-23 MED ORDER — PROPOFOL 1000 MG/100ML IV EMUL
INTRAVENOUS | Status: AC
  Filled 2024-01-23: qty 100

## 2024-01-23 MED ORDER — SODIUM CHLORIDE 0.9 % IV SOLN: INTRAVENOUS | Status: DC

## 2024-01-23 MED ORDER — PROPOFOL 10 MG/ML IV BOLUS
INTRAVENOUS | Status: DC | PRN
  Administered 2024-01-23: 30 mg via INTRAVENOUS
  Administered 2024-01-23: 60 mg via INTRAVENOUS
  Administered 2024-01-23: 10 mg via INTRAVENOUS

## 2024-01-23 NOTE — Anesthesia Postprocedure Evaluation (Signed)
 Anesthesia Post Note  Patient: Jesse Vargas  Procedure(s) Performed: CARDIOVERSION  Patient location during evaluation: PACU Anesthesia Type: General Level of consciousness: awake and alert Pain management: pain level controlled Vital Signs Assessment: post-procedure vital signs reviewed and stable Respiratory status: spontaneous breathing, nonlabored ventilation, respiratory function stable and patient connected to nasal cannula oxygen Cardiovascular status: blood pressure returned to baseline and stable Postop Assessment: no apparent nausea or vomiting Anesthetic complications: no   No notable events documented.   Last Vitals:  Vitals:   01/23/24 0736 01/23/24 0737  BP:    Pulse: 82 (!) 51  Resp: 20 17  Temp:    SpO2: (!) 88% 100%    Last Pain:  Vitals:   01/23/24 0659  TempSrc: Temporal  PainSc: 0-No pain                 Debby Mines

## 2024-01-23 NOTE — Telephone Encounter (Signed)
 Called patient to schedule a 16month fu appt. Patient states he had a Cardio version this morning that did not work. Has concerns to discuss please call & advise may need to come in sooner.

## 2024-01-23 NOTE — Anesthesia Preprocedure Evaluation (Signed)
 Anesthesia Evaluation  Patient identified by MRN, date of birth, ID band Patient awake    Reviewed: Allergy & Precautions, NPO status , Patient's Chart, lab work & pertinent test results  Airway Mallampati: II  TM Distance: >3 FB Neck ROM: Full    Dental  (+) Teeth Intact, Chipped,    Pulmonary neg pulmonary ROS, sleep apnea and Continuous Positive Airway Pressure Ventilation , former smoker   Pulmonary exam normal breath sounds clear to auscultation       Cardiovascular hypertension, Pt. on medications + CAD  negative cardio ROS Normal cardiovascular exam+ dysrhythmias Atrial Fibrillation  Rhythm:Regular Rate:Normal     Neuro/Psych  Headaches  Anxiety     negative neurological ROS  negative psych ROS   GI/Hepatic negative GI ROS, Neg liver ROS,GERD  Medicated,,  Endo/Other  negative endocrine ROSdiabetes, Type 2    Renal/GU Renal diseasenegative Renal ROS  negative genitourinary   Musculoskeletal   Abdominal Normal abdominal exam  (+)   Peds  Hematology negative hematology ROS (+)   Anesthesia Other Findings Past Medical History: No date: CAD (coronary artery disease)     Comment:  a. LHC (12/19/13):  mid LAD 50-60%, EF 55%, patent CFX               and RCA - med Rx. No date: Cancer of kidney -status post nephrectomy     Comment:  Kidney cancer 1999 No date: Chronic neck pain No date: Chronic pain syndrome No date: Diabetes mellitus No date: Family history of long QT syndrome No date: Fibromyalgia No date: GERD (gastroesophageal reflux disease) No date: Hx of echocardiogram     Comment:  a.  Echo (12/2013):  mod LVH, EF 55-60%, no RWMA, mild               LAE No date: Hyperlipidemia No date: Hypertension No date: Insomnia No date: Sleep apnea No date: SVT (supraventricular tachycardia) (HCC)     Comment:  Documentation pending but occurring during the stress               test with Dr. Beau Bound; s/p AVNRT  ablation 08/2013 by Dr               Carolynne Citron  Past Surgical History: 08/27/13: ABLATION     Comment:  AVNRT ablatin by Dr Carolynne Citron No date: CHOLECYSTECTOMY     Comment:  2000 No date: elbow surgery     Comment:  left No date: INNER EAR SURGERY     Comment:  R ear 12/19/2013: LEFT HEART CATHETERIZATION WITH CORONARY ANGIOGRAM; N/A     Comment:  Procedure: LEFT HEART CATHETERIZATION WITH CORONARY               ANGIOGRAM;  Surgeon: Mickiel Albany, MD;  Location: [facility]              CATH LAB;  Service: Cardiovascular;  Laterality: N/A; No date: NASAL SINUS SURGERY     Comment:  2005 No date: NEPHRECTOMY     Comment:  L---1999 Cancer 08/27/2013: SUPRAVENTRICULAR TACHYCARDIA ABLATION; N/A     Comment:  Procedure: SUPRAVENTRICULAR TACHYCARDIA ABLATION;                Surgeon: Tammie Fall, MD;  Location: [facility] CATH LAB;                Service: Cardiovascular;  Laterality: N/A; No date: TONSILLECTOMY     Comment:  08/1988 No date: UVULOPALATOPHARYNGOPLASTY  Comment:  08/1988  BMI    Body Mass Index: 29.53 kg/m      Reproductive/Obstetrics negative OB ROS                             Anesthesia Physical Anesthesia Plan  ASA: 2  Anesthesia Plan: General   Post-op Pain Management:    Induction: Intravenous  PONV Risk Score and Plan: Propofol infusion and TIVA  Airway Management Planned: Natural Airway and Nasal Cannula  Additional Equipment:   Intra-op Plan:   Post-operative Plan:   Informed Consent: I have reviewed the patients History and Physical, chart, labs and discussed the procedure including the risks, benefits and alternatives for the proposed anesthesia with the patient or authorized representative who has indicated his/her understanding and acceptance.     Dental Advisory Given  Plan Discussed with: CRNA  Anesthesia Plan Comments:        Anesthesia Quick Evaluation

## 2024-01-23 NOTE — CV Procedure (Signed)
 Cardioversion procedure note For atrial fibrillation persistent.  Procedure Details:  Consent: Risks of procedure as well as the alternatives and risks of each were explained to the (patient/caregiver).  Consent for procedure obtained.  Time Out: Verified patient identification, verified procedure, site/side was marked, verified correct patient position, special equipment/implants available, medications/allergies/relevent history reviewed, required imaging and test results available.  Performed  Patient placed on cardiac monitor, pulse oximetry, supplemental oxygen as necessary.   Sedation given: propofol  IV, Dr. Leavy Pacer pads placed anterior and posterior chest.   Cardioverted 1 time(s).   Cardioverted at  150J. Synchronized biphasic Converted to NSR After 10 minutes, he converted back to atrial fibrillation Cardioverted 1 time(s).   Cardioverted at  200J. Synchronized biphasic Converted to NSR After 5 minutes, converted back to atrial fibrillation   Evaluation: Findings: Post procedure EKG shows: NSR briefly for 5 minutes or so before converting back to atrial fibrillation Complications: None Patient did tolerate procedure well.  Time Spent Directly with the Patient:  45 minutes   Tim Sagan Maselli, M.D., Ph.D.

## 2024-01-23 NOTE — Anesthesia Procedure Notes (Signed)
 Procedure Name: MAC Date/Time: 01/23/2024 7:32 AM  Performed by: Lacretia Camelia NOVAK, CRNAPre-anesthesia Checklist: Patient identified, Emergency Drugs available, Suction available and Patient being monitored Patient Re-evaluated:Patient Re-evaluated prior to induction Oxygen Delivery Method: Nasal cannula

## 2024-01-23 NOTE — Transfer of Care (Addendum)
 Immediate Anesthesia Transfer of Care Note  Patient: Jesse Vargas  Procedure(s) Performed: CARDIOVERSION  Patient Location: PACU and special recoveries  Anesthesia Type:General  Level of Consciousness: drowsy and patient cooperative  Airway & Oxygen Therapy: Patient Spontanous Breathing and Patient connected to nasal cannula oxygen  Post-op Assessment: Report given to RN and Post -op Vital signs reviewed and stable  Post vital signs: Reviewed and stable  Last Vitals:  Vitals Value Taken Time  BP 107/78 01/23/24 07:35  Temp    Pulse 51 01/23/24 07:37  Resp 17 01/23/24 07:37  SpO2 100 % 01/23/24 07:37    Last Pain:  Vitals:   01/23/24 0659  TempSrc: Temporal  PainSc: 0-No pain         Complications: No notable events documented.

## 2024-01-24 ENCOUNTER — Encounter: Payer: Self-pay | Admitting: Cardiovascular Disease

## 2024-01-26 NOTE — H&P (Signed)

## 2024-01-26 NOTE — Interval H&P Note (Signed)
 History and Physical Interval Note:  01/26/2024 4:51 PM  Jesse Vargas  has presented today for surgery, with the diagnosis of Cardioversion   Afib.  The various methods of treatment have been discussed with the patient and family. After consideration of risks, benefits and other options for treatment, the patient has consented to  Procedure(s): CARDIOVERSION (N/A) as a surgical intervention.  The patient's history has been reviewed, patient examined, no change in status, stable for surgery.  I have reviewed the patient's chart and labs.  Questions were answered to the patient's satisfaction.     Angelena Sand

## 2024-01-28 ENCOUNTER — Encounter: Payer: Self-pay | Admitting: Cardiology

## 2024-02-25 ENCOUNTER — Ambulatory Visit: Admitting: Dermatology

## 2024-02-25 ENCOUNTER — Encounter: Payer: Self-pay | Admitting: Dermatology

## 2024-02-25 DIAGNOSIS — A630 Anogenital (venereal) warts: Secondary | ICD-10-CM

## 2024-02-25 DIAGNOSIS — C44519 Basal cell carcinoma of skin of other part of trunk: Secondary | ICD-10-CM | POA: Diagnosis not present

## 2024-02-25 DIAGNOSIS — D489 Neoplasm of uncertain behavior, unspecified: Secondary | ICD-10-CM | POA: Diagnosis not present

## 2024-02-25 DIAGNOSIS — C4491 Basal cell carcinoma of skin, unspecified: Secondary | ICD-10-CM

## 2024-02-25 DIAGNOSIS — R238 Other skin changes: Secondary | ICD-10-CM

## 2024-02-25 HISTORY — DX: Basal cell carcinoma of skin, unspecified: C44.91

## 2024-02-25 NOTE — Progress Notes (Signed)
   New Patient Visit   Subjective  Jesse Vargas is a 68 y.o. male who presents for the following: The patient has spots, moles and lesions to be evaluated, some may be new or changing and the patient may have concern these could be cancer. Spots are on his scalp, chest, and groin.     The following portions of the chart were reviewed this encounter and updated as appropriate: medications, allergies, medical history  Review of Systems:  No other skin or systemic complaints except as noted in HPI or Assessment and Plan.  Objective  Well appearing patient in no apparent distress; mood and affect are within normal limits.   A focused examination was performed of the following areas: Scalp, chest, groin.  Relevant exam findings are noted in the Assessment and Plan.  Central upper chest 1cm eroded pink plaque  L inguinal crease x12 (12) Verrucous papules -- Discussed viral etiology and contagion.   Assessment & Plan   HEMANGIOMA vs Blue nevus R scalp Exam: red papule(s) Discussed benign nature. Recommend observation. Call for changes.    I, Emerick Ege, CMA am acting as Neurosurgeon for Boneta Sharps, MD.   NEOPLASM OF UNCERTAIN BEHAVIOR Central upper chest Skin / nail biopsy Type of biopsy: tangential   Informed consent: discussed and consent obtained   Patient was prepped and draped in usual sterile fashion: Area prepped with alcohol. Anesthesia: the lesion was anesthetized in a standard fashion   Anesthetic:  1% lidocaine  w/ epinephrine 1-100,000 buffered w/ 8.4% NaHCO3 Instrument used: flexible razor blade   Hemostasis achieved with: pressure, aluminum chloride and electrodesiccation   Outcome: patient tolerated procedure well   Post-procedure details: wound care instructions given   Post-procedure details comment:  Ointment and small bandage applied  Related Procedures Pathology ([facility]) CONDYLOMA (12) L inguinal crease x12 (12) Destruction of lesion - L  inguinal crease x12 (12) Complexity: simple   Destruction method: cryotherapy   Informed consent: discussed and consent obtained   Timeout:  patient name, date of birth, surgical site, and procedure verified Lesion destroyed using liquid nitrogen: Yes   Region frozen until ice ball extended beyond lesion: Yes   Cryo cycles: 1 or 2. Outcome: patient tolerated procedure well with no complications   Post-procedure details: wound care instructions given   Additional details:  Prior to procedure, discussed risks of blister formation, small wound, skin dyspigmentation, or rare scar following cryotherapy. Recommend Vaseline ointment to treated areas while healing.    Return for Pending biopsy results.  I, Emerick Ege, CMA am acting as scribe for Boneta Sharps, MD.   Documentation: I have reviewed the above documentation for accuracy and completeness, and I agree with the above.  Boneta Sharps, MD

## 2024-02-25 NOTE — Patient Instructions (Addendum)

## 2024-02-27 ENCOUNTER — Encounter: Payer: Self-pay | Admitting: Cardiology

## 2024-02-28 ENCOUNTER — Ambulatory Visit: Payer: Self-pay | Admitting: Dermatology

## 2024-02-28 ENCOUNTER — Encounter: Payer: Self-pay | Admitting: Dermatology

## 2024-02-28 LAB — ANATOMIC PATHOLOGY REPORT

## 2024-02-28 NOTE — Telephone Encounter (Signed)
-----   Message from [place] sent at 02/28/2024  1:35 PM EDT ----- Diagnosis: Central Upper Chest ,Skin Biopsy: BASAL CELL  CARCINOMA, NODULAR TYPE   Please call to share diagnosis and schedule FBSE in 1 month or later  Explanation: your biopsy shows basal cell skin cancer in the second layer of the skin. This is the most common kind of skin cancer and is caused by damage from sun exposure. Basal cell skin cancers  almost never spread beyond the skin, so they are not dangerous to your overall health. However, they will continue to grow, can bleed, cause nonhealing wounds, and disrupt nearby structures unless  fully treated.   Treatment: Excision - you return for an hour long appointment in our clinic where we perform a skin surgery. We numb the site of the skin cancer and a safety margin of normal skin around it. We  remove the full thickness of skin and close the wound with two layers of stitches. The sample is sent to the lab to check that the skin cancer was fully removed. Return one week later to have wound  checked and surface stitches removed. Surgical wound leaves a line scar. Approximately 95% cure rate. Risk of recurrence, bleeding, infection, pain, injury to nearby structures, hypertrophic scar.       ----- Message ----- From: Interface, [facility] Lab Results In Sent: 02/28/2024   7:36 AM EDT To: Boneta Sharps, MD

## 2024-02-28 NOTE — Telephone Encounter (Signed)
 Advised pt of bx results.  Scheduled pt for excision 04/09/24 at 9:45am and scheduled pt for TBSE 04/23/24 at 4:00pm.

## 2024-03-10 ENCOUNTER — Other Ambulatory Visit: Payer: Self-pay

## 2024-03-10 DIAGNOSIS — I4891 Unspecified atrial fibrillation: Secondary | ICD-10-CM

## 2024-03-17 DIAGNOSIS — H2513 Age-related nuclear cataract, bilateral: Secondary | ICD-10-CM | POA: Diagnosis not present

## 2024-03-17 DIAGNOSIS — E119 Type 2 diabetes mellitus without complications: Secondary | ICD-10-CM | POA: Diagnosis not present

## 2024-03-17 DIAGNOSIS — H5203 Hypermetropia, bilateral: Secondary | ICD-10-CM | POA: Diagnosis not present

## 2024-03-17 DIAGNOSIS — H52223 Regular astigmatism, bilateral: Secondary | ICD-10-CM | POA: Diagnosis not present

## 2024-03-28 LAB — BASIC METABOLIC PANEL WITH GFR
BUN/Creatinine Ratio: 14 (ref 10–24)
BUN: 16 mg/dL (ref 8–27)
CO2: 25 mmol/L (ref 20–29)
Calcium: 9.8 mg/dL (ref 8.6–10.2)
Chloride: 99 mmol/L (ref 96–106)
Creatinine, Ser: 1.18 mg/dL (ref 0.76–1.27)
Glucose: 163 mg/dL — AB (ref 70–99)
Potassium: 4 mmol/L (ref 3.5–5.2)
Sodium: 139 mmol/L (ref 134–144)
eGFR: 68 mL/min/1.73 (ref >59)

## 2024-03-28 LAB — CBC
Hematocrit: 38.2 % (ref 37.5–51.0)
Hemoglobin: 12.8 g/dL — ABNORMAL LOW (ref 13.0–17.7)
MCH: 32.6 pg (ref 26.6–33.0)
MCHC: 33.5 g/dL (ref 31.5–35.7)
MCV: 97 fL (ref 79–97)
Platelets: 171 x10E3/uL (ref 150–450)
RBC: 3.93 x10E6/uL — ABNORMAL LOW (ref 4.14–5.80)
RDW: 13 % (ref 11.6–15.4)
WBC: 7.2 x10E3/uL (ref 3.4–10.8)

## 2024-04-03 NOTE — Pre-Procedure Instructions (Signed)
 Instructed patient on the following items: Arrival time 0800 Nothing to eat or drink after midnight No meds AM of procedure Responsible person to drive you home and stay with you for 24 hrs  Have you missed any doses of anti-coagulant Eliquis- takes twice a day, hasn't missed any doses.  Don't take dose morning of procedure.

## 2024-04-04 ENCOUNTER — Ambulatory Visit (HOSPITAL_COMMUNITY)
Admission: RE | Admit: 2024-04-04 | Discharge: 2024-04-04 | Disposition: A | Discharge status: Home / Self Care | Attending: Cardiology | Admitting: Cardiology

## 2024-04-04 ENCOUNTER — Encounter (HOSPITAL_COMMUNITY): Payer: Self-pay | Admitting: Cardiology

## 2024-04-04 ENCOUNTER — Other Ambulatory Visit: Payer: Self-pay

## 2024-04-04 ENCOUNTER — Ambulatory Visit (HOSPITAL_COMMUNITY)

## 2024-04-04 ENCOUNTER — Encounter (HOSPITAL_COMMUNITY)
Admission: RE | Disposition: A | Discharge status: Home / Self Care | Payer: Self-pay | Source: Home / Self Care | Attending: Cardiology

## 2024-04-04 DIAGNOSIS — K219 Gastro-esophageal reflux disease without esophagitis: Secondary | ICD-10-CM | POA: Diagnosis not present

## 2024-04-04 DIAGNOSIS — I471 Supraventricular tachycardia, unspecified: Secondary | ICD-10-CM | POA: Diagnosis not present

## 2024-04-04 DIAGNOSIS — I2510 Atherosclerotic heart disease of native coronary artery without angina pectoris: Secondary | ICD-10-CM | POA: Diagnosis not present

## 2024-04-04 DIAGNOSIS — E119 Type 2 diabetes mellitus without complications: Secondary | ICD-10-CM | POA: Insufficient documentation

## 2024-04-04 DIAGNOSIS — I10 Essential (primary) hypertension: Secondary | ICD-10-CM | POA: Diagnosis not present

## 2024-04-04 DIAGNOSIS — I4819 Other persistent atrial fibrillation: Secondary | ICD-10-CM | POA: Diagnosis not present

## 2024-04-04 DIAGNOSIS — D6869 Other thrombophilia: Secondary | ICD-10-CM | POA: Diagnosis not present

## 2024-04-04 DIAGNOSIS — Z7901 Long term (current) use of anticoagulants: Secondary | ICD-10-CM | POA: Insufficient documentation

## 2024-04-04 DIAGNOSIS — Z87891 Personal history of nicotine dependence: Secondary | ICD-10-CM

## 2024-04-04 HISTORY — PX: ATRIAL FIBRILLATION ABLATION: EP1191

## 2024-04-04 LAB — GLUCOSE, CAPILLARY
Glucose-Capillary: 101 mg/dL — ABNORMAL HIGH (ref 70–99)
Glucose-Capillary: 113 mg/dL — ABNORMAL HIGH (ref 70–99)

## 2024-04-04 LAB — POCT ACTIVATED CLOTTING TIME: Activated Clotting Time: 273 s

## 2024-04-04 MED ORDER — PROTAMINE SULFATE 10 MG/ML IV SOLN
INTRAVENOUS | Status: DC | PRN
Start: 2024-04-04 — End: 2024-04-04
  Administered 2024-04-04: 35 mg via INTRAVENOUS

## 2024-04-04 MED ORDER — HEPARIN SODIUM (PORCINE) 1000 UNIT/ML IJ SOLN
INTRAMUSCULAR | Status: DC | PRN
  Administered 2024-04-04: 14000 [IU] via INTRAVENOUS
  Administered 2024-04-04: 4000 [IU] via INTRAVENOUS

## 2024-04-04 MED ORDER — FENTANYL CITRATE (PF) 100 MCG/2ML IJ SOLN
INTRAMUSCULAR | Status: AC
  Filled 2024-04-04: qty 2

## 2024-04-04 MED ORDER — FENTANYL CITRATE (PF) 250 MCG/5ML IJ SOLN
INTRAMUSCULAR | Status: DC | PRN
  Administered 2024-04-04: 100 ug via INTRAVENOUS

## 2024-04-04 MED ORDER — SUGAMMADEX SODIUM 200 MG/2ML IV SOLN
INTRAVENOUS | Status: DC | PRN
  Administered 2024-04-04: 350 mg via INTRAVENOUS

## 2024-04-04 MED ORDER — MIDAZOLAM HCL 2 MG/2ML IJ SOLN
INTRAMUSCULAR | Status: AC
  Filled 2024-04-04: qty 2

## 2024-04-04 MED ORDER — SODIUM CHLORIDE 0.9 % IV SOLN: INTRAVENOUS | Status: DC

## 2024-04-04 MED ORDER — EPHEDRINE SULFATE-NACL 50-0.9 MG/10ML-% IV SOSY
PREFILLED_SYRINGE | INTRAVENOUS | Status: DC | PRN
  Administered 2024-04-04: 5 mg via INTRAVENOUS

## 2024-04-04 MED ORDER — LIDOCAINE 2% (20 MG/ML) 5 ML SYRINGE
INTRAMUSCULAR | Status: DC | PRN
  Administered 2024-04-04: 60 mg via INTRAVENOUS

## 2024-04-04 MED ORDER — HEPARIN (PORCINE) IN NACL 1000-0.9 UT/500ML-% IV SOLN
INTRAVENOUS | Status: DC | PRN
  Administered 2024-04-04 (×3): 500 mL

## 2024-04-04 MED ORDER — LACTATED RINGERS IV SOLN: INTRAVENOUS | Status: DC | PRN

## 2024-04-04 MED ORDER — ATROPINE SULFATE 1 MG/ML IV SOLN
INTRAVENOUS | Status: DC | PRN
  Administered 2024-04-04: 1 mg via INTRAVENOUS

## 2024-04-04 MED ORDER — PHENYLEPHRINE HCL-NACL 20-0.9 MG/250ML-% IV SOLN
INTRAVENOUS | Status: DC | PRN
  Administered 2024-04-04: 25 ug/min via INTRAVENOUS

## 2024-04-04 MED ORDER — MIDAZOLAM HCL 2 MG/2ML IJ SOLN
INTRAMUSCULAR | Status: DC | PRN
  Administered 2024-04-04: 2 mg via INTRAVENOUS

## 2024-04-04 MED ORDER — ONDANSETRON HCL 4 MG/2ML IJ SOLN
INTRAMUSCULAR | Status: DC | PRN
  Administered 2024-04-04: 4 mg via INTRAVENOUS

## 2024-04-04 MED ORDER — SODIUM CHLORIDE 0.9% FLUSH: 3.0000 mL | Freq: Two times a day (BID) | INTRAVENOUS | Status: DC

## 2024-04-04 MED ORDER — SODIUM CHLORIDE 0.9% FLUSH: 3.0000 mL | INTRAVENOUS | Status: DC | PRN

## 2024-04-04 MED ORDER — ONDANSETRON HCL 4 MG/2ML IJ SOLN: 4.0000 mg | Freq: Four times a day (QID) | INTRAMUSCULAR | Status: DC | PRN

## 2024-04-04 MED ORDER — ACETAMINOPHEN 325 MG PO TABS
650.0000 mg | ORAL_TABLET | ORAL | Status: DC | PRN
  Administered 2024-04-04: 650 mg via ORAL
  Filled 2024-04-04: qty 2

## 2024-04-04 MED ORDER — AMIODARONE HCL 200 MG PO TABS
200.0000 mg | ORAL_TABLET | Freq: Every day | ORAL | 0 refills | Status: DC
Start: 2024-04-04 — End: 2024-05-27

## 2024-04-04 MED ORDER — APIXABAN 5 MG PO TABS
5.0000 mg | ORAL_TABLET | Freq: Once | ORAL | Status: AC
  Administered 2024-04-04: 5 mg via ORAL
  Filled 2024-04-04: qty 1

## 2024-04-04 MED ORDER — SODIUM CHLORIDE 0.9 % IV SOLN: 250.0000 mL | INTRAVENOUS | Status: DC | PRN

## 2024-04-04 MED ORDER — ROCURONIUM BROMIDE 10 MG/ML (PF) SYRINGE
PREFILLED_SYRINGE | INTRAVENOUS | Status: DC | PRN
  Administered 2024-04-04: 70 mg via INTRAVENOUS
  Administered 2024-04-04: 20 mg via INTRAVENOUS

## 2024-04-04 MED ORDER — DEXAMETHASONE SODIUM PHOSPHATE 10 MG/ML IJ SOLN
INTRAMUSCULAR | Status: DC | PRN
  Administered 2024-04-04: 10 mg via INTRAVENOUS

## 2024-04-04 MED ORDER — PROPOFOL 10 MG/ML IV BOLUS
INTRAVENOUS | Status: DC | PRN
  Administered 2024-04-04: 120 mg via INTRAVENOUS

## 2024-04-04 NOTE — Anesthesia Procedure Notes (Signed)
 Procedure Name: Intubation Date/Time: 04/04/2024 9:49 AM  Performed by: Elby Raelene SAUNDERS, CRNAPre-anesthesia Checklist: Patient identified, Emergency Drugs available, Suction available and Patient being monitored Patient Re-evaluated:Patient Re-evaluated prior to induction Oxygen Delivery Method: Circle System Utilized Preoxygenation: Pre-oxygenation with 100% oxygen Induction Type: IV induction Ventilation: Mask ventilation without difficulty Laryngoscope Size: Miller and 2 Grade View: Grade II Tube type: Oral Tube size: 7.5 mm Number of attempts: 1 Airway Equipment and Method: Stylet and Bite block Placement Confirmation: ETT inserted through vocal cords under direct vision, positive ETCO2 and breath sounds checked- equal and bilateral Secured at: 23 cm Tube secured with: Tape Dental Injury: Teeth and Oropharynx as per pre-operative assessment

## 2024-04-04 NOTE — Transfer of Care (Signed)
 Immediate Anesthesia Transfer of Care Note  Patient: Jesse Vargas  Procedure(s) Performed: ATRIAL FIBRILLATION ABLATION  Patient Location: Cath Lab  Anesthesia Type:General  Level of Consciousness: Drowsy  Airway & Oxygen Therapy: Patient Spontanous Breathing and Patient connected to face mask oxygen  Post-op Assessment: Report given to RN and Post -op Vital signs reviewed and stable  Post vital signs: Reviewed and stable  Last Vitals:  Vitals Value Taken Time  BP    Temp    Pulse 60 04/04/24 11:34  Resp 13 04/04/24 11:34  SpO2 96 % 04/04/24 11:34  Vitals shown include unfiled device data.  Last Pain:  Vitals:   04/04/24 0828  TempSrc:   PainSc: 0-No pain         Complications: No notable events documented.

## 2024-04-04 NOTE — H&P (Signed)
 Electrophysiology Note:   Date:  04/04/24  ID:  Jesse Vargas, DOB Nov 08, 1955, MRN [id number]   Primary Cardiologist: Vina Gull, MD Electrophysiologist: Fonda Kitty, MD       History of Present Illness:   Jesse Vargas is a 68 y.o. male with h/o coronary artery disease with last heart catheterization (2007), primary hypertension, SVT status post ablation for AVNRT (2015), hyperlipidemia, OSA, paroxysmal atrial fibrillation, type 2 diabetes, obesity who is being seen today for evaluation of his atrial fibrillation.   Discussed the use of AI scribe software for clinical note transcription with the patient, who gave verbal consent to proceed.   History of Present Illness Jesse Vargas is a 68 year old male with atrial fibrillation who presents for evaluation of persistent Afib. He has been experiencing atrial fibrillation for approximately four years, with an increase in frequency over the past six months, spending more time in AF than sinus. During episodes of AFib, he experiences headaches, particularly on the top of his head, and sometimes lower headaches. A recent cardioversion maintained normal rhythm for about 28 hours before reverting to AFib. He previously underwent a cardiac ablation years ago for AVNRT, which successfully resolved his symptoms at that time. He is currently taking Eliquis , which he takes twice daily without missed doses. He does not eat breakfast and is trying to lose weight, consuming mushroom coffee in the morning. 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 Due to a downsizing of his building, he is currently involved in moving and lifting, which he is able to do without too much difficulty. Otherwise no new or acute complaints.    Interval: Patient presents today for planned ablation. Reports feeling relatively well. Had skin cancer removed 1 month ago. May need additional procedure. He was told he would not  need any interruption of his anti-coagulation. No new or acute complaints.  Review of systems complete and found to be negative unless listed in HPI.    EP Information / Studies Reviewed:      EKG Interpretation Date/Time:                  Tuesday January 08 2024 08:59:27 EDT Ventricular Rate:         123 PR Interval:                   QRS Duration:             88 QT Interval:                 296 QTC Calculation:423 R Axis:                         77   Text Interpretation:      Atrial fibrillation with rapid ventricular response When compared with ECG of 28-Dec-2023 12:53,  Atrial fibrillation has replaced sinus rhythm Confirmed by Kitty Fonda [phone]) on 01/08/2024 9:21:56 AM    EKG 12/28/23: AF    Zio 04/2022:  Patient had a min HR of 46 bpm, max HR of 152 bpm, and avg HR of 61 bpm. Predominant underlying rhythm was Sinus Rhythm. Atrial Fibrillation/Flutter occurred (3% burden), ranging from 55-152 bpm (avg of 87 bpm), the longest lasting 8 hours 13 mins with  an avg rate of 85 bpm. Atrial Fibrillation/Flutter was detected within +/- 45 seconds of symptomatic patient event(s). Isolated SVEs were rare (<1.0%), SVE Couplets were  rare (<1.0%), and SVE Triplets were rare (<1.0%). Isolated VEs were rare (<1.0%,  378), VE Couplets were rare (<1.0%, 12), and VE Triplets were rare (<1.0%, 3).    Echo 11/28/23:  1. Left ventricular ejection fraction, by estimation, is 60 to 65%. The  left ventricle has normal function. The left ventricle has no regional  wall motion abnormalities. Left ventricular diastolic parameters are  indeterminate.   2. Right ventricular systolic function is normal. The right ventricular  size is normal. There is normal pulmonary artery systolic pressure. The  estimated right ventricular systolic pressure is 24.4 mmHg.   3. The mitral valve is normal in structure. Mild to moderate mitral valve  regurgitation. No evidence of mitral stenosis.   4. The aortic valve is  normal in structure. Aortic valve regurgitation is  not visualized. Aortic valve sclerosis is present, with no evidence of  aortic valve stenosis.   5. The inferior vena cava is normal in size with greater than 50%  respiratory variability, suggesting right atrial pressure of 3 mmHg.   6. Rhythm suggestive of atrial fibrillation    Risk Assessment/Calculations:     CHA2DS2-VASc Score = 4   This indicates a 4.8% annual risk of stroke. The patient's score is based upon: CHF History: 0 HTN History: 1 Diabetes History: 1 Stroke History: 0 Vascular Disease History: 1 Age Score: 1 Gender Score: 0               Physical Exam:    Today's Vitals   04/04/24 0814 04/04/24 0828  BP: 122/76   Pulse: (!) 55   Resp: 14   Temp: 98 F (36.7 C)   TempSrc: Oral   SpO2: 95%   Weight: 88.5 kg   Height: 5' 9 (1.753 m)   PainSc:  0-No pain   Body mass index is 28.8 kg/m.   GEN: Well nourished, well developed in no acute distress NECK: No JVD CARDIAC: Bradycardic, regular  RESPIRATORY:  Clear to auscultation without rales, wheezing or rhonchi  ABDOMEN: Soft, non-distended EXTREMITIES:  No edema; No deformity    ASSESSMENT AND PLAN:     #. Persistent atrial fibrillation, symptomatic: Multiple cardioversions with ERAF. #. Hypercoagulable state due to AF: - Discussed treatment options today for AF including antiarrhythmic drug therapy and ablation. Discussed risks, recovery and likelihood of success with each treatment strategy. Risk, benefits, and alternatives to EP study and ablation for afib were discussed. These risks include but are not limited to stroke, bleeding, vascular damage, tamponade, perforation, damage to the esophagus, lungs, phrenic nerve and other structures, pulmonary vein stenosis, worsening renal function, coronary vasospasm and death.  Discussed potential need for repeat ablation procedures and antiarrhythmic drugs after an initial ablation. The patient understands  these risk and wishes to proceed.  We will therefore proceed with catheter ablation at the next available time.  Carto, ICE, anesthesia are requested for the procedure.  Will also obtain CT PV protocol prior to the procedure to exclude LAA thrombus and further evaluate atrial anatomy. - Will plan to stop amiodarone  in 1 month. - Continue Eliquis  5 mg twice daily.   #. SVT s/p AVNRT ablation:  - Full EP study at time of ablation to see if AVNRT is inducible.     Follow up with Dr. Kennyth 3 months after ablation.    Signed, Fonda Kennyth, MD

## 2024-04-04 NOTE — Discharge Instructions (Addendum)
 Stop amiodarone  in 1 month.  Cardiac Ablation, Care After  This sheet gives you information about how to care for yourself after your procedure. Your health care provider may also give you more specific instructions. If you have problems or questions, contact your health care provider. What can I expect after the procedure? After the procedure, it is common to have: Bruising around your puncture site. Tenderness around your puncture site. Skipped heartbeats. If you had an atrial fibrillation ablation, you may have atrial fibrillation during the first several months after your procedure.  Tiredness (fatigue).  Follow these instructions at home: Puncture site care  Follow instructions from your health care provider about how to take care of your puncture site. Make sure you: If present, leave stitches (sutures), skin glue, or adhesive strips in place. These skin closures may need to stay in place for up to 2 weeks. If adhesive strip edges start to loosen and curl up, you may trim the loose edges. Do not remove adhesive strips completely unless your health care provider tells you to do that. If a large square bandage is present, this may be removed 24 hours after surgery.  Check your puncture site every day for signs of infection. Check for: Redness, swelling, or pain. Fluid or blood. If your puncture site starts to bleed, lie down on your back, apply firm pressure to the area, and contact your health care provider. Dr Shaune [facility]. Pus or a bad smell. A pea or marble sized lump/knot at the site is normal and can take up to three months to resolve.  Driving Do not drive for at least 4 days after your procedure or however long your health care provider recommends. (Do not resume driving if you have previously been instructed not to drive for other health reasons.) Do not drive or use heavy machinery while taking prescription pain medicine. Activity Avoid activities that take a lot of  effort for at least 7 days after your procedure. Do not lift anything that is heavier than 5 lb (4.5 kg) for one week.  No sexual activity for 1 week.  Return to your normal activities as told by your health care provider. Ask your health care provider what activities are safe for you. General instructions Take over-the-counter and prescription medicines only as told by your health care provider. Do not use any products that contain nicotine  or tobacco, such as cigarettes and e-cigarettes. If you need help quitting, ask your health care provider. You may shower after 24 hours, but Do not take baths, swim, or use a hot tub for 1 week.  Do not drink alcohol  for 24 hours after your procedure. Keep all follow-up visits as told by your health care provider. This is important. Contact a health care provider if: You have redness, mild swelling, or pain around your puncture site. You have fluid or blood coming from your puncture site that stops after applying firm pressure to the area. Your puncture site feels warm to the touch. You have pus or a bad smell coming from your puncture site. You have a fever. You have chest pain or discomfort that spreads to your neck, jaw, or arm. You have chest pain that is worse with lying on your back or taking a deep breath. You are sweating a lot. You feel nauseous. You have a fast or irregular heartbeat. You have shortness of breath. You are dizzy or light-headed and feel the need to lie down. You have pain or numbness in  the arm or leg closest to your puncture site. Get help right away if: Your puncture site suddenly swells. Your puncture site is bleeding and the bleeding does not stop after applying firm pressure to the area. These symptoms may represent a serious problem that is an emergency. Do not wait to see if the symptoms will go away. Get medical help right away. Call your local emergency services (911 in the [place] Do not drive yourself to the  hospital. Summary After the procedure, it is normal to have bruising and tenderness at the puncture site in your groin, neck, or forearm. Check your puncture site every day for signs of infection. Get help right away if your puncture site is bleeding and the bleeding does not stop after applying firm pressure to the area. This is a medical emergency. This information is not intended to replace advice given to you by your health care provider. Make sure you discuss any questions you have with your health care provider.

## 2024-04-04 NOTE — Anesthesia Preprocedure Evaluation (Signed)
 Anesthesia Evaluation  Patient identified by MRN, date of birth, ID band Patient awake    Reviewed: Allergy & Precautions, NPO status , Patient's Chart, lab work & pertinent test results  History of Anesthesia Complications Negative for: history of anesthetic complications  Airway Mallampati: II  TM Distance: >3 FB Neck ROM: Full    Dental  (+) Chipped,    Pulmonary shortness of breath and with exertion, sleep apnea and Continuous Positive Airway Pressure Ventilation , former smoker   Pulmonary exam normal breath sounds clear to auscultation       Cardiovascular hypertension, Pt. on medications + CAD  Normal cardiovascular exam+ dysrhythmias Atrial Fibrillation  Rhythm:Regular Rate:Normal     Neuro/Psych  Headaches PSYCHIATRIC DISORDERS Anxiety Depression    negative neurological ROS     GI/Hepatic Neg liver ROS,GERD  Medicated,,  Endo/Other  diabetes, Type 2    Renal/GU Renal InsufficiencyRenal disease  negative genitourinary   Musculoskeletal  (+)  Fibromyalgia -  Abdominal Normal abdominal exam  (+)   Peds  Hematology negative hematology ROS (+)   Anesthesia Other Findings   Reproductive/Obstetrics negative OB ROS                              Anesthesia Physical Anesthesia Plan  ASA: 3  Anesthesia Plan: General   Post-op Pain Management:    Induction: Intravenous  PONV Risk Score and Plan: 2 and Ondansetron , Dexamethasone  and Treatment may vary due to age or medical condition  Airway Management Planned: Oral ETT  Additional Equipment: None  Intra-op Plan:   Post-operative Plan: Extubation in OR  Informed Consent: I have reviewed the patients History and Physical, chart, labs and discussed the procedure including the risks, benefits and alternatives for the proposed anesthesia with the patient or authorized representative who has indicated his/her understanding and  acceptance.     Dental Advisory Given  Plan Discussed with: CRNA  Anesthesia Plan Comments:         Anesthesia Quick Evaluation

## 2024-04-04 NOTE — Progress Notes (Signed)
 Patient and wife given discharge instructions and education. Patient and wife state they have no further questions at this time. MD Kennyth came to check on patient around 1530, all other questions answered,states if he tolerates ambulating well and voids, he can go home. Afterwards patient ambulated to bathroom, able to void. No bleeding or hematoma noted.

## 2024-04-06 ENCOUNTER — Ambulatory Visit: Payer: Self-pay | Admitting: Cardiology

## 2024-04-06 ENCOUNTER — Encounter (HOSPITAL_COMMUNITY): Payer: Self-pay | Admitting: Cardiology

## 2024-04-07 ENCOUNTER — Telehealth (HOSPITAL_COMMUNITY): Payer: Self-pay

## 2024-04-07 NOTE — Anesthesia Postprocedure Evaluation (Signed)
 Anesthesia Post Note  Patient: Jesse Vargas  Procedure(s) Performed: ATRIAL FIBRILLATION ABLATION     Patient location during evaluation: PACU Anesthesia Type: General Level of consciousness: awake and alert Pain management: pain level controlled Vital Signs Assessment: post-procedure vital signs reviewed and stable Respiratory status: spontaneous breathing, nonlabored ventilation, respiratory function stable and patient connected to nasal cannula oxygen Cardiovascular status: blood pressure returned to baseline and stable Postop Assessment: no apparent nausea or vomiting Anesthetic complications: no   No notable events documented.  Last Vitals:  Vitals:   04/04/24 1400 04/04/24 1506  BP: 112/66 110/70  Pulse: (!) 53 (!) 54  Resp: 14 12  Temp:    SpO2: 93% 93%    Last Pain:  Vitals:   04/04/24 1333  TempSrc:   PainSc: 3    Pain Goal:                   Kasaundra Fahrney L Jawuan Robb

## 2024-04-07 NOTE — Telephone Encounter (Signed)
 Spoke with patient to complete post procedure follow up call.  Patient reports no complications with groin sites.   Instructions reviewed with patient:  It is normal to have bruising, tenderness, mild swelling, and a pea or marble sized lump/knot at the groin site which can take up to three months to resolve.  Get help right away if you notice sudden swelling at the puncture site.  Check your puncture site every day for signs of infection: fever, redness, swelling, pus drainage, warmth, foul odor or excessive pain. If this occurs, please call [phone], to speak with the RN Navigator. Get help right away if your puncture site is bleeding and the bleeding does not stop after applying firm pressure to the area.  You may continue to have skipped beats/ atrial fibrillation during the first several months after your procedure.  It is very important not to miss any doses of your blood thinner Eliquis .    You will follow up with the APP on 05/05/24 and follow up with the APP on 06/27/24.   Patient verbalized understanding to all instructions provided.

## 2024-04-09 ENCOUNTER — Ambulatory Visit (INDEPENDENT_AMBULATORY_CARE_PROVIDER_SITE_OTHER): Admitting: Dermatology

## 2024-04-09 ENCOUNTER — Encounter: Payer: Self-pay | Admitting: Dermatology

## 2024-04-09 DIAGNOSIS — C44519 Basal cell carcinoma of skin of other part of trunk: Secondary | ICD-10-CM

## 2024-04-09 MED ORDER — MUPIROCIN 2 % EX OINT: 1.0000 | TOPICAL_OINTMENT | Freq: Every day | CUTANEOUS | 0 refills | Status: DC

## 2024-04-09 NOTE — Progress Notes (Signed)
   Follow-Up Visit   Subjective  Jesse Vargas is a 68 y.o. male who presents for the following: Excision of BCC at central upper chest  The following portions of the chart were reviewed this encounter and updated as appropriate: medications, allergies, medical history  Review of Systems:  No other skin or systemic complaints except as noted in HPI or Assessment and Plan.  Objective  Well appearing patient in no apparent distress; mood and affect are within normal limits.  A focused examination was performed of the following areas: chest Relevant physical exam findings are noted in the Assessment and Plan.   central upper chest Pink bx site  Assessment & Plan   BASAL CELL CARCINOMA (BCC) OF SKIN OF OTHER PART OF [place]ORSO central upper chest Skin excision  Excision method:  elliptical Lesion length (cm):  0.7 Margin per side (cm):  0.4 Total excision diameter (cm):  1.5 Informed consent: discussed and consent obtained   Timeout: patient name, date of birth, surgical site, and procedure verified   Procedure prep:  Patient was prepped and draped in usual sterile fashion Prep type:  Chlorhexidine Anesthesia: the lesion was anesthetized in a standard fashion   Anesthetic:  1% lidocaine  w/ epinephrine 1-100,000 buffered w/ 8.4% NaHCO3 (17 cc) Instrument used: #15 blade   Hemostasis achieved with: suture, pressure and electrodesiccation   Outcome: patient tolerated procedure well with no complications   Additional details:  Lateral tag  Skin repair Complexity:  Intermediate Final length (cm):  5.2 Informed consent: discussed and consent obtained   Timeout: patient name, date of birth, surgical site, and procedure verified   Procedure prep:  Patient was prepped and draped in usual sterile fashion Prep type:  Chlorhexidine Anesthesia: the lesion was anesthetized in a standard fashion   Anesthetic:  1% lidocaine  w/ epinephrine 1-100,000 buffered w/ 8.4% NaHCO3 Reason for type  of repair: reduce tension to allow closure, reduce the risk of dehiscence, infection, and necrosis, reduce subcutaneous dead space and avoid a hematoma, allow closure of the large defect and preserve normal anatomy   Undermining: edges could be approximated without difficulty   Subcutaneous layers (deep stitches):  Suture size:  4-0 Suture type: Monocryl (poliglecaprone 25)   Stitches:  Buried vertical mattress Fine/surface layer approximation (top stitches):  Suture size:  5-0 Suture type: Prolene (polypropylene)   Stitches: simple running   Suture removal (days):  7 Hemostasis achieved with: suture, pressure and electrodesiccation Outcome: patient tolerated procedure well with no complications   Post-procedure details: sterile dressing applied and wound care instructions given   Dressing type: petrolatum, bandage and pressure dressing    Related Procedures Anatomic Pathology Report   Return for Suture Removal, with Dr. Claudene, TB[facility], as scheduled.  LILLETTE Lonell Drones, RMA, am acting as scribe for Boneta Claudene, MD .   Documentation: I have reviewed the above documentation for accuracy and completeness, and I agree with the above.  Boneta Claudene, MD

## 2024-04-09 NOTE — Patient Instructions (Signed)

## 2024-04-15 ENCOUNTER — Encounter: Payer: Self-pay | Admitting: Internal Medicine

## 2024-04-15 DIAGNOSIS — E118 Type 2 diabetes mellitus with unspecified complications: Secondary | ICD-10-CM

## 2024-04-16 ENCOUNTER — Encounter: Payer: Self-pay | Admitting: Dermatology

## 2024-04-16 ENCOUNTER — Ambulatory Visit: Payer: Self-pay | Admitting: Dermatology

## 2024-04-16 LAB — ANATOMIC PATHOLOGY REPORT

## 2024-04-16 MED ORDER — MOUNJARO 12.5 MG/0.5ML ~~LOC~~ SOAJ: 12.5000 mg | SUBCUTANEOUS | 0 refills | Status: DC

## 2024-04-16 NOTE — Addendum Note (Signed)
 Addended by: DARRELL BRUCKNER on: 04/16/2024 03:26 PM   Modules accepted: Orders

## 2024-04-16 NOTE — Telephone Encounter (Signed)
 Spoke with patient and advised of results. Patient stated his wound is healing well and has been applying mupirocin  ointment as prescribed.

## 2024-04-16 NOTE — Telephone Encounter (Signed)
-----   Message from [place] sent at 04/16/2024  8:29 AM EDT ----- Diagnosis central upper chest ,Skin Biopsy: RESIDUAL BASAL  CELL CARCINOMA. MARGINS CLEAR.  CHANGES CONSISTENT WITH PREVIOUS SURGICAL PROCEDURE.   Please call to share that excision was clear of BCC and get update on surgical wound. Thank you. ----- Message ----- From: Rebecka Memos Lab Results In Sent: 04/16/2024   7:36 AM EDT To: Boneta Sharps, MD

## 2024-04-17 ENCOUNTER — Ambulatory Visit: Admitting: Dermatology

## 2024-04-17 ENCOUNTER — Encounter: Payer: Self-pay | Admitting: Dermatology

## 2024-04-17 DIAGNOSIS — Z1283 Encounter for screening for malignant neoplasm of skin: Secondary | ICD-10-CM | POA: Diagnosis not present

## 2024-04-17 DIAGNOSIS — Z7189 Other specified counseling: Secondary | ICD-10-CM

## 2024-04-17 DIAGNOSIS — W908XXA Exposure to other nonionizing radiation, initial encounter: Secondary | ICD-10-CM | POA: Diagnosis not present

## 2024-04-17 DIAGNOSIS — Z85828 Personal history of other malignant neoplasm of skin: Secondary | ICD-10-CM

## 2024-04-17 DIAGNOSIS — L814 Other melanin hyperpigmentation: Secondary | ICD-10-CM

## 2024-04-17 DIAGNOSIS — A630 Anogenital (venereal) warts: Secondary | ICD-10-CM | POA: Diagnosis not present

## 2024-04-17 DIAGNOSIS — L821 Other seborrheic keratosis: Secondary | ICD-10-CM

## 2024-04-17 DIAGNOSIS — L578 Other skin changes due to chronic exposure to nonionizing radiation: Secondary | ICD-10-CM | POA: Diagnosis not present

## 2024-04-17 DIAGNOSIS — Z48817 Encounter for surgical aftercare following surgery on the skin and subcutaneous tissue: Secondary | ICD-10-CM

## 2024-04-17 DIAGNOSIS — D1801 Hemangioma of skin and subcutaneous tissue: Secondary | ICD-10-CM

## 2024-04-17 DIAGNOSIS — D2261 Melanocytic nevi of right upper limb, including shoulder: Secondary | ICD-10-CM

## 2024-04-17 DIAGNOSIS — D229 Melanocytic nevi, unspecified: Secondary | ICD-10-CM

## 2024-04-17 NOTE — Progress Notes (Signed)
 Follow-Up Visit   Subjective  Jesse Vargas is a 68 y.o. male who presents for the following: Skin Cancer Screening and Full Body Skin Exam. Hx of BCC. Excised 04/09/2024. Removing sutures today.  Recheck groin area. Condyloma Tx with LN2 02/25/2024. States they have not gone away.   The patient presents for Total-Body Skin Exam (TBSE) for skin cancer screening and mole check. The patient has spots, moles and lesions to be evaluated, some may be new or changing and the patient may have concern these could be cancer.    The following portions of the chart were reviewed this encounter and updated as appropriate: medications, allergies, medical history  Review of Systems:  No other skin or systemic complaints except as noted in HPI or Assessment and Plan.  Objective  Well appearing patient in no apparent distress; mood and affect are within normal limits.  A full examination was performed including scalp, head, eyes, ears, nose, lips, neck, chest, axillae, abdomen, back, buttocks, bilateral upper extremities, bilateral lower extremities, hands, feet, fingers, toes, fingernails, and toenails. All findings within normal limits unless otherwise noted below.   Relevant physical exam findings are noted in the Assessment and Plan.         Left Inguinal Crease x11 (11) Verrucous papules   Assessment & Plan   SKIN CANCER SCREENING PERFORMED TODAY.  HISTORY OF BASAL CELL CARCINOMA OF THE SKIN. Central upper chest. Excised 04/09/2024. - No evidence of recurrence today - Recommend regular full body skin exams - Recommend daily broad spectrum sunscreen SPF 30+ to sun-exposed areas, reapply every 2 hours as needed.  - Call if any new or changing lesions are noted between office visits   ACTINIC DAMAGE - Chronic condition, secondary to cumulative UV/sun exposure - diffuse scaly erythematous macules with underlying dyspigmentation - Recommend daily broad spectrum sunscreen SPF 30+ to  sun-exposed areas, reapply every 2 hours as needed.  - Staying in the shade or wearing long sleeves, sun glasses (UVA+UVB protection) and wide brim hats (4-inch brim around the entire circumference of the hat) are also recommended for sun protection.  - Call for new or changing lesions.  LENTIGINES, SEBORRHEIC KERATOSES, HEMANGIOMAS - Benign normal skin lesions - Benign-appearing - Call for any changes  MELANOCYTIC NEVI - Tan-brown and/or pink-flesh-colored symmetric macules and papules - Benign appearing on exam today - Observation - Call clinic for new or changing moles - Recommend daily use of broad spectrum spf 30+ sunscreen to sun-exposed areas.    Encounter for Removal of Sutures - Incision site at the cental upper chest is clean, dry and intact - Wound cleansed, sutures removed, wound cleansed and bandage applied.  - Discussed pathology results showing residual BCC, margins free.  - Scars remodel for a full year. - Can apply over-the-counter silicone scar cream each night to help with scar remodeling if desired. - Patient advised to call with any concerns or if they notice any new or changing lesions.  MELANOCYTIC NEVUS Exam: 5 mm dark brown macule at R anterior shoulder. Photo taken today.  Treatment Plan: Benign appearing on exam today. Recommend observation. Call clinic for new or changing moles. Recommend daily use of broad spectrum spf 30+ sunscreen to sun-exposed areas.    CONDYLOMA (11) Left Inguinal Crease x11 (11) Viral Wart (HPV) Counseling  Discussed viral / HPV (Human Papilloma Virus) etiology and risk of spread /infectivity to other areas of body as well as to other people.  Multiple treatments and methods may be  required to clear warts and it is possible treatment may not be successful.  Treatment risks include discoloration; scarring and there is still potential for wart recurrence.  Destruction of lesion - Left Inguinal Crease x11 (11) Complexity: simple    Destruction method: cryotherapy   Informed consent: discussed and consent obtained   Timeout:  patient name, date of birth, surgical site, and procedure verified Lesion destroyed using liquid nitrogen: Yes   Region frozen until ice ball extended beyond lesion: Yes   Cryo cycles: 1 or 2. Outcome: patient tolerated procedure well with no complications   Post-procedure details: wound care instructions given   Additional details:  Prior to procedure, discussed risks of blister formation, small wound, skin dyspigmentation, or rare scar following cryotherapy. Recommend Vaseline ointment to treated areas while healing.   MULTIPLE BENIGN NEVI   LENTIGINES   ACTINIC ELASTOSIS   SEBORRHEIC KERATOSES   CHERRY ANGIOMA   Return in about 6 months (around 10/16/2024) for FBSE.  I, Jill Parcell, CMA, am acting as scribe for Boneta Sharps, MD.   Documentation: I have reviewed the above documentation for accuracy and completeness, and I agree with the above.  Boneta Sharps, MD

## 2024-04-17 NOTE — Patient Instructions (Addendum)
 Cryotherapy Aftercare  Wash gently with soap and water  everyday.   Apply Vaseline Jelly daily until healed.    Recommend daily broad spectrum sunscreen SPF 30+ to sun-exposed areas, reapply every 2 hours as needed. Call for new or changing lesions.  Staying in the shade or wearing long sleeves, sun glasses (UVA+UVB protection) and wide brim hats (4-inch brim around the entire circumference of the hat) are also recommended for sun protection.      Melanoma ABCDEs  Melanoma is the most dangerous type of skin cancer, and is the leading cause of death from skin disease.  You are more likely to develop melanoma if you: Have light-colored skin, light-colored eyes, or red or blond hair Spend a lot of time in the sun Tan regularly, either outdoors or in a tanning bed Have had blistering sunburns, especially during childhood Have a close family member who has had a melanoma Have atypical moles or large birthmarks  Early detection of melanoma is key since treatment is typically straightforward and cure rates are extremely high if we catch it early.   The first sign of melanoma is often a change in a mole or a new dark spot.  The ABCDE system is a way of remembering the signs of melanoma.  A for asymmetry:  The two halves do not match. B for border:  The edges of the growth are irregular. C for color:  A mixture of colors are present instead of an even brown color. D for diameter:  Melanomas are usually (but not always) greater than 6mm - the size of a pencil eraser. E for evolution:  The spot keeps changing in size, shape, and color.  Please check your skin once per month between visits. You can use a small mirror in front and a large mirror behind you to keep an eye on the back side or your body.   If you see any new or changing lesions before your next follow-up, please call to schedule a visit.  Please continue daily skin protection including broad spectrum sunscreen SPF 30+ to  sun-exposed areas, reapplying every 2 hours as needed when you're outdoors.   Staying in the shade or wearing long sleeves, sun glasses (UVA+UVB protection) and wide brim hats (4-inch brim around the entire circumference of the hat) are also recommended for sun protection.      Due to recent changes in healthcare laws, you may see results of your pathology and/or laboratory studies on MyChart before the doctors have had a chance to review them. We understand that in some cases there may be results that are confusing or concerning to you. Please understand that not all results are received at the same time and often the doctors may need to interpret multiple results in order to provide you with the [place] plan of care or course of treatment. Therefore, we ask that you please give us  2 business days to thoroughly review all your results before contacting the office for clarification. Should we see a critical lab result, you will be contacted sooner.   If You Need Anything After Your Visit  If you have any questions or concerns for your doctor, please call our main line at [phone] and press option 4 to reach your doctor's medical assistant. If no one answers, please leave a voicemail as directed and we will return your call as soon as possible. Messages left after 4 pm will be answered the following business day.   You may also send us   a message via MyChart. We typically respond to MyChart messages within 1-2 business days.  For prescription refills, please ask your pharmacy to contact our office. Our fax number is [phone].  If you have an urgent issue when the clinic is closed that cannot wait until the next business day, you can page your doctor at the number below.    Please note that while we do our [place] to be available for urgent issues outside of office hours, we are not available 24/7.   If you have an urgent issue and are unable to reach us , you may choose to seek medical care at  your doctor's office, retail clinic, urgent care center, or emergency room.  If you have a medical emergency, please immediately call 911 or go to the emergency department.  Pager Numbers  - Dr. Hester: [phone]  - Dr. Jackquline: [phone]  - Dr. Claudene: [phone]   - Dr. Raymund: [phone]  In the event of inclement weather, please call our main line at [phone] for an update on the status of any delays or closures.  Dermatology Medication Tips: Please keep the boxes that topical medications come in in order to help keep track of the instructions about where and how to use these. Pharmacies typically print the medication instructions only on the boxes and not directly on the medication tubes.   If your medication is too expensive, please contact our office at [phone] option 4 or send us  a message through MyChart.   We are unable to tell what your co-pay for medications will be in advance as this is different depending on your insurance coverage. However, we may be able to find a substitute medication at lower cost or fill out paperwork to get insurance to cover a needed medication.   If a prior authorization is required to get your medication covered by your insurance company, please allow us  1-2 business days to complete this process.  Drug prices often vary depending on where the prescription is filled and some pharmacies may offer cheaper prices.  The website www.goodrx.com contains coupons for medications through different pharmacies. The prices here do not account for what the cost may be with help from insurance (it may be cheaper with your insurance), but the website can give you the price if you did not use any insurance.  - You can print the associated coupon and take it with your prescription to the pharmacy.  - You may also stop by our office during regular business hours and pick up a GoodRx coupon card.  - If you need your prescription sent  electronically to a different pharmacy, notify our office through [facility] or by phone at [phone] option 4.     Si Usted Necesita Algo Despus de Su Visita  Tambin puede enviarnos un mensaje a travs de Clinical cytogeneticist. Por lo general respondemos a los mensajes de MyChart en el transcurso de 1 a 2 das hbiles.  Para renovar recetas, por favor pida a su farmacia que se ponga en contacto con nuestra oficina. Randi lakes de fax es [place] [phone].  Si tiene un asunto urgente cuando la clnica est cerrada y que no puede esperar hasta el siguiente da hbil, puede llamar/localizar a su doctor(a) al nmero que aparece a continuacin.   Por favor, tenga en cuenta que aunque hacemos todo lo posible para estar disponibles para asuntos urgentes fuera del horario de [place], no estamos disponibles las 24 horas del da, los 7 [address]  [address] de la [place].  Si tiene un problema urgente y no puede comunicarse con nosotros, puede optar por buscar atencin mdica  en el consultorio de su doctor(a), en una clnica privada, en un centro de atencin urgente o en una sala de emergencias.  Si tiene Engineer, drilling, por favor llame inmediatamente al 911 o vaya a la sala de emergencias.  Nmeros de bper  - Dr. Hester: [phone]  - Dra. Jackquline: [phone]  - Dr. Claudene: [phone]  - Dra. Kitts: [phone]  En caso de inclemencias del [place], por favor llame a nuestra lnea principal al [phone] para una actualizacin sobre el estado de cualquier retraso o cierre.  Consejos para la medicacin en dermatologa: Por favor, guarde las cajas en las que vienen los medicamentos de uso tpico para ayudarle a seguir las instrucciones sobre dnde y cmo usarlos. Las farmacias generalmente imprimen las instrucciones del medicamento slo en las cajas y no directamente en los tubos del [place].   Si su medicamento es muy caro, por favor, pngase en contacto con landry rieger llamando al  [phone] y presione la opcin 4 o envenos un mensaje a travs de Clinical cytogeneticist.   No podemos decirle cul ser su copago por los medicamentos por adelantado ya que esto es diferente dependiendo de la cobertura de su seguro. Sin embargo, es posible que podamos encontrar un medicamento sustituto a Audiological scientist un formulario para que el seguro cubra el medicamento que se considera necesario.   Si se requiere una autorizacin previa para que su compaa de seguros malta su medicamento, por favor permtanos de 1 a 2 das hbiles para completar este proceso.  Los precios de los medicamentos varan con frecuencia dependiendo del Environmental consultant de dnde se surte la receta y alguna farmacias pueden ofrecer precios ms baratos.  El sitio web www.goodrx.com tiene cupones para medicamentos de Health and safety inspector. Los precios aqu no tienen en cuenta lo que podra costar con la ayuda del seguro (puede ser ms barato con su seguro), pero el sitio web puede darle el precio si no utiliz Tourist information centre manager.  - Puede imprimir el cupn correspondiente y llevarlo con su receta a la farmacia.  - Tambin puede pasar por nuestra oficina durante el horario de atencin regular y Education officer, museum una tarjeta de cupones de GoodRx.  - Si necesita que su receta se enve electrnicamente a una farmacia diferente, informe a nuestra oficina a travs de MyChart de [place] o por telfono llamando al [phone] y presione la opcin 4.

## 2024-04-23 ENCOUNTER — Ambulatory Visit: Admitting: Dermatology

## 2024-04-24 NOTE — Progress Notes (Signed)
 [facility] Follow Up Visit   Patient Name: Jesse Vargas, male   Patient DOB: 06-12-1956 Date of Service: 04/24/2024  Patient MRN: [id number] Provider Creating Note: Jesse Brought, MD  [phone] Primary Care Physician: Jesse Lynwood FALCON, MD   [address] [place] [address] Additional Physicians/ Providers:   Impression/Recommendations   [name] is a 68 y.o. male with renal cell cancer status post left nephrectomy, hypertension, atrial fibrillation, COPD, coronary artery diease, nephrolithiasis and rheumatoid arthritis who presents for follow up. Last seen in clinic by Dr. Korrapati on 05/18/2021.  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   Nephrolithiasis and hematuria: - refer to urology  Hypertension, essential: 114/69 - current regimen of amlodipine , olmesartan and hydrochlorothiazide  - not currently on his carvedilol   Hyperlipidemia:  - current regimen of ezetimibe , fenofibrate  and icosapent  ethyl - patient is finding icosapent  ethyl to be cost prohibitive. We have asked him to let cardiology know.   Patient Active Problem List  Diagnosis  . Diabetes mellitus with renal manifestations, type II or unspecified type, not stated as uncontrolled (HCC)  . Benign essential hypertension  . Proteinuria  . Hyperlipidemia  . Nephrolithiasis    Orders Placed This Encounter  . CBC and Differential  . Kappa/Lambda free LT chains w/ratio, Serum  . Hepatitis B Core Antibody, Total  . Hepatitis B Surface Antigen  . Hepatitis C antibody  . Magnesium   . Protein electrophoresis, serum  . Protein Electrophoresis, Urine Random  . PTH, Intact  . Renal Function Panel  . Protein, Total, Random Urine  w/Creatinine (Protein/Creat Ratio)  . Ambulatory referral to Urology  . Dapagliflozin Propanediol (Farxiga) 5 MG tablet       Return in about 4 weeks (around 05/22/2024).  Chief Complaint   Chief Complaint  Patient presents with  . Follow-up    History of Present Illness   [name] presents for follow up. Patient was last seen in 2022 for proteinuria secondary to solitary kidney, diabetes and hypertension.   Patient presents for follow up after being evaluated by surgery for a left sided flank hernia at the site of his left nephrectomy. He underwent a CT of the abdomen and pelvis showing nephrolithasis with 5mm stone.   Patient recently underwent atrial fibrillation ablation by Dr. Kennyth at [facility]. Patient states he is doing well after this procedure.   Patient claims that a few days ago, he had gross hematuria and dysuria. He is unsure if he passed a stone.   Patient states that he is in his usual state of health. He is taking all his medications as prescribed. Patient denies use of nonsteroidal anti-inflammatory agents.       Medications   Current Outpatient Medications:  .  ALPRAZolam  XR (XANAX  XR) 2 MG 24 hr tablet, Take 2 mg by mouth 1 (one) time each day, Disp: , Rfl:  .  amiodarone  (PACERONE ) 200 MG tablet, Take 200 mg by mouth daily, Disp: , Rfl:  .  apixaban  (ELIQUIS ) 5 MG tablet, Take 5 mg by mouth in the morning and 5 mg in the evening., Disp: , Rfl:  .  FLUoxetine  (PROzac ) 20 MG capsule, Take 20 mg by mouth daily, Disp: , Rfl:  .  Magnesium  200 MG tablet, Take 200 mg by mouth in  the morning and 200 mg in the evening., Disp: , Rfl:  .  Tirzepatide  (Mounjaro ) 12.5 MG/0.5ML solution auto-injector, Inject 12.5 mg under the skin per week, Disp: , Rfl:  .  ACETAMINOPHEN  8 HOUR PO, Take by mouth if needed, Disp: , Rfl:  .  albuterol  HFA (PROVENTIL  HFA;VENTOLIN  HFA) 108 (90 Base) MCG/ACT inhaler, Inhale 2 puffs 4 (four) times a day if needed, Disp: , Rfl:  .   amLODIPine  (NORVASC ) 5 MG tablet, Take 1 tablet by mouth 1 (one) time each day, Disp: , Rfl:  .  atorvastatin  (LIPITOR) 40 MG tablet, Take 1 tablet by mouth 1 (one) time each day, Disp: , Rfl:  .  cyclobenzaprine  (FLEXERIL ) 5 MG tablet, Take 2.5 mg by mouth at bed time, Disp: , Rfl:  .  Dapagliflozin Propanediol (Farxiga) 5 MG tablet, Take 5 mg by mouth 1 (one) time each day in the morning, Disp: 30 tablet, Rfl: 11 .  ergocalciferol  1.25 MG (50000 UT) capsule, Take 1 capsule by mouth 1 (one) time per week, Disp: , Rfl:  .  ezetimibe  (ZETIA ) 10 MG tablet, Take 10 mg by mouth 1 (one) time each day, Disp: , Rfl:  .  fenofibrate  (TRIGLIDE ) 160 MG tablet, Take 1 tablet by mouth 1 (one) time each day, Disp: , Rfl:  .  fluticasone  (FLONASE ) 50 MCG/ACT nasal spray, Administer 2 sprays into affected nostril(s) 1 (one) time each day, Disp: , Rfl:  .  gabapentin  (NEURONTIN ) 300 MG capsule, Take 300 mg by mouth in the morning and 300 mg in the evening., Disp: , Rfl:  .  Icosapent  Ethyl 1 g capsule, Take 2 capsules by mouth in the morning and 2 capsules in the evening., Disp: , Rfl:  .  metFORMIN  (GLUCOPHAGE ) 1000 MG tablet, Take 1,000 mg by mouth in the morning and 1,000 mg in the evening. Take with meals., Disp: , Rfl:  .  mupirocin  (BACTROBAN ) 2 % ointment, Apply topically daily, Disp: , Rfl:  .  olmesartan-hydroCHLOROthiazide  (BENICAR HCT) 40-25 MG per tablet, Take 1 tablet by mouth 1 (one) time each day, Disp: , Rfl:  .  omeprazole  (PriLOSEC) 20 MG DR capsule, Take 1 capsule by mouth 1 (one) time each day, Disp: , Rfl:  .  tadalafil  (CIALIS ) 20 MG tablet, Take 20 mg by mouth if needed for erectile dysfunction, Disp: , Rfl:    Allergies Patient has no known allergies.  History Past Medical History:  Diagnosis Date  . Atrial fibrillation (HCC)   . Attention-deficit hyperactivity disorder   . Benign essential hypertension   . Chronic obstructive pulmonary disease (HCC)   . Coronary atherosclerosis of  unspecified type of vessel, native or graft   . Degeneration of intervertebral disc   . Diabetes mellitus with renal manifestations, type II or unspecified type, not stated as uncontrolled (HCC)   . Erectile dysfunction   . Fibromyalgia   . Gastroesophageal reflux disease   . Generalized anxiety disorder   . History of nephrectomy   . Hyperlipidemia   . Kidney cancer (HCC)   . Major depressive disorder   . Obstructive sleep apnea syndrome   . Proteinuria   . Rheumatoid arthritis [facility])     Past Surgical History:  Procedure Laterality Date  . AV NODE ABLATION    . CHOLECYSTECTOMY    . NASAL SINUS SURGERY    . NEPHRECTOMY Left 1999  . TONSILLECTOMY    . UVULOPALATOPHARYNGOPLASTY     Family History  Problem Relation Age of  Onset  . Heart disease Mother   . Hypertension Mother   . Diabetes Mother   . Diabetes Father    Social History   Tobacco Use  . Smoking status: Former    Types: Cigarettes  . Smokeless tobacco: Never  Substance Use Topics  . Alcohol use: Not Currently     Physical Exam  Vitals BP 114/69 (BP Location: Right upper arm, Patient Position: Standing)   Pulse 61   Temp 97.8 F   Ht 5' 9 (1.753 m)   Wt 202 lb 6.4 oz (91.8 kg)   SpO2 96%   BMI 29.89 kg/m   Vitals reviewed. Constitutional: He is oriented to person, place, and time. He appears well-developed.  HEENT:  Head: Normocephalic and atraumatic. Mouth/Throat: Oropharynx is clear and moist.  Eyes: Pupils are equal, round, and reactive to light.  Neck: Neck supple.  Cardiovascular:  Normal rate and regular rhythm.           Pulmonary/Chest: Effort normal and breath sounds normal.  Abdominal: Soft.  Neurological: He is alert and oriented to person, place, and time.  Skin: Skin is warm and dry.     Laboratory Studies      No lab exists for component: GFR      No lab exists for component: IRON SATURATION, TRANSSATPER          No lab exists for component: GLUCOSEUR,  BILIRUBINUR, SPECGRAV, RBCUR, LEUKOCYTESUR, NITRITE      No lab exists for component: CYCLOSPORITR     Jesse Brought, MD  [address] [place], [place]

## 2024-04-30 NOTE — Progress Notes (Unsigned)
   05/01/2024 2:17 PM   Jesse Vargas 08-26-1955 [id number]  Reason for visit: Follow up nephrolithiasis   HPI: 68 y.o. male, initial follow up with me today, previously seen by Dr. Penne in Apr 2024 Reports gross hematuria about 2-3 weeks ago, bright red blood Some associated penile pain with passage of clots, denies flank pain or other acute LUTS Self-limited after a day Did not witness stone passage or seem like a stone event 20-30-pack-year smoker, stopped in 1993 No family history of GU malignancies  Prior HPI: Hx of ED + delayed ejaculation   - on tadalafil  20mg  PRN   - previously on Lexapro    Hx of Left RCC   - s/p left nephrectomy  Hx of CAD s/p stent, chronic pain syndrome, fibromyalgia, HTN, HLD, DM2    Physical Exam: BP (!) 149/83   Pulse (!) 59    Constitutional:  Alert and oriented, No acute distress.  Laboratory Data: Cr. 1.18, GFR 68 (Sept 2025)  Pertinent Imaging: I have personally viewed and interpreted the CT A/P non-con (Mar 2025, outside film) -6 mm right lower pole renal stone, no hydronephrosis, 2 cm right lower pole lesion, likely simple renal cyst (17 HU).  Absent left kidney.    Assessment & Plan:    Nephrolithiasis -     Urinalysis, Complete -     CT HEMATURIA WORKUP; Future  Gross hematuria Assessment & Plan: Acute GH to 2-3 weeks ago, now resolved History of nephrolithiasis (known 6 mm RLP renal stone)  History of solitary right kidney, surgically absent left kidney (prior RCC nephrectomy)   Reviewed his clinical history and CT scan from March 2025.  Unclear explanation of his acute gross hematuria, although it largely seemed isolated without obvious UTI symptoms or renal colic.  Considering his smoking history, we will proceed with standard GH workup with CT urogram and diagnostic cystoscopy /cytology.   - CT urogram +office cystoscopy /cytology - submit UA today, reflex culture   Orders: -     CT HEMATURIA WORKUP;  Future       Penne JONELLE Skye, MD  [facility] [address], [address] [place], [place] [address] [phone]

## 2024-05-01 ENCOUNTER — Ambulatory Visit (INDEPENDENT_AMBULATORY_CARE_PROVIDER_SITE_OTHER): Admitting: Urology

## 2024-05-01 ENCOUNTER — Encounter: Payer: Self-pay | Admitting: Urology

## 2024-05-01 VITALS — BP 149/83 | HR 59

## 2024-05-01 DIAGNOSIS — N200 Calculus of kidney: Secondary | ICD-10-CM | POA: Diagnosis not present

## 2024-05-01 DIAGNOSIS — R310 Gross hematuria: Secondary | ICD-10-CM | POA: Diagnosis not present

## 2024-05-01 LAB — URINALYSIS, COMPLETE
Bilirubin, UA: NEGATIVE
Glucose, UA: NEGATIVE
Leukocytes,UA: NEGATIVE
Nitrite, UA: NEGATIVE
Specific Gravity, UA: 1.025 (ref 1.005–1.030)
Urobilinogen, Ur: 0.2 mg/dL (ref 0.2–1.0)
pH, UA: 6 (ref 5.0–7.5)

## 2024-05-01 LAB — MICROSCOPIC EXAMINATION
Epithelial Cells (non renal): >10 /HPF — AB (ref 0–10)
RBC, Urine: >30 /HPF — AB (ref 0–2)

## 2024-05-01 NOTE — Patient Instructions (Addendum)
 Please contact Central Scheduling to set up your CT at [phone].   Cystoscopy Cystoscopy is a procedure that is used to help diagnose and sometimes treat conditions that affect the lower urinary tract. The lower urinary tract includes the bladder and the urethra. The urethra is the tube that drains urine from the bladder. Cystoscopy is done using a thin, tube-shaped instrument with a light and camera at the end (cystoscope). The cystoscope may be hard or flexible, depending on the goal of the procedure. The cystoscope is inserted through the urethra, into the bladder. Cystoscopy may be recommended if you have: Urinary tract infections that keep coming back. Blood in the urine (hematuria). An inability to control when you urinate (urinary incontinence) or an overactive bladder. Unusual cells found in a urine sample. A blockage in the urethra, such as a urinary stone. Painful urination. An abnormality in the bladder found during an intravenous pyelogram (IVP) or CT scan. What are the risks? Generally, this is a safe procedure. However, problems may occur, including: Infection. Bleeding.  What happens during the procedure?  You will be given one or more of the following: A medicine to numb the area (local anesthetic). The area around the opening of your urethra will be cleaned. The cystoscope will be passed through your urethra into your bladder. Germ-free (sterile) fluid will flow through the cystoscope to fill your bladder. The fluid will stretch your bladder so that your health care provider can clearly examine your bladder walls. Your doctor will look at the urethra and bladder. The cystoscope will be removed The procedure may vary among health care providers  What can I expect after the procedure? After the procedure, it is common to have: Some soreness or pain in your urethra. Urinary symptoms. These include: Mild pain or burning when you urinate. Pain should stop within a few  minutes after you urinate. This may last for up to a few days after the procedure. A small amount of blood in your urine for several days. Feeling like you need to urinate but producing only a small amount of urine. Follow these instructions at home: General instructions Return to your normal activities as told by your health care provider.  Drink plenty of fluids after the procedure. Keep all follow-up visits as told by your health care provider. This is important. Contact a health care provider if you: Have pain that gets worse or does not get better with medicine, especially pain when you urinate lasting longer than 72 hours after the procedure. Have trouble urinating. Get help right away if you: Have blood clots in your urine. Have a fever or chills. Are unable to urinate. Summary Cystoscopy is a procedure that is used to help diagnose and sometimes treat conditions that affect the lower urinary tract. Cystoscopy is done using a thin, tube-shaped instrument with a light and camera at the end. After the procedure, it is common to have some soreness or pain in your urethra. It is normal to have blood in your urine after the procedure.  If you were prescribed an antibiotic medicine, take it as told by your health care provider.  This information is not intended to replace advice given to you by your health care provider. Make sure you discuss any questions you have with your health care provider. Document Revised: 06/18/2018 Document Reviewed: 06/18/2018 Elsevier Patient Education  2020 [employer].

## 2024-05-01 NOTE — Assessment & Plan Note (Signed)
 Acute GH to 2-3 weeks ago, now resolved History of nephrolithiasis (known 6 mm RLP renal stone)  History of solitary right kidney, surgically absent left kidney (prior RCC nephrectomy)   Reviewed his clinical history and CT scan from March 2025.  Unclear explanation of his acute gross hematuria, although it largely seemed isolated without obvious UTI symptoms or renal colic.  Considering his smoking history, we will proceed with standard GH workup with CT urogram and diagnostic cystoscopy /cytology.   - CT urogram +office cystoscopy /cytology - submit UA today, reflex culture

## 2024-05-05 ENCOUNTER — Other Ambulatory Visit: Payer: Self-pay | Admitting: Internal Medicine

## 2024-05-05 ENCOUNTER — Ambulatory Visit: Admitting: Cardiology

## 2024-05-06 NOTE — Telephone Encounter (Signed)
 Prescription refill request for Eliquis  received. Indication:afib Last office visit:7/25 Scr:1.18  9/25 Age: 68 Weight:88.5  kg  Prescription refilled

## 2024-05-09 ENCOUNTER — Ambulatory Visit
Admission: RE | Admit: 2024-05-09 | Discharge: 2024-05-09 | Disposition: A | Discharge status: Home / Self Care | Source: Ambulatory Visit | Attending: Urology | Admitting: Urology

## 2024-05-09 DIAGNOSIS — N200 Calculus of kidney: Secondary | ICD-10-CM | POA: Diagnosis present

## 2024-05-09 DIAGNOSIS — R310 Gross hematuria: Secondary | ICD-10-CM | POA: Insufficient documentation

## 2024-05-09 MED ORDER — IOHEXOL 300 MG/ML  SOLN
100.0000 mL | Freq: Once | INTRAMUSCULAR | Status: AC | PRN
  Administered 2024-05-09: 100 mL via INTRAVENOUS

## 2024-05-12 ENCOUNTER — Other Ambulatory Visit: Payer: Self-pay

## 2024-05-12 DIAGNOSIS — R310 Gross hematuria: Secondary | ICD-10-CM

## 2024-05-16 ENCOUNTER — Other Ambulatory Visit: Admitting: Urology

## 2024-05-26 ENCOUNTER — Other Ambulatory Visit: Payer: Self-pay | Admitting: Internal Medicine

## 2024-05-26 DIAGNOSIS — E118 Type 2 diabetes mellitus with unspecified complications: Secondary | ICD-10-CM

## 2024-05-26 NOTE — Progress Notes (Unsigned)
   05/29/2024 7:43 AM   Jesse Vargas [birth year] [id number]  Cystoscopy Procedure Note:  Indication:  Acute GH to 2-3 weeks ago, now resolved History of nephrolithiasis (known 6 mm RLP renal stone)  History of solitary right kidney, surgically absent left kidney (prior RCC nephrectomy)  After informed consent and discussion of the procedure and its risks, Jesse Vargas was positioned and prepped in the standard fashion. Cystoscopy was performed with a flexible cystoscope. The urethra, bladder neck and bladder mucosa were visualized in a systematic fashion. The ureteral orifices were noted in orthotopic location and orientation. There were no bladder mucosal lesions, stones, debris or anatomic variants noted. The prostate gland was {[id number]}.  Barbotage cytology via cystoscope collected ***  Imaging: Recent CTU  (05/09/24) reviewed -   IMPRESSION: 1. There are at least 3 nonobstructing calculi in the right kidney with largest measuring up to 5 x 8 mm. No right hydroureteronephrosis. No suspicious renal, ureteric or urinary bladder mass. 2. No metastatic disease identified within the abdomen or pelvis. 3. Multiple other nonacute observations, as described above.  Findings: ***  Assessment and Plan: ***  Jesse Skye, MD 05/26/2024

## 2024-05-26 NOTE — Progress Notes (Unsigned)
 Electrophysiology Clinic Note    Date:  05/27/2024  Patient ID:  Calven, Gilkes [birth year], MRN [id number] PCP:  Valora Lynwood FALCON, MD  Cardiologist:  Vina Gull, MD  Cardiology APP:  Gerard Frederick, NP  Electrophysiologist:  Fonda Kitty, MD  Electrophysiology APP:  Rylie Knierim, NP    Discussed the use of AI scribe software for clinical note transcription with the patient, who gave verbal consent to proceed.   Patient Profile    Chief Complaint: AF ablation  History of Present Illness: RIVAN SIORDIA is a 68 y.o. male with PMH notable for persis afib, SVT s/p AVNRT ablation, CAD s/p PCI, HTN, OSA, T2DM, fibromyalgia; seen today for Fonda Kitty, MD for routine electrophysiology follow-up s/p AF Ablation.  He is s/p remote EPS w RF ablation of AVNRT 2015 by Dr. Waddell.  He is s/p AF ablation w isolation of pulm veins on 03/2024 by Dr. Kitty.    On follow-up today, he is not having any atrial fibrillation episodes since his ablation.  He states bilateral groin sites are completely healed without tenderness to palpation, swelling, bruising.  He has stopped amiodarone . He checks BP regularly at home, and prior to kidney stone readings were 110-120 systolic He continues to take eliquis  BID.  He is having back pain and groin pain from a kidney stone. Is actively following with urology, planning for cysto later this week.       Arrhythmia/Device History Amiodarone      ROS:  Please see the history of present illness. All other systems are reviewed and otherwise negative.    Physical Exam    VS:  BP (!) 146/88 (BP Location: Left Arm, Patient Position: Sitting, Cuff Size: Normal)   Pulse 63   Ht 5' 9 (1.753 m)   Wt 193 lb 3.2 oz (87.6 kg)   SpO2 96%   BMI 28.53 kg/m  BMI: Body mass index is 28.53 kg/m.           Wt Readings from Last 3 Encounters:  05/27/24 193 lb 3.2 oz (87.6 kg)  04/04/24 195 lb (88.5 kg)  01/23/24 200 lb (90.7 kg)     GEN-  The patient is well appearing, alert and oriented x 3 today.   Lungs- Clear to ausculation bilaterally, normal work of breathing.  Heart- Regular rate and rhythm, 3/6 murmur appreciated, rubs or gallops Extremities- Trace peripheral edema, warm, dry   Studies Reviewed   Previous EP, cardiology notes.    EKG is ordered. Personal review of EKG from today shows:     EKG Interpretation Date/Time:  Tuesday May 27 2024 14:10:04 EST Ventricular Rate:  63 PR Interval:  150 QRS Duration:  94 QT Interval:  454 QTC Calculation: 464 R Axis:   63  Text Interpretation: Normal sinus rhythm Normal ECG Confirmed by Ruweyda Macknight [phone]) on 05/27/2024 2:18:29 PM    TTE, 11/28/2023  1. Left ventricular ejection fraction, by estimation, is 60 to 65%. The left ventricle has normal function. The left ventricle has no regional wall motion abnormalities. Left ventricular diastolic parameters are indeterminate.   2. Right ventricular systolic function is normal. The right ventricular size is normal. There is normal pulmonary artery systolic pressure. The estimated right ventricular systolic pressure is 24.4 mmHg.   3. The mitral valve is normal in structure. Mild to moderate mitral valve regurgitation. No evidence of mitral stenosis.   4. The aortic valve is normal in structure. Aortic valve regurgitation is not  visualized. Aortic valve sclerosis is present, with no evidence of aortic valve stenosis.   5. The inferior vena cava is normal in size with greater than 50% respiratory variability, suggesting right atrial pressure of 3 mmHg.   6. Rhythm suggestive of atrial fibrillation    Assessment and Plan     #) persis AFib S/p AF ablation 03/2024 without recurrence Stop amiodarone  today  #) Hypercoag d/t afib CHA2DS2-VASc Score = at least 4 [CHF History: 0, HTN History: 1, Diabetes History: 1, Stroke History: 0, Vascular Disease History: 1, Age Score: 1, Gender Score: 0].  Therefore, the patient's  annual risk of stroke is 4.8 %.    Stroke ppx - 5mg  eliquis  BID, appropriately dosed No significant bleeding concerns   #) HTN Elevated in office today, likely d/t kidney stone Continue olmesartain-hydrochlorothiazide , amlodipine       Current medicines are reviewed at length with the patient today.   The patient does not have concerns regarding his medicines.  The following changes were made today:   STOP amiodarone   Labs/ tests ordered today include:  Orders Placed This Encounter  Procedures   EKG 12-Lead     Disposition: Follow up with Dr. Kennyth or EP APP in about 6 weeks    Signed, Metha Kolasa, NP  05/27/24  2:39 PM  Electrophysiology [facility]

## 2024-05-27 ENCOUNTER — Ambulatory Visit: Attending: Cardiology | Admitting: Cardiology

## 2024-05-27 ENCOUNTER — Encounter: Payer: Self-pay | Admitting: Cardiology

## 2024-05-27 VITALS — BP 146/88 | HR 63 | Ht 69.0 in | Wt 193.2 lb

## 2024-05-27 DIAGNOSIS — I4819 Other persistent atrial fibrillation: Secondary | ICD-10-CM

## 2024-05-27 DIAGNOSIS — I10 Essential (primary) hypertension: Secondary | ICD-10-CM

## 2024-05-27 DIAGNOSIS — D6869 Other thrombophilia: Secondary | ICD-10-CM

## 2024-05-27 DIAGNOSIS — G4733 Obstructive sleep apnea (adult) (pediatric): Secondary | ICD-10-CM

## 2024-05-27 NOTE — Patient Instructions (Addendum)
 Medication Instructions:  Your physician recommends the following medication changes.  STOP TAKING: Amiodarone   *If you need a refill on your cardiac medications before your next appointment, please call your pharmacy*  Lab Work: No labs ordered today  If you have labs (blood work) drawn today and your tests are completely normal, you will receive your results only by: MyChart Message (if you have MyChart) OR A paper copy in the mail If you have any lab test that is abnormal or we need to change your treatment, we will call you to review the results.  Testing/Procedures: No test ordered today   Follow-Up: At [facility], you and your health needs are our priority.  As part of our continuing mission to provide you with exceptional heart care, our providers are all part of one team.  This team includes your primary Cardiologist (physician) and Advanced Practice Providers or APPs (Physician Assistants and Nurse Practitioners) who all work together to provide you with the care you need, when you need it.  Your next appointment:   2 month(s)  Provider:   Zafir Schauer, NP

## 2024-05-28 ENCOUNTER — Encounter: Payer: Self-pay | Admitting: Cardiology

## 2024-05-29 ENCOUNTER — Ambulatory Visit (INDEPENDENT_AMBULATORY_CARE_PROVIDER_SITE_OTHER): Admitting: Urology

## 2024-05-29 ENCOUNTER — Encounter: Payer: Self-pay | Admitting: Urology

## 2024-05-29 VITALS — BP 153/87 | HR 60 | Ht 69.0 in | Wt 193.0 lb

## 2024-05-29 DIAGNOSIS — N200 Calculus of kidney: Secondary | ICD-10-CM

## 2024-05-29 NOTE — Patient Instructions (Signed)
 [phone].

## 2024-06-11 LAB — CYTOLOGY PLUS MONITORING PROFILE: PAP & FEULGEN

## 2024-06-20 ENCOUNTER — Ambulatory Visit
Admission: RE | Admit: 2024-06-20 | Discharge: 2024-06-20 | Disposition: A | Source: Ambulatory Visit | Attending: Urology

## 2024-06-20 DIAGNOSIS — N200 Calculus of kidney: Secondary | ICD-10-CM

## 2024-06-25 ENCOUNTER — Other Ambulatory Visit: Payer: Self-pay | Admitting: Cardiology

## 2024-06-26 ENCOUNTER — Ambulatory Visit: Admitting: Cardiology

## 2024-06-27 ENCOUNTER — Ambulatory Visit: Admitting: Cardiology

## 2024-07-01 ENCOUNTER — Other Ambulatory Visit: Payer: Self-pay | Admitting: Internal Medicine

## 2024-07-01 DIAGNOSIS — E118 Type 2 diabetes mellitus with unspecified complications: Secondary | ICD-10-CM

## 2024-08-14 ENCOUNTER — Ambulatory Visit: Admitting: Cardiology

## 2024-08-14 ENCOUNTER — Encounter: Payer: Self-pay | Admitting: Cardiology

## 2024-08-14 VITALS — BP 122/66 | HR 64 | Ht 69.0 in | Wt 192.8 lb

## 2024-08-14 DIAGNOSIS — I4819 Other persistent atrial fibrillation: Secondary | ICD-10-CM

## 2024-08-14 NOTE — Patient Instructions (Signed)
 Medication Instructions:  Your physician recommends that you continue on your current medications as directed. Please refer to the Current Medication list given to you today.  *If you need a refill on your cardiac medications before your next appointment, please call your pharmacy*  Lab Work: No labs ordered today    Testing/Procedures: No test ordered today   Follow-Up: At [facility], you and your health needs are our priority.  As part of our continuing mission to provide you with exceptional heart care, our providers are all part of one team.  This team includes your primary Cardiologist (physician) and Advanced Practice Providers or APPs (Physician Assistants and Nurse Practitioners) who all work together to provide you with the care you need, when you need it.  Your next appointment:   6 month(s)  Provider:   Tylene Lunch, NP     Electrophysiology follow ups as needed.

## 2024-08-14 NOTE — Progress Notes (Signed)
 "     Electrophysiology Clinic Note    Date:  08/14/2024  Patient ID:  Jesse Vargas 05-23-1956, MRN [id number] PCP:  Valora Lynwood FALCON, MD  Cardiologist:  Vina Gull, MD  Cardiology APP:  Gerard Frederick, NP  Electrophysiologist:  Fonda Kitty, MD  Electrophysiology APP:  Jett Fukuda, NP    Discussed the use of AI scribe software for clinical note transcription with the patient, who gave verbal consent to proceed.   Patient Profile    Chief Complaint: AF ablation  History of Present Illness: Jesse Vargas is a 69 y.o. male with PMH notable for persis afib, SVT s/p AVNRT ablation, CAD s/p PCI, HTN, OSA, T2DM, fibromyalgia; seen today for Fonda Kitty, MD for routine electrophysiology follow-up s/p AF Ablation.  He is s/p remote EPS w RF ablation of AVNRT 2015 by Dr. Waddell.  He is s/p AF ablation w isolation of pulm veins on 03/2024 by Dr. Kitty.   I last saw him 05/2024 for routine postablation follow-up.  He had not had any A-fib since ablation and had stopped his amiodarone  prior to the appointment.  On follow-up today, he has not had any AF recurrence since ablation. He uses apple watch to monitor, wears it daily but charges it the entire night. No AF on watch.   He continues to take eliquis  BID, no bleeding concerns.     Arrhythmia/Device History Amiodarone  - stopped after ablation    ROS:  Please see the history of present illness. All other systems are reviewed and otherwise negative.    Physical Exam    VS:  BP 122/66 (BP Location: Left Arm, Patient Position: Sitting, Cuff Size: Normal)   Pulse 64   Ht 5' 9 (1.753 m)   Wt 192 lb 12.8 oz (87.5 kg)   SpO2 98%   BMI 28.47 kg/m  BMI: Body mass index is 28.47 kg/m.           Wt Readings from Last 3 Encounters:  08/14/24 192 lb 12.8 oz (87.5 kg)  05/29/24 193 lb (87.5 kg)  05/27/24 193 lb 3.2 oz (87.6 kg)     GEN- The patient is well appearing, alert and oriented x 3 today.   Lungs- Clear  to ausculation bilaterally, normal work of breathing.  Heart- Regular rate and rhythm, 3/6 murmur appreciated, rubs or gallops Extremities- Trace peripheral edema, warm, dry   Studies Reviewed   Previous EP, cardiology notes.    EKG is ordered. Personal review of EKG from today shows:     EKG Interpretation Date/Time:  Thursday August 14 2024 14:53:20 EST Ventricular Rate:  64 PR Interval:  158 QRS Duration:  90 QT Interval:  440 QTC Calculation: 453 R Axis:   73  Text Interpretation: Normal sinus rhythm Normal ECG Confirmed by Meliton Samad [phone]) on 08/14/2024 2:58:16 PM    TTE, 11/28/2023  1. Left ventricular ejection fraction, by estimation, is 60 to 65%. The left ventricle has normal function. The left ventricle has no regional wall motion abnormalities. Left ventricular diastolic parameters are indeterminate.   2. Right ventricular systolic function is normal. The right ventricular size is normal. There is normal pulmonary artery systolic pressure. The estimated right ventricular systolic pressure is 24.4 mmHg.   3. The mitral valve is normal in structure. Mild to moderate mitral valve regurgitation. No evidence of mitral stenosis.   4. The aortic valve is normal in structure. Aortic valve regurgitation is not visualized. Aortic valve sclerosis is present,  with no evidence of aortic valve stenosis.   5. The inferior vena cava is normal in size with greater than 50% respiratory variability, suggesting right atrial pressure of 3 mmHg.   6. Rhythm suggestive of atrial fibrillation    Assessment and Plan     #) persis AFib S/p AF ablation 03/2024 by Dr. Kennyth Maintaining sinus rhythm off amiodarone  Continue to monitor AF burden via apple watch  #) Hypercoag d/t afib CHA2DS2-VASc Score = at least 4 [CHF History: 0, HTN History: 1, Diabetes History: 1, Stroke History: 0, Vascular Disease History: 1, Age Score: 1, Gender Score: 0].  Therefore, the patient's annual risk of  stroke is 4.8 %.    Stroke ppx - 5mg  eliquis  BID, appropriately dosed No significant bleeding concerns At this time, patient prefers to continue eliquis       Current medicines are reviewed at length with the patient today.   The patient does not have concerns regarding his medicines.  The following changes were made today:   none  Labs/ tests ordered today include:  Orders Placed This Encounter  Procedures   EKG 12-Lead     Disposition: Follow up with Dr. Kennyth or EP APP as needed    Follow-up with general cardiology in ~6 months  Signed, Candas Deemer, NP  08/14/24  4:06 PM  Electrophysiology [facility] "

## 2024-10-16 ENCOUNTER — Ambulatory Visit: Admitting: Dermatology

## 2025-05-28 ENCOUNTER — Ambulatory Visit: Admitting: Urology
